# Patient Record
Sex: Female | Born: 1965 | Race: White | Hispanic: No | Marital: Married | Smoking: Never smoker
Health system: Southern US, Community
[De-identification: ages and names within clinical notes are randomized; demographics above are authoritative.]

## PROBLEM LIST (undated history)

## (undated) DIAGNOSIS — Z8739 Personal history of other diseases of the musculoskeletal system and connective tissue: Secondary | ICD-10-CM

## (undated) DIAGNOSIS — J45909 Unspecified asthma, uncomplicated: Secondary | ICD-10-CM

## (undated) DIAGNOSIS — E559 Vitamin D deficiency, unspecified: Secondary | ICD-10-CM

## (undated) DIAGNOSIS — R0602 Shortness of breath: Secondary | ICD-10-CM

## (undated) DIAGNOSIS — I10 Essential (primary) hypertension: Secondary | ICD-10-CM

## (undated) DIAGNOSIS — D649 Anemia, unspecified: Secondary | ICD-10-CM

## (undated) DIAGNOSIS — G4730 Sleep apnea, unspecified: Secondary | ICD-10-CM

## (undated) DIAGNOSIS — K219 Gastro-esophageal reflux disease without esophagitis: Secondary | ICD-10-CM

## (undated) DIAGNOSIS — K589 Irritable bowel syndrome without diarrhea: Secondary | ICD-10-CM

## (undated) DIAGNOSIS — M2550 Pain in unspecified joint: Secondary | ICD-10-CM

## (undated) DIAGNOSIS — E538 Deficiency of other specified B group vitamins: Secondary | ICD-10-CM

## (undated) DIAGNOSIS — K829 Disease of gallbladder, unspecified: Secondary | ICD-10-CM

## (undated) DIAGNOSIS — E669 Obesity, unspecified: Secondary | ICD-10-CM

## (undated) DIAGNOSIS — J349 Unspecified disorder of nose and nasal sinuses: Secondary | ICD-10-CM

## (undated) DIAGNOSIS — F419 Anxiety disorder, unspecified: Secondary | ICD-10-CM

## (undated) DIAGNOSIS — F32A Depression, unspecified: Secondary | ICD-10-CM

## (undated) DIAGNOSIS — M549 Dorsalgia, unspecified: Secondary | ICD-10-CM

## (undated) DIAGNOSIS — E7800 Pure hypercholesterolemia, unspecified: Secondary | ICD-10-CM

## (undated) DIAGNOSIS — M7989 Other specified soft tissue disorders: Secondary | ICD-10-CM

## (undated) HISTORY — PX: TONSILLECTOMY: SUR1361

## (undated) HISTORY — DX: Disease of gallbladder, unspecified: K82.9

## (undated) HISTORY — PX: CHOLECYSTECTOMY: SHX55

## (undated) HISTORY — DX: Irritable bowel syndrome, unspecified: K58.9

## (undated) HISTORY — DX: Gastro-esophageal reflux disease without esophagitis: K21.9

## (undated) HISTORY — DX: Obesity, unspecified: E66.9

## (undated) HISTORY — DX: Personal history of other diseases of the musculoskeletal system and connective tissue: Z87.39

## (undated) HISTORY — DX: Unspecified disorder of nose and nasal sinuses: J34.9

## (undated) HISTORY — DX: Essential (primary) hypertension: I10

## (undated) HISTORY — DX: Sleep apnea, unspecified: G47.30

## (undated) HISTORY — DX: Pain in unspecified joint: M25.50

## (undated) HISTORY — DX: Deficiency of other specified B group vitamins: E53.8

## (undated) HISTORY — DX: Unspecified asthma, uncomplicated: J45.909

## (undated) HISTORY — DX: Pure hypercholesterolemia, unspecified: E78.00

## (undated) HISTORY — DX: Anemia, unspecified: D64.9

## (undated) HISTORY — DX: Depression, unspecified: F32.A

## (undated) HISTORY — DX: Vitamin D deficiency, unspecified: E55.9

## (undated) HISTORY — DX: Dorsalgia, unspecified: M54.9

## (undated) HISTORY — DX: Shortness of breath: R06.02

## (undated) HISTORY — DX: Other specified soft tissue disorders: M79.89

## (undated) HISTORY — DX: Anxiety disorder, unspecified: F41.9

---

## 2005-02-04 ENCOUNTER — Other Ambulatory Visit: Payer: Self-pay

## 2005-02-04 ENCOUNTER — Emergency Department: Payer: Self-pay | Admitting: Emergency Medicine

## 2005-05-07 ENCOUNTER — Ambulatory Visit: Payer: Self-pay | Admitting: Family Medicine

## 2005-11-10 ENCOUNTER — Emergency Department: Payer: Self-pay | Admitting: Emergency Medicine

## 2006-07-29 HISTORY — PX: ABDOMINAL HYSTERECTOMY: SHX81

## 2007-04-08 ENCOUNTER — Ambulatory Visit: Payer: Self-pay | Admitting: Unknown Physician Specialty

## 2007-04-13 ENCOUNTER — Ambulatory Visit: Payer: Self-pay | Admitting: Unknown Physician Specialty

## 2007-10-30 ENCOUNTER — Ambulatory Visit: Payer: Self-pay | Admitting: Unknown Physician Specialty

## 2008-02-15 ENCOUNTER — Emergency Department: Payer: Self-pay | Admitting: Emergency Medicine

## 2008-11-17 ENCOUNTER — Emergency Department: Payer: Self-pay | Admitting: Emergency Medicine

## 2011-05-08 ENCOUNTER — Ambulatory Visit (INDEPENDENT_AMBULATORY_CARE_PROVIDER_SITE_OTHER): Payer: 59

## 2011-05-08 ENCOUNTER — Inpatient Hospital Stay (INDEPENDENT_AMBULATORY_CARE_PROVIDER_SITE_OTHER)
Admission: RE | Admit: 2011-05-08 | Discharge: 2011-05-08 | Disposition: A | Discharge status: Home / Self Care | Payer: 59 | Source: Ambulatory Visit | Attending: Family Medicine | Admitting: Family Medicine

## 2011-05-08 DIAGNOSIS — R0789 Other chest pain: Secondary | ICD-10-CM

## 2011-05-08 DIAGNOSIS — R319 Hematuria, unspecified: Secondary | ICD-10-CM

## 2011-05-08 LAB — POCT I-STAT, CHEM 8
BUN: 8 mg/dL (ref 6–23)
Calcium, Ion: 1.21 mmol/L (ref 1.12–1.32)
Hemoglobin: 16 g/dL — ABNORMAL HIGH (ref 12.0–15.0)
TCO2: 27 mmol/L (ref 0–100)

## 2011-05-08 LAB — POCT URINALYSIS DIP (DEVICE)
Ketones, ur: NEGATIVE mg/dL
Leukocytes, UA: NEGATIVE
Nitrite: NEGATIVE
Protein, ur: NEGATIVE mg/dL
Urobilinogen, UA: 0.2 mg/dL (ref 0.0–1.0)
pH: 5.5 (ref 5.0–8.0)

## 2011-05-24 ENCOUNTER — Ambulatory Visit (HOSPITAL_BASED_OUTPATIENT_CLINIC_OR_DEPARTMENT_OTHER)
Admission: RE | Admit: 2011-05-24 | Discharge: 2011-05-24 | Disposition: A | Discharge status: Home / Self Care | Payer: 59 | Source: Ambulatory Visit | Attending: Urology | Admitting: Urology

## 2011-05-24 DIAGNOSIS — N949 Unspecified condition associated with female genital organs and menstrual cycle: Secondary | ICD-10-CM | POA: Insufficient documentation

## 2011-05-24 LAB — POCT HEMOGLOBIN-HEMACUE: Hemoglobin: 14.2 g/dL (ref 12.0–15.0)

## 2011-05-29 NOTE — Op Note (Signed)
  Amanda Dixon, Amanda Dixon               ACCOUNT NO.:  [id number]  MEDICAL RECORD NO.:  [id number]  LOCATION:                               FACILITY:  [facility]  PHYSICIAN:  Martina Sinner, MD DATE OF BIRTH:  09/23/1965  DATE OF PROCEDURE:  05/24/2011 DATE OF DISCHARGE:                              OPERATIVE REPORT   PREOPERATIVE DIAGNOSIS:  Pelvic pain.  POSTOPERATIVE DIAGNOSIS:  Pelvic pain.  SURGERY:  Cystoscopy, bladder hydrodistention, bladder instillation therapy.  [name] has left lower quadrant pain.  There is a question of whether or not she had interstitial cystitis with adhesions and pain from her left ovary.  She has consented to the above procedure.  A 22-French scope was utilized.  Bladder mucosa and trigone were normal. There was no foreign body or carcinoma.  It was hydrodistended to 550 mL.  There is no __________ to the bladder.  I reinspected.  There are no findings of acute interstitial cystitis with no glomerulations.  As a sub-procedure, I instilled 15 mL of 0.5% Marcaine with 400 mg of Pyridium.  The patient was taken to recovery room.  In my opinion, she is not have interstitial cystitis.          ______________________________ Martina Sinner, MD     SAM/MEDQ  D:  05/24/2011  T:  05/24/2011  Job:  [id number]  Electronically Signed by Alfredo Martinez MD on 05/29/2011 05:20:18 PM

## 2011-10-02 ENCOUNTER — Encounter: Payer: Self-pay | Admitting: Obstetrics & Gynecology

## 2011-10-28 ENCOUNTER — Encounter: Payer: Self-pay | Admitting: Obstetrics & Gynecology

## 2013-01-26 ENCOUNTER — Ambulatory Visit: Payer: Self-pay | Admitting: Otolaryngology

## 2013-02-09 ENCOUNTER — Institutional Professional Consult (permissible substitution): Payer: 59 | Admitting: Pulmonary Disease

## 2013-02-19 ENCOUNTER — Encounter: Payer: Self-pay | Admitting: Pulmonary Disease

## 2013-02-22 ENCOUNTER — Ambulatory Visit (INDEPENDENT_AMBULATORY_CARE_PROVIDER_SITE_OTHER)
Admission: RE | Admit: 2013-02-22 | Discharge: 2013-02-22 | Disposition: A | Discharge status: Home / Self Care | Payer: 59 | Source: Ambulatory Visit | Attending: Pulmonary Disease | Admitting: Pulmonary Disease

## 2013-02-22 ENCOUNTER — Ambulatory Visit (INDEPENDENT_AMBULATORY_CARE_PROVIDER_SITE_OTHER): Payer: 59 | Admitting: Pulmonary Disease

## 2013-02-22 ENCOUNTER — Encounter: Payer: Self-pay | Admitting: Pulmonary Disease

## 2013-02-22 ENCOUNTER — Other Ambulatory Visit (INDEPENDENT_AMBULATORY_CARE_PROVIDER_SITE_OTHER): Payer: 59

## 2013-02-22 VITALS — BP 124/90 | HR 70 | Temp 98.4°F | Ht 65.5 in | Wt 213.6 lb

## 2013-02-22 DIAGNOSIS — R0602 Shortness of breath: Secondary | ICD-10-CM

## 2013-02-22 DIAGNOSIS — R053 Chronic cough: Secondary | ICD-10-CM

## 2013-02-22 DIAGNOSIS — R05 Cough: Secondary | ICD-10-CM | POA: Insufficient documentation

## 2013-02-22 HISTORY — DX: Chronic cough: R05.3

## 2013-02-22 LAB — CBC WITH DIFFERENTIAL/PLATELET
Basophils Absolute: 0.1 10*3/uL (ref 0.0–0.1)
HCT: 42 % (ref 36.0–46.0)
Lymphs Abs: 3.1 10*3/uL (ref 0.7–4.0)
Monocytes Absolute: 0.5 10*3/uL (ref 0.1–1.0)
Monocytes Relative: 5.1 % (ref 3.0–12.0)
Neutrophils Relative %: 59.7 % (ref 43.0–77.0)
Platelets: 322 10*3/uL (ref 150.0–400.0)
RDW: 14.1 % (ref 11.5–14.6)

## 2013-02-22 MED ORDER — TRIAMCINOLONE ACETONIDE(NASAL) 55 MCG/ACT NA INHA: 2.0000 | Freq: Every day | NASAL | Status: AC
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
## 2013-02-22 NOTE — Assessment & Plan Note (Addendum)
I agree with Dr. Andee Poles that the recent nasty episode of cough was related to bronchitis as it improved with antibiotics.    However, the chronic, lingering cough (which seems somewhat occassional and not constant and daily) is likely due to her post nasal drip and acid reflux.  I suspect she has some component of allergic rhinitis.  Plan: -advised saline rinses, OTC Nasacort, chlotrimeton -GERD lifestyle modification and PPI if no improvement

## 2013-02-22 NOTE — Patient Instructions (Addendum)
For the cough: Use saline sprays with distilled water at least twice per day using the instructions on the package. 1/2 hour after using the Saline spray rinse, use Nasacort two puffs in each nostril once per day. Use chlortrimeton and an over the counter decongestant (ask the pharmacist for a recommendation) as needed for the cough. If you are not better in 2 weeks, then start taking Prilosec 20mg  oral daily.  Follow the reflux lifestyle modification sheet we provided you with  We will call you with the results of the Chest X-ray, pulmonary function test, and the blood work.  We will see you back in one month

## 2013-03-30 ENCOUNTER — Ambulatory Visit (INDEPENDENT_AMBULATORY_CARE_PROVIDER_SITE_OTHER): Payer: 59 | Admitting: Pulmonary Disease

## 2013-03-30 ENCOUNTER — Encounter: Payer: Self-pay | Admitting: Pulmonary Disease

## 2013-03-30 VITALS — BP 126/74 | HR 87 | Temp 98.7°F | Ht 65.0 in | Wt 214.0 lb

## 2013-03-30 DIAGNOSIS — R05 Cough: Secondary | ICD-10-CM

## 2013-03-30 DIAGNOSIS — R0602 Shortness of breath: Secondary | ICD-10-CM

## 2013-03-30 DIAGNOSIS — R059 Cough, unspecified: Secondary | ICD-10-CM

## 2013-03-30 LAB — PULMONARY FUNCTION TEST

## 2013-03-30 NOTE — Assessment & Plan Note (Signed)
Resolved.  I think that this was related to post nasal drip and cyclical cough.    Plan: -nasacort daily -Given the change in her small airways on PFT's with albuterol, using an inhaled steroid during episodes of prolonged cough is reasonable -if cough symptoms become worse, consider methacholine challenge testing to rule out asthma

## 2013-03-30 NOTE — Progress Notes (Signed)
  Subjective:    Patient ID: Amanda Dixon, female    DOB: 1965-08-23, 47 y.o.   MRN: [id number]  Synopsis: Amanda Dixon saw the [facility] pulmonary clinic in the summer of 2014 for shortness of breath and bronchitis. She had significant allergic rhinitis. Pulmonary function testing and chest x-ray was completely normal. It was determined that the most likely etiology of her shortness of breath was obesity and deconditioning.  HPI  03/30/2013 ROV >> Amanda Dixon states that since the last visit she has not been working out because she's been too busy with work and family issues. Her cough has improved. However continues to make her have some shortness of breath. She did the steroid nasal spray at home for several weeks and this improved her postnasal drip and cough. However she stopped using it a couple weeks ago her cough is come back a little bit. She does have mucus production. She does have ongoing sinus congestion. Her shortness of breath is at baseline.  Past Medical History  Diagnosis Date  . Sinus trouble     Review of Systems  Constitutional: Negative for fever, chills and fatigue.  HENT: Positive for congestion and postnasal drip. Negative for nosebleeds and rhinorrhea.   Respiratory: Positive for shortness of breath. Negative for cough and wheezing.   Cardiovascular: Negative for chest pain, palpitations and leg swelling.       Objective:   Physical Exam  Filed Vitals:   03/30/13 1644  BP: 126/74  Pulse: 87  Temp: 98.7 F (37.1 C)  TempSrc: Oral  Height: 5\' 5"  (1.651 m)  Weight: 214 lb (97.07 kg)  SpO2: 98%    Gen: well appearing, no acute distress HEENT: NCAT, EOMi, OP clear PULM: CTA B CV: RRR, no mgr, no JVD AB: soft, nontender, no hsm Ext: warm, no edema, no clubbing, no cyanosis      Assessment & Plan:   Shortness of breath Based on her normal oximetry, CXR, and PFT's, I don't see clear evidence of lung disease.  The most likely explanation for her  dyspnea is obesity and deconditioning.   Her change in small airways disease was dramatic, but certainly not diagnostic of asthma.  Plan: -exercise regularly -if she gets a cold, use symptomatic therapy early -weight loss -f/u if no improvement with weight loss and exercise  Cough Resolved.  I think that this was related to post nasal drip and cyclical cough.    Plan: -nasacort daily -Given the change in her small airways on PFT's with albuterol, using an inhaled steroid during episodes of prolonged cough is reasonable -if cough symptoms become worse, consider methacholine challenge testing to rule out asthma   Updated Medication List Outpatient Encounter Prescriptions as of 03/30/2013  Medication Sig Dispense Refill  . albuterol (PROVENTIL HFA;VENTOLIN HFA) 108 (90 BASE) MCG/ACT inhaler Inhale 2 puffs into the lungs every 6 (six) hours as needed for wheezing.      . triamcinolone (NASACORT AQ) 55 MCG/ACT nasal inhaler Place 2 sprays into the nose daily.  1 Inhaler  0   No facility-administered encounter medications on file as of 03/30/2013.

## 2013-03-30 NOTE — Progress Notes (Signed)
PFT done today. 

## 2013-03-30 NOTE — Patient Instructions (Signed)
Take the Nasacort two puffs daily to each nostril Exercise regulary and try to lose weight  When you get a cold, take cough medicine and cold medicine as soon as possible to help control your symptoms  We will see you back if your symptoms get worse or don't improve

## 2013-03-30 NOTE — Assessment & Plan Note (Signed)
Based on her normal oximetry, CXR, and PFT's, I don't see clear evidence of lung disease.  The most likely explanation for her dyspnea is obesity and deconditioning.   Her change in small airways disease was dramatic, but certainly not diagnostic of asthma.  Plan: -exercise regularly -if she gets a cold, use symptomatic therapy early -weight loss -f/u if no improvement with weight loss and exercise

## 2013-06-16 ENCOUNTER — Encounter: Payer: Self-pay | Admitting: Pulmonary Disease

## 2014-07-05 ENCOUNTER — Ambulatory Visit (INDEPENDENT_AMBULATORY_CARE_PROVIDER_SITE_OTHER): Payer: 59 | Admitting: Psychology

## 2014-07-05 DIAGNOSIS — F4323 Adjustment disorder with mixed anxiety and depressed mood: Secondary | ICD-10-CM

## 2014-07-14 ENCOUNTER — Ambulatory Visit: Payer: 59 | Admitting: Psychology

## 2014-08-02 ENCOUNTER — Ambulatory Visit: Payer: 59 | Admitting: Psychology

## 2015-03-23 ENCOUNTER — Emergency Department
Admission: EM | Admit: 2015-03-23 | Discharge: 2015-03-23 | Disposition: A | Discharge status: Home / Self Care | Payer: Commercial Managed Care - HMO | Attending: Student | Admitting: Student

## 2015-03-23 ENCOUNTER — Emergency Department: Payer: Commercial Managed Care - HMO

## 2015-03-23 ENCOUNTER — Encounter: Payer: Self-pay | Admitting: *Deleted

## 2015-03-23 DIAGNOSIS — Z7951 Long term (current) use of inhaled steroids: Secondary | ICD-10-CM | POA: Insufficient documentation

## 2015-03-23 DIAGNOSIS — Y998 Other external cause status: Secondary | ICD-10-CM | POA: Insufficient documentation

## 2015-03-23 DIAGNOSIS — S80211A Abrasion, right knee, initial encounter: Secondary | ICD-10-CM

## 2015-03-23 DIAGNOSIS — Y9389 Activity, other specified: Secondary | ICD-10-CM | POA: Diagnosis not present

## 2015-03-23 DIAGNOSIS — S8001XA Contusion of right knee, initial encounter: Secondary | ICD-10-CM | POA: Diagnosis not present

## 2015-03-23 DIAGNOSIS — Y92009 Unspecified place in unspecified non-institutional (private) residence as the place of occurrence of the external cause: Secondary | ICD-10-CM | POA: Diagnosis not present

## 2015-03-23 DIAGNOSIS — W01198A Fall on same level from slipping, tripping and stumbling with subsequent striking against other object, initial encounter: Secondary | ICD-10-CM | POA: Insufficient documentation

## 2015-03-23 DIAGNOSIS — S8991XA Unspecified injury of right lower leg, initial encounter: Secondary | ICD-10-CM | POA: Diagnosis present

## 2015-03-23 DIAGNOSIS — W19XXXA Unspecified fall, initial encounter: Secondary | ICD-10-CM

## 2015-03-23 MED ORDER — CYCLOBENZAPRINE HCL 10 MG PO TABS
10.0000 mg | ORAL_TABLET | Freq: Once | ORAL | Status: AC
  Administered 2015-03-23: 10 mg via ORAL

## 2015-03-23 MED ORDER — HYDROCODONE-ACETAMINOPHEN 5-325 MG PO TABS
ORAL_TABLET | ORAL | Status: AC
  Administered 2015-03-23: 1 via ORAL
  Filled 2015-03-23: qty 1

## 2015-03-23 MED ORDER — HYDROCODONE-ACETAMINOPHEN 5-325 MG PO TABS
1.0000 | ORAL_TABLET | Freq: Once | ORAL | Status: AC
Start: 2015-03-23 — End: 2015-03-23
  Administered 2015-03-23: 1 via ORAL

## 2015-03-23 MED ORDER — CYCLOBENZAPRINE HCL 5 MG PO TABS: 5.0000 mg | ORAL_TABLET | Freq: Three times a day (TID) | ORAL | Status: DC | PRN

## 2015-03-23 MED ORDER — HYDROCODONE-ACETAMINOPHEN 5-325 MG PO TABS: 1.0000 | ORAL_TABLET | Freq: Three times a day (TID) | ORAL | Status: DC | PRN

## 2015-03-23 MED ORDER — CYCLOBENZAPRINE HCL 10 MG PO TABS
ORAL_TABLET | ORAL | Status: AC
  Filled 2015-03-23: qty 1

## 2015-03-23 NOTE — ED Notes (Signed)
Patient with no complaints at this time. Respirations even and unlabored. Skin warm/dry. Discharge instructions reviewed with patient at this time. Patient given opportunity to voice concerns/ask questions. Patient discharged at this time and left Emergency Department, via wheelchair.   

## 2015-03-23 NOTE — ED Provider Notes (Signed)
[facility] Emergency Department Provider Note ____________________________________________  Time seen: 2310  I have reviewed the triage vital signs and the nursing notes.  HISTORY  Chief Complaint  Knee Pain  HPI Amanda Dixon is a 49 y.o. female reports to the EDfor treatment of injury sustained after she fell at home today. She states she tripped over her dog and hit the floor falling on both knees. She states that the right knee is tender and swollen and painful to bear weight. She does note some superficial tenderness to the left knee. She denies any other injury at the time of her fall. She now is experiencing some mild muscle tightness in the right quad muscle and into the groin. She denies any bladder or bowel incontinence or leg weakness. She rates her pain at 8/10 in triage.  Past Medical History  Diagnosis Date  . Sinus trouble     Patient Active Problem List   Diagnosis Date Noted  . Shortness of breath 02/22/2013  . Cough 02/22/2013    Past Surgical History  Procedure Laterality Date  . Cholecystectomy  1996/97?  Marland Kitchen Abdominal hysterectomy  2008  . Tonsillectomy    . Cesarean section      Current Outpatient Rx  Name  Route  Sig  Dispense  Refill  . albuterol (PROVENTIL HFA;VENTOLIN HFA) 108 (90 BASE) MCG/ACT inhaler   Inhalation   Inhale 2 puffs into the lungs every 6 (six) hours as needed for wheezing.         . cyclobenzaprine (FLEXERIL) 5 MG tablet   Oral   Take 1 tablet (5 mg total) by mouth every 8 (eight) hours as needed for muscle spasms.   12 tablet   0   . HYDROcodone-acetaminophen (NORCO) 5-325 MG per tablet   Oral   Take 1 tablet by mouth every 8 (eight) hours as needed for moderate pain.   10 tablet   0   . triamcinolone (NASACORT AQ) 55 MCG/ACT nasal inhaler   Nasal   Place 2 sprays into the nose daily.   1 Inhaler   0    Allergies Review of patient's allergies indicates no known allergies.  Family  History  Problem Relation Age of Onset  . Heart disease Maternal Grandfather   . Heart disease Paternal Grandfather   . Heart disease Father     multpile bypass  . Emphysema Maternal Grandfather   . Emphysema Paternal Grandmother   . Allergies Sister   . Allergies Son   . Allergies Mother   . Asthma Maternal Grandfather   . Allergies Other     mothers side  . Asthma Other     father side   Social History Social History  Substance Use Topics  . Smoking status: Passive Smoke Exposure - Never Smoker  . Smokeless tobacco: None     Comment: NEVER SMOKER:  EXPOSED TO 2ND(HUSBAND) X SEVERAL YEARS.   Marland Kitchen Alcohol Use: No   Review of Systems  Constitutional: Negative for fever. Eyes: Negative for visual changes. ENT: Negative for sore throat. Cardiovascular: Negative for chest pain. Respiratory: Negative for shortness of breath. Gastrointestinal: Negative for abdominal pain, vomiting and diarrhea. Genitourinary: Negative for dysuria. Musculoskeletal: Negative for back pain. Right knee pain as above Skin: Negative for rash. Neurological: Negative for headaches, focal weakness or numbness. ____________________________________________  PHYSICAL EXAM:  VITAL SIGNS: ED Triage Vitals  Enc Vitals Group     BP 03/23/15 2238 95/72 mmHg     Pulse  Rate 03/23/15 2238 69     Resp 03/23/15 2238 18     Temp 03/23/15 2238 98.4 F (36.9 C)     Temp Source 03/23/15 2238 Oral     SpO2 03/23/15 2238 98 %     Weight 03/23/15 2238 210 lb (95.255 kg)     Height 03/23/15 2238  (1.651 m)     Head Cir --      Peak Flow --      Pain Score 03/23/15 2240 8     Pain Loc --      Pain Edu? --      Excl. in GC? --    Constitutional: Alert and oriented. Well appearing and in no distress. Eyes: Conjunctivae are normal. PERRL. Normal extraocular movements. ENT   Head: Normocephalic and atraumatic.   Nose: No congestion/rhinnorhea.   Mouth/Throat: Mucous membranes are moist.   Neck:  Supple. No thyromegaly. Hematological/Lymphatic/Immunilogical: No cervical lymphadenopathy. Cardiovascular: Normal rate, regular rhythm.  Respiratory: Normal respiratory effort. No wheezes/rales/rhonchi. Gastrointestinal: Soft and nontender. No distention. Musculoskeletal: Nontender with normal range of motion in all extremities. Right knee with superficial abrasion over the patella. Normal patella tracking without ballotment. No valgus or varus joint pain. Normal quad action. No calf or achilles tenderness. Normal ankle exam Neurologic:  Normal gait without ataxia. Normal speech and language. No gross focal neurologic deficits are appreciated. Skin:  Skin is warm, dry and intact. No rash noted. Psychiatric: Mood and affect are normal. Patient exhibits appropriate insight and judgment. ____________________________________________   RADIOLOGY Right Knee IMPRESSION: Negative radiographs of the right knee.  I, Taneshia Lorence, Charlesetta Ivory, personally viewed and evaluated these images (plain radiographs) as part of my medical decision making.  ____________________________________________  PROCEDURES  Ace bandage Norco 5-325 mg PO Flexeril 10 mg PO ____________________________________________  INITIAL IMPRESSION / ASSESSMENT AND PLAN / ED COURSE  Mechanical fall causing right knee contusion and abrasion. Treatment with Flexeril and Norco #10. Follow-up with [facility] as needed.  ____________________________________________  FINAL CLINICAL IMPRESSION(S) / ED DIAGNOSES  Final diagnoses:  Fall, initial encounter  Knee contusion, right, initial encounter  Abrasion of knee, right, initial encounter     Lissa Hoard, PA-C 03/23/15 2345  Gayla Doss, MD 03/24/15 [phone]

## 2015-03-23 NOTE — ED Notes (Signed)
Patient states she tripped over her dog and hit the floor with her knees. Patient states right knee is swollen, painful and unable to weight-bear. Patient states left knee is just painful from hitting the floor.

## 2015-03-23 NOTE — Discharge Instructions (Signed)
Contusion A contusion is a deep bruise. Contusions happen when an injury causes bleeding under the skin. Signs of bruising include pain, puffiness (swelling), and discolored skin. The contusion may turn blue, purple, or yellow. HOME CARE   Put ice on the injured area.  Put ice in a plastic bag.  Place a towel between your skin and the bag.  Leave the ice on for 15-20 minutes, 03-04 times a day.  Only take medicine as told by your doctor.  Rest the injured area.  If possible, raise (elevate) the injured area to lessen puffiness. GET HELP RIGHT AWAY IF:   You have more bruising or puffiness.  You have pain that is getting worse.  Your puffiness or pain is not helped by medicine. MAKE SURE YOU:   Understand these instructions.  Will watch your condition.  Will get help right away if you are not doing well or get worse. Document Released: 01/01/2008 Document Revised: 10/07/2011 Document Reviewed: 05/20/2011 [facility] Patient Information 2015 [place], [place]. This information is not intended to replace advice given to you by your health care provider. Make sure you discuss any questions you have with your health care provider.  Abrasions An abrasion is a cut or scrape of the skin. Abrasions do not go through all layers of the skin. HOME CARE  If a bandage (dressing) was put on your wound, change it as told by your doctor. If the bandage sticks, soak it off with warm.  Wash the area with water and soap 2 times a day. Rinse off the soap. Pat the area dry with a clean towel.  Put on medicated cream (ointment) as told by your doctor.  Change your bandage right away if it gets wet or dirty.  Only take medicine as told by your doctor.  See your doctor within 24-48 hours to get your wound checked.  Check your wound for redness, puffiness (swelling), or yellowish-white fluid (pus). GET HELP RIGHT AWAY IF:   You have more pain in the wound.  You have redness, swelling, or  tenderness around the wound.  You have pus coming from the wound.  You have a fever or lasting symptoms for more than 2-3 days.  You have a fever and your symptoms suddenly get worse.  You have a bad smell coming from the wound or bandage. MAKE SURE YOU:   Understand these instructions.  Will watch your condition.  Will get help right away if you are not doing well or get worse. Document Released: 01/01/2008 Document Revised: 04/08/2012 Document Reviewed: 06/18/2011 [facility] Patient Information 2015 [place], [place]. This information is not intended to replace advice given to you by your health care provider. Make sure you discuss any questions you have with your health care provider.  Wear the ace bandage as needed for support.  Apply ice to reduce pain and swelling.  Follow-up with [facility] as needed. Take the pain meds as needed.

## 2015-04-11 ENCOUNTER — Encounter (HOSPITAL_COMMUNITY): Payer: Self-pay | Admitting: Emergency Medicine

## 2015-04-11 ENCOUNTER — Emergency Department (HOSPITAL_COMMUNITY)
Admission: EM | Admit: 2015-04-11 | Discharge: 2015-04-11 | Disposition: A | Discharge status: Home / Self Care | Payer: Commercial Managed Care - HMO | Source: Home / Self Care | Attending: Emergency Medicine | Admitting: Emergency Medicine

## 2015-04-11 DIAGNOSIS — L03115 Cellulitis of right lower limb: Secondary | ICD-10-CM | POA: Diagnosis not present

## 2015-04-11 DIAGNOSIS — T63891A Toxic effect of contact with other venomous animals, accidental (unintentional), initial encounter: Secondary | ICD-10-CM | POA: Diagnosis not present

## 2015-04-11 DIAGNOSIS — T63481A Toxic effect of venom of other arthropod, accidental (unintentional), initial encounter: Secondary | ICD-10-CM

## 2015-04-11 MED ORDER — CEPHALEXIN 500 MG PO CAPS: 500.0000 mg | ORAL_CAPSULE | Freq: Four times a day (QID) | ORAL | Status: DC

## 2015-04-11 NOTE — ED Provider Notes (Signed)
CSN: [id number]     Arrival date & time 04/11/15  1534 History   First MD Initiated Contact with Patient 04/11/15 1602     Chief Complaint  Patient presents with  . Insect Bite   (Consider location/radiation/quality/duration/timing/severity/associated sxs/prior Treatment) HPI  She is a 49 year old woman here for evaluation of right calf pain. She reports being stunned by a wasp about 10 days ago on the right calf. She reports a history of local reaction to wasp bites. However, she states usually it gets better after a few days. This is been getting worse, particularly over the last 2 days. She states it is tight and warm. It also itches sometimes. She denies any fevers or chills. No nausea or vomiting.  Past Medical History  Diagnosis Date  . Sinus trouble    Past Surgical History  Procedure Laterality Date  . Cholecystectomy  1996/97?  Marland Kitchen Abdominal hysterectomy  2008  . Tonsillectomy    . Cesarean section     Family History  Problem Relation Age of Onset  . Heart disease Maternal Grandfather   . Heart disease Paternal Grandfather   . Heart disease Father     multpile bypass  . Emphysema Maternal Grandfather   . Emphysema Paternal Grandmother   . Allergies Sister   . Allergies Son   . Allergies Mother   . Asthma Maternal Grandfather   . Allergies Other     mothers side  . Asthma Other     father side   Social History  Substance Use Topics  . Smoking status: Passive Smoke Exposure - Never Smoker  . Smokeless tobacco: None     Comment: NEVER SMOKER:  EXPOSED TO 2ND(HUSBAND) X SEVERAL YEARS.   Marland Kitchen Alcohol Use: No   OB History    No data available     Review of Systems As in history of present illness Allergies  Review of patient's allergies indicates no known allergies.  Home Medications   Prior to Admission medications   Medication Sig Start Date End Date Taking? Authorizing Provider  PARoxetine (PAXIL) 10 MG tablet Take 10 mg by mouth daily.   Yes Historical  Provider, MD  albuterol (PROVENTIL HFA;VENTOLIN HFA) 108 (90 BASE) MCG/ACT inhaler Inhale 2 puffs into the lungs every 6 (six) hours as needed for wheezing.    Historical Provider, MD  cephALEXin (KEFLEX) 500 MG capsule Take 1 capsule (500 mg total) by mouth 4 (four) times daily. 04/11/15   Charm Rings, MD  cyclobenzaprine (FLEXERIL) 5 MG tablet Take 1 tablet (5 mg total) by mouth every 8 (eight) hours as needed for muscle spasms. 03/23/15   Jenise V Bacon Menshew, PA-C  HYDROcodone-acetaminophen (NORCO) 5-325 MG per tablet Take 1 tablet by mouth every 8 (eight) hours as needed for moderate pain. 03/23/15   Jenise V Bacon Menshew, PA-C  triamcinolone (NASACORT AQ) 55 MCG/ACT nasal inhaler Place 2 sprays into the nose daily. 02/22/13   Lupita Leash, MD   Meds Ordered and Administered this Visit  Medications - No data to display  BP 125/60 mmHg  Pulse 75  Temp(Src) 97.7 F (36.5 C) (Oral)  Resp 16  SpO2 100% No data found.   Physical Exam  Constitutional: She is oriented to person, place, and time. She appears well-developed and well-nourished. No distress.  Cardiovascular: Normal rate.   Pulmonary/Chest: Effort normal.  Musculoskeletal:  Right calf: There is a 2 mm blister at the site of the wasp bite. There is about 4 cm  of surrounding erythema. This area is tense and warm.  Neurological: She is alert and oriented to person, place, and time.    ED Course  Procedures (including critical care time)  Labs Review Labs Reviewed - No data to display  Imaging Review No results found.    MDM   1. Cellulitis of right lower extremity   2. Insect sting, accidental or unintentional, initial encounter    Likely secondarily infected insect bite. Treat with Keflex for one week. Return precautions reviewed.    Charm Rings, MD 04/11/15 1710

## 2015-04-11 NOTE — ED Notes (Signed)
Pt was stung by a wasp on her right calf about ten days ago.  The origin of the sting is red and hard and causes some pain when walking.  She also reports what she thinks was a mosquito bite last night on her left thigh and it is now red and swollen, and looks a lot like her calf does.  She denies any fever.

## 2015-04-11 NOTE — Discharge Instructions (Signed)
It looks like the wasp sting has gotten infected. Take Keflex 4 times a day for 7 days. You can apply ice to help with the swelling. Use over-the-counter hydrocortisone cream for any itching. You should see improvement in the next 2-3 days. Follow-up as needed.

## 2016-10-07 ENCOUNTER — Encounter: Payer: Self-pay | Admitting: Obstetrics and Gynecology

## 2016-10-07 ENCOUNTER — Ambulatory Visit (INDEPENDENT_AMBULATORY_CARE_PROVIDER_SITE_OTHER): Payer: 59 | Admitting: Obstetrics and Gynecology

## 2016-10-07 VITALS — BP 124/78 | HR 92 | Ht 65.0 in | Wt 231.0 lb

## 2016-10-07 DIAGNOSIS — F419 Anxiety disorder, unspecified: Secondary | ICD-10-CM | POA: Diagnosis not present

## 2016-10-07 DIAGNOSIS — E669 Obesity, unspecified: Secondary | ICD-10-CM | POA: Diagnosis not present

## 2016-10-07 DIAGNOSIS — Z01419 Encounter for gynecological examination (general) (routine) without abnormal findings: Secondary | ICD-10-CM | POA: Diagnosis not present

## 2016-10-07 DIAGNOSIS — J45990 Exercise induced bronchospasm: Secondary | ICD-10-CM

## 2016-10-07 DIAGNOSIS — J302 Other seasonal allergic rhinitis: Secondary | ICD-10-CM | POA: Diagnosis not present

## 2016-10-07 DIAGNOSIS — Z1231 Encounter for screening mammogram for malignant neoplasm of breast: Secondary | ICD-10-CM

## 2016-10-07 DIAGNOSIS — Z1239 Encounter for other screening for malignant neoplasm of breast: Secondary | ICD-10-CM

## 2016-10-07 DIAGNOSIS — J45909 Unspecified asthma, uncomplicated: Secondary | ICD-10-CM | POA: Insufficient documentation

## 2016-10-07 DIAGNOSIS — F411 Generalized anxiety disorder: Secondary | ICD-10-CM | POA: Insufficient documentation

## 2016-10-07 MED ORDER — PAROXETINE HCL 10 MG PO TABS
10.0000 mg | ORAL_TABLET | Freq: Every day | ORAL | 11 refills | Status: DC
Start: 2016-10-07 — End: 2017-11-10

## 2016-10-07 MED ORDER — ALBUTEROL SULFATE HFA 108 (90 BASE) MCG/ACT IN AERS: 2.0000 | INHALATION_SPRAY | Freq: Four times a day (QID) | RESPIRATORY_TRACT | 2 refills | Status: DC | PRN

## 2016-10-07 NOTE — Progress Notes (Signed)
HPI:      [name] is a [age] y.o. G1P1001 who LMP was No LMP recorded. Patient has had a hysterectomy., presents today for her annual examination.  Her menses are absent due to supracx hyst. She does not have intermenstrual bleeding.  She does have vasomotor sx. She takes paxil for anxiety which may be helping sx.  She is single partner, contraception - status post hysterectomy. She does not have vaginal dryness.  Last Pap: July 27, 2015  Results were: no abnormalities /neg HPV DNA.  Hx of STDs: none  Last mammogram: July 27, 2015  Results were: normal--routine follow-up in 12 months There is no FH of breast cancer. There is no FH of ovarian cancer. The patient does do self-breast exams.  Colonoscopy: colonoscopy 2 years ago without abnormalities.   Tobacco use: none Alcohol use: none Exercise: not active  She does get adequate calcium and Vitamin D in her diet.  She had neg screening labs 2017.   She has anxiety/mood changes which are controlled with paxil 10 mg. No side effects. She tried to wean off but had sx again.  She needs a RF on her inhalers for exercise induced asthma/allergies. She is finding a new PCP.  Patient Active Problem List   Diagnosis Date Noted  . Mild exercise-induced asthma 10/07/2016  . Seasonal allergies 10/07/2016  . Obesity (BMI 35.0-39.9 without comorbidity) 10/07/2016  . Anxiety 10/07/2016  . Shortness of breath 02/22/2013  . Cough 02/22/2013     Past Surgical History:  Procedure Laterality Date  . ABDOMINAL HYSTERECTOMY  2008   supracervical  . CESAREAN SECTION    . CHOLECYSTECTOMY  1996/97?  . TONSILLECTOMY      Family History  Problem Relation Age of Onset  . Heart disease Maternal Grandfather   . Emphysema Maternal Grandfather   . Asthma Maternal Grandfather   . Heart disease Paternal Grandfather   . Heart disease Father     multpile bypass  . Emphysema Paternal Grandmother   . Allergies Sister   .  Allergies Son   . Allergies Mother   . Allergies Other     mothers side  . Asthma Other     father side     ROS:  Review of Systems  Constitutional: Negative for fever, malaise/fatigue and weight loss.  HENT: Negative for congestion, ear pain and sinus pain.   Respiratory: Negative for cough, shortness of breath and wheezing.   Cardiovascular: Negative for chest pain, orthopnea and leg swelling.  Gastrointestinal: Negative for constipation, diarrhea, nausea and vomiting.  Genitourinary: Negative for dysuria, frequency, hematuria and urgency.       Breast ROS: negative   Musculoskeletal: Negative for back pain, joint pain and myalgias.  Skin: Negative for itching and rash.  Neurological: Negative for dizziness, tingling, focal weakness and headaches.  Endo/Heme/Allergies: Negative for environmental allergies. Does not bruise/bleed easily.  Psychiatric/Behavioral: Negative for depression and suicidal ideas. The patient is not nervous/anxious and does not have insomnia.     Objective: BP 124/78 (Patient Position: Sitting)   Pulse 92   Ht 5\' 5"  (1.651 m)   Wt 231 lb (104.8 kg)   BMI 38.44 kg/m    Physical Exam  Constitutional: She is oriented to person, place, and time. She appears well-developed and well-nourished.  Genitourinary: Vagina normal and uterus normal. No erythema or tenderness in the vagina. No vaginal discharge found. Right adnexum does not display mass and does not display tenderness. Left  adnexum does not display mass and does not display tenderness. Cervix does not exhibit motion tenderness or polyp. Uterus is not enlarged or tender.  Neck: Normal range of motion. No thyromegaly present.  Cardiovascular: Normal rate, regular rhythm and normal heart sounds.   No murmur heard. Pulmonary/Chest: Effort normal and breath sounds normal. Right breast exhibits no mass, no nipple discharge, no skin change and no tenderness. Left breast exhibits no mass, no nipple  discharge, no skin change and no tenderness.  Abdominal: Soft. There is no tenderness. There is no guarding.  Musculoskeletal: Normal range of motion.  Neurological: She is alert and oriented to person, place, and time. No cranial nerve deficit.  Psychiatric: She has a normal mood and affect. Her behavior is normal.  Vitals reviewed.    Assessment/Plan:  Encounter for annual routine gynecological examination  Mild exercise-induced asthma - Rx RF inhalers.  - Plan: albuterol (PROVENTIL HFA;VENTOLIN HFA) 108 (90 Base) MCG/ACT inhaler  Acute seasonal allergic rhinitis, unspecified trigger  Obesity (BMI 35.0-39.9 without comorbidity) - Pt has returned to bariatric clinic.  Anxiety - Rx RF paxil. F/u prn sx. - Plan: PARoxetine (PAXIL) 10 MG tablet  Screening for breast cancer - Plan: MM DIGITAL SCREENING BILATERAL  GYN counsel mammography screening, adequate intake of calcium and vitamin D     F/U  Return in about 1 year (around 10/07/2017).  Amanda Stodghill B. Shawnika Pepin, PA-C 10/07/2016 9:02 AM

## 2016-12-19 ENCOUNTER — Ambulatory Visit (INDEPENDENT_AMBULATORY_CARE_PROVIDER_SITE_OTHER): Payer: 59 | Admitting: Pulmonary Disease

## 2016-12-19 ENCOUNTER — Other Ambulatory Visit (INDEPENDENT_AMBULATORY_CARE_PROVIDER_SITE_OTHER): Payer: 59

## 2016-12-19 ENCOUNTER — Ambulatory Visit (INDEPENDENT_AMBULATORY_CARE_PROVIDER_SITE_OTHER)
Admission: RE | Admit: 2016-12-19 | Discharge: 2016-12-19 | Disposition: A | Discharge status: Home / Self Care | Payer: 59 | Source: Ambulatory Visit | Attending: Pulmonary Disease | Admitting: Pulmonary Disease

## 2016-12-19 ENCOUNTER — Encounter: Payer: Self-pay | Admitting: Pulmonary Disease

## 2016-12-19 VITALS — BP 118/72 | HR 75 | Ht 65.0 in | Wt 232.0 lb

## 2016-12-19 DIAGNOSIS — R0602 Shortness of breath: Secondary | ICD-10-CM

## 2016-12-19 DIAGNOSIS — R05 Cough: Secondary | ICD-10-CM | POA: Diagnosis not present

## 2016-12-19 DIAGNOSIS — G4710 Hypersomnia, unspecified: Secondary | ICD-10-CM | POA: Insufficient documentation

## 2016-12-19 DIAGNOSIS — R059 Cough, unspecified: Secondary | ICD-10-CM

## 2016-12-19 LAB — BASIC METABOLIC PANEL
BUN: 9 mg/dL (ref 6–23)
CHLORIDE: 102 meq/L (ref 96–112)
CO2: 31 meq/L (ref 19–32)
CREATININE: 0.61 mg/dL (ref 0.40–1.20)
Calcium: 9.1 mg/dL (ref 8.4–10.5)
GFR: 109.89 mL/min (ref >60.00)
Glucose, Bld: 102 mg/dL — ABNORMAL HIGH (ref 70–99)
POTASSIUM: 3.6 meq/L (ref 3.5–5.1)
Sodium: 138 mEq/L (ref 135–145)

## 2016-12-19 LAB — CBC WITH DIFFERENTIAL/PLATELET
BASOS ABS: 0.2 10*3/uL — AB (ref 0.0–0.1)
Basophils Relative: 1.4 % (ref 0.0–3.0)
EOS ABS: 0.2 10*3/uL (ref 0.0–0.7)
Eosinophils Relative: 1.7 % (ref 0.0–5.0)
HCT: 39.9 % (ref 36.0–46.0)
Hemoglobin: 13.4 g/dL (ref 12.0–15.0)
LYMPHS ABS: 4 10*3/uL (ref 0.7–4.0)
Lymphocytes Relative: 31.8 % (ref 12.0–46.0)
MCHC: 33.6 g/dL (ref 30.0–36.0)
MCV: 90.9 fl (ref 78.0–100.0)
MONOS PCT: 5.2 % (ref 3.0–12.0)
Monocytes Absolute: 0.7 10*3/uL (ref 0.1–1.0)
Neutro Abs: 7.6 10*3/uL (ref 1.4–7.7)
Neutrophils Relative %: 59.9 % (ref 43.0–77.0)
PLATELETS: 361 10*3/uL (ref 150.0–400.0)
RBC: 4.39 Mil/uL (ref 3.87–5.11)
RDW: 14.6 % (ref 11.5–15.5)
WBC: 12.7 10*3/uL — ABNORMAL HIGH (ref 4.0–10.5)

## 2016-12-19 MED ORDER — PREDNISONE 10 MG PO TABS: ORAL_TABLET | ORAL | 0 refills | Status: DC

## 2016-12-19 MED ORDER — FLUTICASONE FUROATE-VILANTEROL 200-25 MCG/INH IN AEPB: 1.0000 | INHALATION_SPRAY | Freq: Every day | RESPIRATORY_TRACT | 0 refills | Status: DC

## 2016-12-19 MED ORDER — MONTELUKAST SODIUM 10 MG PO TABS: 10.0000 mg | ORAL_TABLET | Freq: Every day | ORAL | 3 refills | Status: DC

## 2016-12-19 MED ORDER — FLUTICASONE PROPIONATE 50 MCG/ACT NA SUSP: 2.0000 | Freq: Every day | NASAL | 2 refills | Status: DC

## 2016-12-19 NOTE — Progress Notes (Signed)
Subjective:    Patient ID: Amanda Dixon, female    DOB: [birth year], 51 y.o.   MRN: [id number]  HPI   This is the case of Amanda Dixon, 51 y.o. Female, who made this referral for cough.  As you very well know, patient is a non smoker, not known to have copd or asthma.  She had bronchitis in 2014 and was seen by ENT in [facility] and [address] [facility]. She was seen by Dr. Kendrick Fries in 2014. Workup then was unremarkable. PFT and x-ray were normal.  She usually gets nasal congestion/sinusitis/cough December-January since 2014.  The last winter however, her symptoms persisted. It started off with her usual cough, nasal congestion, sinusitis. She got a round of antibiotics with prednisone. She improved some but her symptoms recurred and were actually worse. She got another round of prednisone and antibiotics. Since finishing the medicines, her symptoms have persisted, worsening especially the last month.  She has GERD. Not on meds. According to the husband, she also coughs at night.  She denies any sick contacts. She was at the beach this weekend and she was coughing as well. She coughs both inside her house, outside her house, as well as her workplace. They've been living in a house for more than 10 years now. It to clean house. She has 3 dogs which she has had for many many years. No molds. No carpets. With regards her office, is an older building but they recently remodeled it and allegedly be changed the AC/vent system.  She has hypersomnia, snoring,gasping, witnessed apneas, unrefreshed sleep. Husband is concerned about sleep apnea.     Review of Systems  Constitutional: Negative.  Negative for fever and unexpected weight change.  HENT: Positive for congestion, sneezing and sore throat. Negative for dental problem, ear pain, nosebleeds, postnasal drip, rhinorrhea, sinus pressure and trouble swallowing.   Eyes: Negative.  Negative for redness and itching.  Respiratory: Positive for  cough and shortness of breath. Negative for chest tightness and wheezing.   Cardiovascular: Negative.  Negative for palpitations and leg swelling.  Gastrointestinal: Negative.  Negative for nausea and vomiting.  Endocrine: Negative.   Genitourinary: Negative.  Negative for dysuria.  Musculoskeletal: Positive for joint swelling.  Skin: Negative.  Negative for rash.  Allergic/Immunologic: Negative.  Negative for environmental allergies, food allergies and immunocompromised state.  Neurological: Positive for headaches.  Hematological: Negative.  Does not bruise/bleed easily.  Psychiatric/Behavioral: Negative.  Negative for dysphoric mood. The patient is not nervous/anxious.    Past Medical History:  Diagnosis Date  . Sinus trouble     (-) CA, DVT   Family History  Problem Relation Age of Onset  . Heart disease Maternal Grandfather   . Emphysema Maternal Grandfather   . Asthma Maternal Grandfather   . Heart disease Paternal Grandfather   . Heart disease Father        multpile bypass  . Emphysema Paternal Grandmother   . Allergies Sister   . Allergies Son   . Allergies Mother   . Allergies Other        mothers side  . Asthma Other        father side     Past Surgical History:  Procedure Laterality Date  . ABDOMINAL HYSTERECTOMY  2008   supracervical  . CESAREAN SECTION    . CHOLECYSTECTOMY  1996/97?  . TONSILLECTOMY      Social History   Social History  . Marital status: Married    Spouse name:  N/A  . Number of children: N/A  . Years of education: N/A   Occupational History  . executive assistant    Social History Main Topics  . Smoking status: Never Smoker  . Smokeless tobacco: Never Used     Comment: NEVER SMOKER:  EXPOSED TO 2ND(HUSBAND) X SEVERAL YEARS.   Marland Kitchen Alcohol use No  . Drug use: No  . Sexual activity: Yes    Partners: Male    Birth control/ protection: Surgical   Other Topics Concern  . Not on file   Social History Narrative  . No narrative  on file   Married with a son and stepdaughter. 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 Works from 8 am-5 pm.   No Known Allergies   Outpatient Medications Prior to Visit  Medication Sig Dispense Refill  . albuterol (PROVENTIL HFA;VENTOLIN HFA) 108 (90 Base) MCG/ACT inhaler Inhale 2 puffs into the lungs every 6 (six) hours as needed for wheezing. 1 Inhaler 2  . PARoxetine (PAXIL) 10 MG tablet Take 1 tablet (10 mg total) by mouth Dixon. 30 tablet 11  . triamcinolone (NASACORT AQ) 55 MCG/ACT nasal inhaler Place 2 sprays into the nose Dixon. (Patient not taking: Reported on 12/19/2016) 1 Inhaler 0   No facility-administered medications prior to visit.    Meds ordered this encounter  Medications  . DISCONTD: albuterol (PROVENTIL HFA;VENTOLIN HFA) 108 (90 Base) MCG/ACT inhaler    Sig: Inhale into the lungs.  . predniSONE (DELTASONE) 10 MG tablet    Sig: Take 3 tabs Dixon    Dispense:  90 tablet    Refill:  0  . montelukast (SINGULAIR) 10 MG tablet    Sig: Take 1 tablet (10 mg total) by mouth at bedtime.    Dispense:  30 tablet    Refill:  3  . fluticasone (FLONASE) 50 MCG/ACT nasal spray    Sig: Place 2 sprays into both nostrils at bedtime.    Dispense:  16 g    Refill:  2  . fluticasone furoate-vilanterol (BREO ELLIPTA) 200-25 MCG/INH AEPB    Sig: Inhale 1 puff into the lungs Dixon.    Dispense:  14 each    Refill:  0    Order Specific Question:   Lot Number?    Answer:   [id number]    Order Specific Question:   Expiration Date?    Answer:   04/28/2018    Order Specific Question:   Quantity    Answer:   1         Objective:   Physical Exam  Vitals:  Vitals:   12/19/16 1602  BP: 118/72  Pulse: 75  SpO2: 97%  Weight: 232 lb (105.2 kg)  Height: 5\' 5"  (1.651 m)    Constitutional/General:  Pleasant, well-nourished, well-developed, not in any distress,  Comfortably seating.  Well kempt  Body mass index is 38.61 kg/m. Wt Readings from Last 3 Encounters:   12/19/16 232 lb (105.2 kg)  10/07/16 231 lb (104.8 kg)  03/23/15 210 lb (95.3 kg)    HEENT: Pupils equal and reactive to light and accommodation. Anicteric sclerae. Normal nasal mucosa.   No oral  lesions,  mouth clear,  oropharynx clear, no postnasal drip. (-) Oral thrush. No dental caries.  Airway - Mallampati class III- IV  Neck: No masses. Midline trachea. No JVD, (-) LAD. (-) bruits appreciated.  Respiratory/Chest: Grossly normal chest. (-) deformity. (-) Accessory muscle use.  Symmetric expansion. (-) Tenderness on palpation.  Resonant on percussion.  Diminished BS on both lower lung zones. Occasional wheezing (-)  crackles, rhonchi (-) egophony  Cardiovascular: Regular rate and  rhythm, heart sounds normal, no murmur or gallops, no peripheral edema  Gastrointestinal:  Normal bowel sounds. Soft, non-tender. No hepatosplenomegaly.  (-) masses.   Musculoskeletal:  Normal muscle tone. Normal gait.   Extremities: Grossly normal. (-) clubbing, cyanosis.  (-) edema  Skin: (-) rash,lesions seen.   Neurological/Psychiatric : alert, oriented to time, place, person. Normal mood and affect          Assessment & Plan:  Cough Patient is a nonsmoker, usually gets a bout of bronchitis during winter of each year. She was seen by pulmonary in 2014. Has not had severe recurrence of bronchitis until this year. She has been congested, coughing, short of breath, since December 2017. She got better some with antibiotics and prednisone but her symptoms are persistent, worse, especially with change of season and the pollen. She also has untreated reflux disease which is making her cough worse.  Her cough is multifactorial. Most likely related to initially as a post viral cough, worsened by hyperreactive airway disease, upper airway cough syndrome, reflux disease.  Plan :  We extensively discussed the diagnosis of cough and possible triggers for cough.  Cough is most likely  multifactorial. Likely related to the following: A. Upper Airway Cough Syndrome  Plan to start Flonase 2 squirts per nostril at HS  Plan to start Singulair  10 mg/tab, 1 tab at HS  Plan to start Zyrtec or Allegra  10 mg/tab, 1 tab in AM. Mentioned about trying Benadryl as an alternative which will be a good medicine for allergy but she may not tolerate it as it makes her really sleepy.  I recommended trying 12.5 mg dose.   B. GERD  Plan to start Zantac 150 mg BID  Advised on dietary changes.  Try to keep HOB 30 degrees elevated.  C. Asthma-type reaction, allergies  Plan to start Breo  Prednisone taper 30 mg/d x 5 days, then 20 mg/d x 5 days, then 10 mg/d x 5 days  Plan for CXR, CBC, IgE, RAST      Hypersomnia Patient with hypersomnia, snoring, witnessed apneas, gasping, choking, unrefreshed sleep.  Plan for a home sleep study once she is better off from the cough.  Husband is concerned about sleep apnea.    Thank you very much for letting me participate in this patient's care. Please do not hesitate to give me a call if you have any questions or concerns regarding the treatment plan.   Patient will follow up with us in 3-4 weeks.     Pollie MeyerJ. Angelo A. de Dios, MD 12/19/2016   4:51 PM Pulmonary and Critical Care Medicine [facility] Pager: [phone] Office: [phone], Fax: [phone]

## 2016-12-19 NOTE — Assessment & Plan Note (Signed)
Patient with hypersomnia, snoring, witnessed apneas, gasping, choking, unrefreshed sleep.  Plan for a home sleep study once she is better off from the cough.  Husband is concerned about sleep apnea.

## 2016-12-19 NOTE — Assessment & Plan Note (Signed)
Patient is a nonsmoker, usually gets a bout of bronchitis during winter of each year. She was seen by pulmonary in 2014. Has not had severe recurrence of bronchitis until this year. She has been congested, coughing, short of breath, since December 2017. She got better some with antibiotics and prednisone but her symptoms are persistent, worse, especially with change of season and the pollen. She also has untreated reflux disease which is making her cough worse.  Her cough is multifactorial. Most likely related to initially as a post viral cough, worsened by hyperreactive airway disease, upper airway cough syndrome, reflux disease.  Plan :  We extensively discussed the diagnosis of cough and possible triggers for cough.  Cough is most likely multifactorial. Likely related to the following: A. Upper Airway Cough Syndrome  Plan to start Flonase 2 squirts per nostril at HS  Plan to start Singulair  10 mg/tab, 1 tab at HS  Plan to start Zyrtec or Allegra  10 mg/tab, 1 tab in AM. Mentioned about trying Benadryl as an alternative which will be a good medicine for allergy but she may not tolerate it as it makes her really sleepy.  I recommended trying 12.5 mg dose.   B. GERD  Plan to start Zantac 150 mg BID  Advised on dietary changes.  Try to keep HOB 30 degrees elevated.  C. Asthma-type reaction, allergies  Plan to start Breo  Prednisone taper 30 mg/d x 5 days, then 20 mg/d x 5 days, then 10 mg/d x 5 days  Plan for CXR, CBC, IgE, RAST

## 2016-12-19 NOTE — Patient Instructions (Signed)
It was a pleasure taking care of you today!  Your cough is most likely multifactorial. Likely related to the following: A. Upper Airway Cough Syndrome  Start Flonase 2 squirts per nostril at HS  [facility]air 10 [facility]tab[facility]  Start Zyrtec or Claritin 10 mg/tab, 1 tab in AM B. GERD  Start Zantac 150 mg/tab 1 tab 2x/day  Advised on dietary changes. Avoid food that will make your reflux worse.   Try to keep head of bed 30 degrees elevated.  Wait 2 hours at least after your last meal before you lie down C. Asthma-type reaction, allergies  Start Breo, 200 mcg/puff, 1 puff daily  Cont with albuterol 2 puffs every 4 hrs as needed.  Prednisone 10 mg/tab >> as we discussed.   We will try this plan and see if your cough gets better. Please call the office if your cough worsens while on treatment.  We will get an x-ray and blood work today.  Return to clinic in 3-4 weeks with NP

## 2016-12-20 LAB — RESPIRATORY ALLERGY PROFILE REGION II ~~LOC~~
Allergen, C. Herbarum, M2: <0.1 kU/L
Allergen, Comm Silver Birch, t9: <0.1 kU/L
Allergen, Cottonwood, t14: <0.1 kU/L
Allergen, D pternoyssinus,d7: <0.1 kU/L
Allergen, Mouse Urine Protein, e78: <0.1 kU/L
Allergen, Mulberry, t76: <0.1 kU/L
Allergen, P. notatum, m1: <0.1 kU/L
Aspergillus fumigatus, m3: <0.1 kU/L
Bermuda Grass: <0.1 kU/L
Box Elder IgE: <0.1 kU/L
Cockroach: <0.1 kU/L
Common Ragweed: <0.1 kU/L
D. farinae: <0.1 kU/L
Dog Dander: <0.1 kU/L
Elm IgE: <0.1 kU/L
IGE (IMMUNOGLOBULIN E), SERUM: 143 kU/L — AB (ref <115)
Johnson Grass: <0.1 kU/L

## 2016-12-24 ENCOUNTER — Institutional Professional Consult (permissible substitution): Payer: Self-pay | Admitting: Pulmonary Disease

## 2017-01-09 ENCOUNTER — Telehealth: Payer: Self-pay | Admitting: Pulmonary Disease

## 2017-01-09 NOTE — Telephone Encounter (Addendum)
Pt was returning our call because I sent a letter trying to reach her about her lab and xray results. Spoke with pt and informed her of both results. She is aware to keep her ov next week to discuss further. Labs were printed and placed in the mail per AD. Address in system was verified by pt and information has been placed in outgoing mail. She had no further questions at this time. Nothing further is needed

## 2017-01-13 ENCOUNTER — Encounter: Payer: Self-pay | Admitting: Acute Care

## 2017-01-13 ENCOUNTER — Ambulatory Visit (INDEPENDENT_AMBULATORY_CARE_PROVIDER_SITE_OTHER): Payer: 59 | Admitting: Acute Care

## 2017-01-13 VITALS — BP 132/70 | HR 76 | Ht 65.0 in | Wt 220.8 lb

## 2017-01-13 DIAGNOSIS — R05 Cough: Secondary | ICD-10-CM | POA: Diagnosis not present

## 2017-01-13 DIAGNOSIS — R059 Cough, unspecified: Secondary | ICD-10-CM

## 2017-01-13 DIAGNOSIS — J302 Other seasonal allergic rhinitis: Secondary | ICD-10-CM | POA: Diagnosis not present

## 2017-01-13 MED ORDER — FLUTICASONE FUROATE-VILANTEROL 200-25 MCG/INH IN AEPB: 1.0000 | INHALATION_SPRAY | Freq: Every day | RESPIRATORY_TRACT | 0 refills | Status: DC

## 2017-01-13 NOTE — Addendum Note (Signed)
Addended by: Velvet BatheAULFIELD, Mercer Peifer L on: 01/13/2017 09:50 AM   Modules accepted: Orders

## 2017-01-13 NOTE — Patient Instructions (Signed)
It is good to meet you today. Continue using Flonase and Singulair as you have been doing Continue Benadryl at night as you have been doing, or try Zyrtec Continue Zantac 150 every night. Follow the GERD diet. We will give you a copy of the diet. Referral for allergy with allergy testing. Breo 1 puff once daily every day. Rinse mouth after use. Follow up in 3 months to ensure you are still doing well. Please contact office for sooner follow up if symptoms do not improve or worsen or seek emergency care

## 2017-01-13 NOTE — Progress Notes (Signed)
History of Present Illness Amanda Dixon is a 51 y.o. female referred  for cough. She was seen by Dr. Christene Slates.   01/13/2017 Follow Up Cough: Pt was seen for cough 12/19/2016 by Dr. Christene Slates. The plan at that time was as below.  Cough is most likely multifactorial. Likely related to the following: A. Upper Airway Cough Syndrome             Plan to start Flonase 2 squirts per nostril at HS             Plan to start Singulair  10 mg/tab, 1 tab at HS             Plan to start Zyrtec or Allegra  10 mg/tab, 1 tab in AM. Mentioned about trying Benadryl as an alternative which will be a good medicine for allergy but she may not tolerate it as it makes her really sleepy.  I recommended trying 12.5 mg dose.   B. GERD             Plan to start Zantac 150 mg BID             Advised on dietary changes.             Try to keep HOB 30 degrees elevated.  C. Asthma-type reaction, allergies             Plan to start Breo             Prednisone taper 30 mg/d x 5 days, then 20 mg/d x 5 days, then 10 mg/d x 5 days  Plan for CXR, CBC, IgE, RAST  She presents today for follow up. She states she is much  better. She feels the Virgel Bouquet has really helped.She completed her Prednisone 6/14.She is compliant with her Flonase , Singulair and Benadryl.She has lost 12 pounds in the last 2 weeks. She has cough that has almost completely resolved.She states she has no significant sputum production.She denies chest pain, fever, calf pain, orthopnea or hemoptysis.  Previous PFT's 2014 were normal, indicated obesity and deconditioning.  Test Results:  CBC Latest Ref Rng & Units 12/19/2016 02/22/2013 05/24/2011  WBC 4.0 - 10.5 K/uL 12.7(H) 9.4 -  Hemoglobin 12.0 - 15.0 g/dL 40.9 81.1 91.4  Hematocrit 36.0 - 46.0 % 39.9 42.0 -  Platelets 150.0 - 400.0 K/uL 361.0 322.0 -    BMP Latest Ref Rng & Units 12/19/2016 05/08/2011  Glucose 70 - 99 mg/dL 782(N) 93  BUN 6 - 23 mg/dL 9 8  Creatinine 5.62 - 1.20 mg/dL 1.30 8.65    Sodium 784 - 145 mEq/L 138 139  Potassium 3.5 - 5.1 mEq/L 3.6 4.5  Chloride 96 - 112 mEq/L 102 101  CO2 19 - 32 mEq/L 31 -  Calcium 8.4 - 10.5 mg/dL 9.1 -    Dg Chest 2 View  Result Date: 12/20/2016 CLINICAL DATA:  51 year old female with shortness of breath for 2 weeks. EXAM: CHEST  2 VIEW COMPARISON:  02/22/2013 and earlier. FINDINGS: Lung volumes are stable and within normal limits. Mediastinal contours remain within normal limits. Visualized tracheal air column is within normal limits. No pneumothorax, pulmonary edema, pleural effusion or confluent pulmonary opacity. Lung markings appear stable since 2014 and within normal limits. Negative visible bowel gas pattern. No acute osseous abnormality identified. IMPRESSION: No acute cardiopulmonary abnormality. Electronically Signed   By: Odessa Fleming M.D.   On: 12/20/2016 07:43     Past medical hx  Past Medical History:  Diagnosis Date  . Sinus trouble      Social History  Substance Use Topics  . Smoking status: Never Smoker  . Smokeless tobacco: Never Used     Comment: NEVER SMOKER:  EXPOSED TO 2ND(HUSBAND) X SEVERAL YEARS.   Marland Kitchen. Alcohol use No    Tobacco Cessation: Never smoker  Past surgical hx, Family hx, Social hx all reviewed.  Current Outpatient Prescriptions on File Prior to Visit  Medication Sig  . albuterol (PROVENTIL HFA;VENTOLIN HFA) 108 (90 Base) MCG/ACT inhaler Inhale 2 puffs into the lungs every 6 (six) hours as needed for wheezing.  . fluticasone (FLONASE) 50 MCG/ACT nasal spray Place 2 sprays into both nostrils at bedtime.  . montelukast (SINGULAIR) 10 MG tablet Take 1 tablet (10 mg total) by mouth at bedtime.  Marland Kitchen. PARoxetine (PAXIL) 10 MG tablet Take 1 tablet (10 mg total) by mouth daily.  Marland Kitchen. triamcinolone (NASACORT AQ) 55 MCG/ACT nasal inhaler Place 2 sprays into the nose daily.   No current facility-administered medications on file prior to visit.      No Known Allergies  Review Of Systems:  Constitutional:    No  weight loss, night sweats,  Fevers, chills, fatigue, or  lassitude.  HEENT:   No headaches,  Difficulty swallowing,  Tooth/dental problems, or  Sore throat,                No sneezing, itching, ear ache, nasal congestion, post nasal drip,   CV:  No chest pain,  Orthopnea, PND, swelling in lower extremities, anasarca, dizziness, palpitations, syncope.   GI  No heartburn, indigestion, abdominal pain, nausea, vomiting, diarrhea, change in bowel habits, loss of appetite, bloody stools.   Resp: No shortness of breath with exertion or at rest.  No excess mucus, no productive cough,  No non-productive cough,  No coughing up of blood.  No change in color of mucus.  No wheezing.  No chest wall deformity  Skin: no rash or lesions.  GU: no dysuria, change in color of urine, no urgency or frequency.  No flank pain, no hematuria   MS:  No joint pain or swelling.  No decreased range of motion.  No back pain.  Psych:  No change in mood or affect. No depression or anxiety.  No memory loss.   Vital Signs BP 132/70 (BP Location: Right Arm, Cuff Size: Large)   Pulse 76   Ht 5\' 5"  (1.651 m)   Wt 220 lb 12.8 oz (100.2 kg)   SpO2 96%   BMI 36.74 kg/m    Physical Exam:  General- No distress,  A&Ox3, pleasant, talkative ENT: No sinus tenderness, TM clear, pale nasal mucosa, no oral exudate,no post nasal drip, no LAN Cardiac: S1, S2, regular rate and rhythm, no murmur Chest: No wheeze/ rales/ dullness; no accessory muscle use, no nasal flaring, no sternal retractions Abd.: Soft Non-tender, non-distended, BS + Ext: No clubbing cyanosis, edema Neuro:  normal strength Skin: No rashes, warm and dry Psych: normal mood and behavior   Assessment/Plan  Cough Resolved with prednisone taper and Breo Plan Continue using Flonase and Singulair as you have been doing Continue Benadryl at night as you have been doing, or try Zyrtec Continue Zantac 150 every night. Follow the GERD diet. We will give  you a copy of the diet. Referral for allergy with allergy testing. Breo 1 puff once daily every day. Rinse mouth after use. Follow up in 3 months to ensure you are still  doing well. Please contact office for sooner follow up if symptoms do not improve or worsen or seek emergency care       Bevelyn Ngo, NP 01/13/2017  9:47 AM

## 2017-01-13 NOTE — Progress Notes (Signed)
Reviewed, agree 

## 2017-01-13 NOTE — Assessment & Plan Note (Signed)
Resolved with prednisone taper and Breo Plan Continue using Flonase and Singulair as you have been doing Continue Benadryl at night as you have been doing, or try Zyrtec Continue Zantac 150 every night. Follow the GERD diet. We will give you a copy of the diet. Referral for allergy with allergy testing. Breo 1 puff once daily every day. Rinse mouth after use. Follow up in 3 months to ensure you are still doing well. Please contact office for sooner follow up if symptoms do not improve or worsen or seek emergency care

## 2017-02-18 ENCOUNTER — Encounter: Payer: Self-pay | Admitting: Allergy and Immunology

## 2017-02-18 ENCOUNTER — Ambulatory Visit (INDEPENDENT_AMBULATORY_CARE_PROVIDER_SITE_OTHER): Payer: 59 | Admitting: Allergy and Immunology

## 2017-02-18 VITALS — BP 102/62 | HR 68 | Temp 98.5°F | Resp 16 | Ht 65.1 in | Wt 218.8 lb

## 2017-02-18 DIAGNOSIS — Z7722 Contact with and (suspected) exposure to environmental tobacco smoke (acute) (chronic): Secondary | ICD-10-CM | POA: Diagnosis not present

## 2017-02-18 DIAGNOSIS — J3089 Other allergic rhinitis: Secondary | ICD-10-CM

## 2017-02-18 DIAGNOSIS — J4540 Moderate persistent asthma, uncomplicated: Secondary | ICD-10-CM

## 2017-02-18 DIAGNOSIS — K219 Gastro-esophageal reflux disease without esophagitis: Secondary | ICD-10-CM

## 2017-02-18 NOTE — Patient Instructions (Addendum)
  1. Allergen avoidance measures  2. Eliminate all indoor and car tobacco smoke exposure  3. Consolidate caffeine slowly - aim for none  4. Continue therapy from [facility]  5. Consider a course of immunotherapy

## 2017-02-18 NOTE — Progress Notes (Signed)
NEW PATIENT NOTE  Referring Provider: No ref. provider found Primary Provider: Patient, No Pcp Per Date of office visit: 02/18/2017    Subjective:   Chief Complaint:  Amanda Dixon (DOB: 1966/04/27) is a 51 y.o. female who presents to the clinic on 02/18/2017 with a chief complaint of Asthma and Sinus Problem .  HPI: Amanda Dixon presents to this clinic in evaluation of her multiorgan atopic disease manifested as asthma and allergic rhinitis in conjunction with LPR. She apparently developed significant problems with her respiratory tract this spring manifested as coughing and shortness of breath along with congestion of her head and sneezing and itchy red watery eyes for which she was evaluated by [facility] and treated with a combination of therapy directed against respiratory tract inflammation and reflux. She is much better as a result of that therapy and she is here today to define her aeroallergens hypersensitivity profile, consider allergen avoidance measures directed against specific allergens, and possibly consider a course of immunotherapy.  Past Medical History:  Diagnosis Date  . Asthma   . Sinus trouble     Past Surgical History:  Procedure Laterality Date  . ABDOMINAL HYSTERECTOMY  2008   supracervical  . CESAREAN SECTION    . CHOLECYSTECTOMY  1996/97?  . TONSILLECTOMY      Allergies as of 02/18/2017   No Known Allergies     Medication List      albuterol 108 (90 Base) MCG/ACT inhaler Commonly known as:  PROVENTIL HFA;VENTOLIN HFA Inhale 2 puffs into the lungs every 6 (six) hours as needed for wheezing.   fluticasone 50 MCG/ACT nasal spray Commonly known as:  FLONASE Place 2 sprays into both nostrils at bedtime.   fluticasone furoate-vilanterol 200-25 MCG/INH Aepb Commonly known as:  BREO ELLIPTA Inhale 1 puff into the lungs daily.   montelukast 10 MG tablet Commonly known as:  SINGULAIR Take 1 tablet (10 mg total) by mouth at bedtime.     PARoxetine 10 MG tablet Commonly known as:  PAXIL Take 1 tablet (10 mg total) by mouth daily.   triamcinolone 55 MCG/ACT nasal inhaler Commonly known as:  NASACORT AQ Place 2 sprays into the nose daily.       Review of systems negative except as noted in HPI / PMHx or noted below:  Review of Systems  Constitutional: Negative.   HENT: Negative.   Eyes: Negative.   Respiratory: Negative.   Cardiovascular: Negative.   Gastrointestinal: Negative.   Genitourinary: Negative.   Musculoskeletal: Negative.   Skin: Negative.   Neurological: Negative.   Endo/Heme/Allergies: Negative.   Psychiatric/Behavioral: Negative.     Family History  Problem Relation Age of Onset  . Heart disease Maternal Grandfather   . Emphysema Maternal Grandfather   . Asthma Maternal Grandfather   . Heart disease Paternal Grandfather   . Heart disease Father        multpile bypass  . Emphysema Paternal Grandmother   . Diabetes Paternal Grandmother   . Allergies Sister   . Diabetes Sister   . Asthma Sister   . Eczema Sister   . Allergies Son   . Allergies Mother   . Allergies Other        mothers side  . Asthma Other        father side  . Asthma Maternal Aunt   . Diabetes Paternal Aunt   . Diabetes Paternal Uncle     Social History   Social History  . Marital status:  Married    Spouse name: N/A  . Number of children: N/A  . Years of education: N/A   Occupational History  . executive assistant    Social History Main Topics  . Smoking status: Never Smoker  . Smokeless tobacco: Never Used     Comment: NEVER SMOKER:  EXPOSED TO 2ND(HUSBAND) X SEVERAL YEARS.   Marland Kitchen Alcohol use No  . Drug use: No  . Sexual activity: Yes    Partners: Male    Birth control/ protection: Surgical   Other Topics Concern  . Not on file   Social History Narrative  . No narrative on file    Environmental and Social history  Lives in a house with a dry environment, 2 dogs located inside the household,  no carpeting in the bedroom, no plastic on the bed, no plastic on the pillow, husband who smokes tobacco products indoors and in the car, and employment in an office setting.  Objective:   Vitals:   02/18/17 1353  BP: 102/62  Pulse: 68  Resp: 16  Temp: 98.5 F (36.9 C)   Height: 5' 5.1" (165.4 cm) Weight: 218 lb 12.8 oz (99.2 kg)  Physical Exam  Constitutional: She is well-developed, well-nourished, and in no distress.  Constant throat clearing and raspy voice and choking maneuvers and cough  HENT:  Head: Normocephalic. Head is without right periorbital erythema and without left periorbital erythema.  Right Ear: Tympanic membrane, external ear and ear canal normal.  Left Ear: Tympanic membrane, external ear and ear canal normal.  Nose: Nose normal. No mucosal edema or rhinorrhea.  Mouth/Throat: Uvula is midline, oropharynx is clear and moist and mucous membranes are normal. No oropharyngeal exudate.  Eyes: Pupils are equal, round, and reactive to light. Conjunctivae and lids are normal.  Neck: Trachea normal. No tracheal tenderness present. No tracheal deviation present. No thyromegaly present.  Cardiovascular: Normal rate, regular rhythm, S1 normal, S2 normal and normal heart sounds.   No murmur heard. Pulmonary/Chest: Effort normal and breath sounds normal. No stridor. No tachypnea. No respiratory distress. She has no wheezes. She has no rales. She exhibits no tenderness.  Abdominal: Soft. She exhibits no distension and no mass. There is no hepatosplenomegaly. There is no tenderness. There is no rebound and no guarding.  Musculoskeletal: She exhibits no edema or tenderness.  Lymphadenopathy:       Head (right side): No tonsillar adenopathy present.       Head (left side): No tonsillar adenopathy present.    She has no cervical adenopathy.    She has no axillary adenopathy.  Neurological: She is alert. Gait normal.  Skin: No rash noted. She is not diaphoretic. No erythema. No  pallor. Nails show no clubbing.  Psychiatric: Mood and affect normal.    Diagnostics: Allergy skin tests were performed. She did not demonstrate any hypersensitivity against a screening panel of aeroallergens or foods.  Spirometry was performed and demonstrated an FEV1 of 2.04 @ 71 % of predicted.  Assessment and Plan:    1. Asthma, moderate persistent, well-controlled   2. Other allergic rhinitis   3. LPRD (laryngopharyngeal reflux disease)   4. Secondhand smoke exposure     1. Allergen avoidance measures?  2. Eliminate all indoor and car tobacco smoke exposure  3. Consolidate caffeine slowly - aim for none  4. Continue therapy from [facility]  There did not appear to be much evidence for atopic disease driving the inflammation that appears to be present in June his  airway. This suggest that she has other insults to her airway and given the fact that she has a fair amount of symptoms consistent with LPR I suspect that one of the major issues giving rise to her respiratory tract irritation and inflammation his reflux disease. As well, there is the insult of tobacco smoke exposure. I had a talk with her today about eliminating environmental tobacco smoke exposure and I have had a talk with her today about addressing her reflux a little more aggressively by tapering off all of her caffeine. She will follow-up with continued therapy administered by pulmonary and I will be happy to see her in his clinic should there be a significant problem in the face of that treatment.  Laurette Schimke, MD Allergy / Immunology [facility]

## 2017-03-24 ENCOUNTER — Encounter: Payer: Self-pay | Admitting: Primary Care

## 2017-03-24 ENCOUNTER — Encounter (INDEPENDENT_AMBULATORY_CARE_PROVIDER_SITE_OTHER): Payer: Self-pay

## 2017-03-24 ENCOUNTER — Ambulatory Visit (INDEPENDENT_AMBULATORY_CARE_PROVIDER_SITE_OTHER): Payer: 59 | Admitting: Primary Care

## 2017-03-24 VITALS — BP 116/70 | HR 74 | Temp 98.1°F | Ht 65.0 in | Wt 216.4 lb

## 2017-03-24 DIAGNOSIS — R059 Cough, unspecified: Secondary | ICD-10-CM

## 2017-03-24 DIAGNOSIS — R05 Cough: Secondary | ICD-10-CM | POA: Diagnosis not present

## 2017-03-24 DIAGNOSIS — F419 Anxiety disorder, unspecified: Secondary | ICD-10-CM | POA: Diagnosis not present

## 2017-03-24 DIAGNOSIS — R053 Chronic cough: Secondary | ICD-10-CM

## 2017-03-24 MED ORDER — GUAIFENESIN-CODEINE 100-10 MG/5ML PO SOLN: 5.0000 mL | Freq: Every evening | ORAL | 0 refills | Status: DC | PRN

## 2017-03-24 NOTE — Patient Instructions (Signed)
You may take the cough suppressant at bedtime as needed for cough and rest. Caution this medication contains codeine and will make you feel drowsy.  Continue Breo inhaler, Singulair, benadryl, and Flonase.  Follow up with pulmonology as scheduled.  It was a pleasure to meet you today! Please don't hesitate to call me with any questions. Welcome to [facility]!

## 2017-03-24 NOTE — Assessment & Plan Note (Signed)
Using paxil mostly for hot flashes, feels well controlled. Continue current regimen.

## 2017-03-24 NOTE — Assessment & Plan Note (Signed)
Intermittent since early 2018. Compliant to Breo, singulair, Zantac, benadryl daily. Using albuterol appropriately.  Continue current regimen. Suspect recent symptoms are viral, exam stable today. Will provide short term guaifenesin with codeine syrup.  She will follow up with pulmonology as scheduled.

## 2017-03-24 NOTE — Progress Notes (Signed)
Subjective:    Patient ID: Amanda Dixon, female    DOB: Aug 06, 1965, 51 y.o.   MRN: [id number]  HPI  [name] is a 51 year old female who presents today to establish care and discuss the problems mentioned below. Will obtain old records.  1) Generalized Anxiety Disorder/Hot Flashes: Diagnosed in 2015 after the passing of her father. Currently managed on Paxil 10 mg. Overall feeling improved with anxiety, mostly uses for hot flashes. She feels well managed on her current regimen. Denies SI/HI.   2) Mild-Intermittent Asthma: Currently managed on Breo Ellipta for which she uses every night. Currently using albuterol inhaler PRN. She is using Flonase and Singulair for seasonal allergies. She recently underwent allergy testing in late July 2018 and tested negative for allergens. She uses her albuterol inhaler very intermittently prior to exercise and during hot days.   3) Cough: History of recurrent URI's in 2018, treated with several rounds of prednisone and antibiotics. Evaluated by pulmonology in May and June 2018 and provided with treatment of prednisone and Breo with improvement in symptoms. She was encouraged to continue Zantac BID, Singulair, benadryl/Zyrtec, Breo, and Flonase.  Recent symptoms began one week ago which include chest congestion, persistent cough, post nasal drip. Overall she's feeling better. She's been using Mucinex DM. She would like some cough medication to help her sleep at night.  Review of Systems  Constitutional: Negative for fever.  HENT: Positive for postnasal drip. Negative for sore throat.   Respiratory: Positive for cough. Negative for wheezing.   Genitourinary:       Hot flashes well controlled  Psychiatric/Behavioral: The patient is not nervous/anxious.        See HPI       Past Medical History:  Diagnosis Date  . Asthma   . Sinus trouble      Social History   Social History  . Marital status: Married    Spouse name: N/A  . Number of  children: N/A  . Years of education: N/A   Occupational History  . executive assistant    Social History Main Topics  . Smoking status: Never Smoker  . Smokeless tobacco: Never Used     Comment: NEVER SMOKER:  EXPOSED TO 2ND(HUSBAND) X SEVERAL YEARS.   Marland Kitchen Alcohol use No  . Drug use: No  . Sexual activity: Yes    Partners: Male    Birth control/ protection: Surgical   Other Topics Concern  . Not on file   Social History Narrative  . No narrative on file    Past Surgical History:  Procedure Laterality Date  . ABDOMINAL HYSTERECTOMY  2008   supracervical  . CESAREAN SECTION    . CHOLECYSTECTOMY  1996/97?  . TONSILLECTOMY      Family History  Problem Relation Age of Onset  . Heart disease Maternal Grandfather   . Emphysema Maternal Grandfather   . Asthma Maternal Grandfather   . Heart disease Paternal Grandfather   . Heart disease Father        multpile bypass  . Emphysema Paternal Grandmother   . Diabetes Paternal Grandmother   . Allergies Sister   . Diabetes Sister   . Asthma Sister   . Eczema Sister   . Allergies Son   . Allergies Mother   . Allergies Other        mothers side  . Asthma Other        father side  . Asthma Maternal Aunt   . Diabetes  Paternal Aunt   . Diabetes Paternal Uncle     No Known Allergies  Current Outpatient Prescriptions on File Prior to Visit  Medication Sig Dispense Refill  . albuterol (PROVENTIL HFA;VENTOLIN HFA) 108 (90 Base) MCG/ACT inhaler Inhale 2 puffs into the lungs every 6 (six) hours as needed for wheezing. 1 Inhaler 2  . fluticasone (FLONASE) 50 MCG/ACT nasal spray Place 2 sprays into both nostrils at bedtime. 16 g 2  . fluticasone furoate-vilanterol (BREO ELLIPTA) 200-25 MCG/INH AEPB Inhale 1 puff into the lungs daily. 1 each 0  . montelukast (SINGULAIR) 10 MG tablet Take 1 tablet (10 mg total) by mouth at bedtime. 30 tablet 3  . PARoxetine (PAXIL) 10 MG tablet Take 1 tablet (10 mg total) by mouth daily. 30 tablet 11    . triamcinolone (NASACORT AQ) 55 MCG/ACT nasal inhaler Place 2 sprays into the nose daily. 1 Inhaler 0   No current facility-administered medications on file prior to visit.     BP 116/70   Pulse 74   Temp 98.1 F (36.7 C) (Oral)   Ht 5\' 5"  (1.651 m)   Wt 216 lb 6.4 oz (98.2 kg)   SpO2 96%   BMI 36.01 kg/m    Objective:   Physical Exam  Constitutional: She appears well-nourished. She does not appear ill.  HENT:  Right Ear: Tympanic membrane and ear canal normal.  Left Ear: Tympanic membrane and ear canal normal.  Nose: Right sinus exhibits no maxillary sinus tenderness and no frontal sinus tenderness. Left sinus exhibits no maxillary sinus tenderness and no frontal sinus tenderness.  Mouth/Throat: Oropharynx is clear and moist.  Eyes: Conjunctivae are normal.  Neck: Neck supple.  Cardiovascular: Normal rate and regular rhythm.   Pulmonary/Chest: Effort normal and breath sounds normal. She has no wheezes. She has no rales.  Dry cough during exam  Lymphadenopathy:    She has no cervical adenopathy.  Skin: Skin is warm and dry.          Assessment & Plan:

## 2017-04-14 ENCOUNTER — Encounter: Payer: Self-pay | Admitting: Acute Care

## 2017-04-14 ENCOUNTER — Ambulatory Visit (INDEPENDENT_AMBULATORY_CARE_PROVIDER_SITE_OTHER): Payer: 59 | Admitting: Acute Care

## 2017-04-14 DIAGNOSIS — R05 Cough: Secondary | ICD-10-CM

## 2017-04-14 DIAGNOSIS — R053 Chronic cough: Secondary | ICD-10-CM

## 2017-04-14 MED ORDER — FLUTICASONE PROPIONATE 50 MCG/ACT NA SUSP: 2.0000 | Freq: Every day | NASAL | 6 refills | Status: DC

## 2017-04-14 MED ORDER — FLUTICASONE FUROATE-VILANTEROL 200-25 MCG/INH IN AEPB: 1.0000 | INHALATION_SPRAY | Freq: Every day | RESPIRATORY_TRACT | 6 refills | Status: DC

## 2017-04-14 MED ORDER — MONTELUKAST SODIUM 10 MG PO TABS: 10.0000 mg | ORAL_TABLET | Freq: Every day | ORAL | 3 refills | Status: DC

## 2017-04-14 MED ORDER — PREDNISONE 10 MG PO TABS: ORAL_TABLET | ORAL | 0 refills | Status: DC

## 2017-04-14 NOTE — Assessment & Plan Note (Signed)
Flare Plan: Continue Flonase 2 squirts per nostril at HS Continue  Singulair  10 mg/tab, 1 tab at HS Zyrtec or Allegra  10 mg/tab, 1 tab in AM.  Continue  Zantac 150 mg BID Try to keep HOB 30 degrees elevated. Continue  Breo 1 puff daily Rinse mouth after use. Prednisone taper 30 mg/d x 5 days, then 20 mg/d x 5 days, then 10 mg/d x 5 days No throat clearing, sips of water instead. No peppermint, spearmint, chocolate,oil bases vitamins. Throat soothing with sugar free hard candies. Follow GERD Diet. Follow in 6 months with Amanda Dixon. Please contact office for sooner follow up if symptoms do not improve or worsen or seek emergency care

## 2017-04-14 NOTE — Patient Instructions (Addendum)
It is good to see you today. We will refill prescriptions. Continue Flonase 2 squirts per nostril at HS Continue  Singulair  10 mg/tab, 1 tab at HS Zyrtec or Allegra  10 mg/tab, 1 tab in AM.  Continue  Zantac 150 mg BID Try to keep HOB 30 degrees elevated. Continue  Breo 1 puff daily Prednisone taper 30 mg/d x 5 days, then 20 mg/d x 5 days, then 10 mg/d x 5 days No throat clearing, sips of water instead. No peppermint, spearmint, chocolate,oil bases vitamins. Throat soothing with sugar free hard candies. Follow GERD Diet. Follow in 6 months with Dr. Sherene Sires. Please contact office for sooner follow up if symptoms do not improve or worsen or seek emergency care

## 2017-04-14 NOTE — Progress Notes (Signed)
Chart and office note reviewed in detail  > agree with a/p as outlined    

## 2017-04-14 NOTE — Progress Notes (Signed)
History of Present Illness Amanda Dixon is a 51 y.o. female with asthma and allergic rhinitis. She was originally sen by Dr. Christene Dixon.   04/14/2017 3 month follow up: Pt. Presents for follow up. She has been managing her cough and asthma with a regimen of Breo inhaler, Singulair, benadryl, and Flonase.She states she had been doing better , but her cough has not gone away. She did see an allergist, but there were no allergies discovered. She states she continues to have a cough. She has nasal congestion and post nasal drip.She states this was made worse by a virus she had the end of August. She states she has been compliant with her medications. She refuses to use nasal irrigation or [employer]. She has been seen by ENT in the past, and had chosen not to have video assessment of her sinuses due to bone spurs. She is doing a lot of throat clearing in the office today.She denies any secretions, fever, chest pain, orthopnea or hemoptysis.  Test Results:  CBC Latest Ref Rng & Units 12/19/2016 02/22/2013 05/24/2011  WBC 4.0 - 10.5 K/uL 12.7(H) 9.4 -  Hemoglobin 12.0 - 15.0 g/dL 16.1 09.6 04.5  Hematocrit 36.0 - 46.0 % 39.9 42.0 -  Platelets 150.0 - 400.0 K/uL 361.0 322.0 -    BMP Latest Ref Rng & Units 12/19/2016 05/08/2011  Glucose 70 - 99 mg/dL 409(W) 93  BUN 6 - 23 mg/dL 9 8  Creatinine 1.19 - 1.20 mg/dL 1.47 8.29  Sodium 562 - 145 mEq/L 138 139  Potassium 3.5 - 5.1 mEq/L 3.6 4.5  Chloride 96 - 112 mEq/L 102 101  CO2 19 - 32 mEq/L 31 -  Calcium 8.4 - 10.5 mg/dL 9.1 -     Past medical hx Past Medical History:  Diagnosis Date  . Asthma   . Sinus trouble      Social History  Substance Use Topics  . Smoking status: Never Smoker  . Smokeless tobacco: Never Used     Comment: NEVER SMOKER:  EXPOSED TO 2ND(HUSBAND) X SEVERAL YEARS.   Marland Kitchen Alcohol use No    Amanda Dixon reports that she has never smoked. She has never used smokeless tobacco. She reports that she does not drink alcohol or  use drugs.  Tobacco Cessation: Never smoker, but husband is a smoker and smokes in the house.  Past surgical hx, Family hx, Social hx all reviewed.  Current Outpatient Prescriptions on File Prior to Visit  Medication Sig  . albuterol (PROVENTIL HFA;VENTOLIN HFA) 108 (90 Base) MCG/ACT inhaler Inhale 2 puffs into the lungs every 6 (six) hours as needed for wheezing.  Marland Kitchen guaiFENesin-codeine 100-10 MG/5ML syrup Take 5 mLs by mouth at bedtime as needed for cough.  Marland Kitchen PARoxetine (PAXIL) 10 MG tablet Take 1 tablet (10 mg total) by mouth daily.  Marland Kitchen triamcinolone (NASACORT AQ) 55 MCG/ACT nasal inhaler Place 2 sprays into the nose daily.   No current facility-administered medications on file prior to visit.      No Known Allergies  Review Of Systems:  Constitutional:   No  weight loss, night sweats,  Fevers, chills, fatigue, or  lassitude.  HEENT:   No headaches,  Difficulty swallowing,  Tooth/dental problems, or  Sore throat,                No sneezing, itching, ear ache, +nasal congestion, +post nasal drip,   CV:  No chest pain,  Orthopnea, PND, swelling in lower extremities, anasarca, dizziness, palpitations, syncope.  GI  No heartburn, indigestion, abdominal pain, nausea, vomiting, diarrhea, change in bowel habits, loss of appetite, bloody stools.   Resp: No shortness of breath with exertion or at rest.  + excess mucus, no productive cough,  + non-productive cough,  No coughing up of blood.  No change in color of mucus.  + wheezing.  No chest wall deformity  Skin: no rash or lesions.  GU: no dysuria, change in color of urine, no urgency or frequency.  No flank pain, no hematuria   MS:  No joint pain or swelling.  No decreased range of motion.  No back pain.  Psych:  No change in mood or affect. No depression or anxiety.  No memory loss.   Vital Signs BP 102/60 (BP Location: Left Arm, Cuff Size: Normal)   Pulse 82   Ht  (1.651 m)   Wt 220 lb 6.4 oz (100 kg)   SpO2 96%   BMI  36.68 kg/m    Physical Exam:  General- No distress,  A&Ox3, pleasant  ENT: No sinus tenderness, TM clear, pale nasal mucosa, no oral exudate,+ post nasal drip, no LAN Cardiac: S1, S2, regular rate and rhythm, no murmur Chest: No wheeze/ rales/ dullness; no accessory muscle use, no nasal flaring, no sternal retractions Abd.: Soft Non-tender,obese Ext: No clubbing cyanosis, edema Neuro:  normal strength Skin: No rashes, warm and dry Psych: normal mood and behavior   Assessment/Plan  Chronic cough Flare Plan: Continue Flonase 2 squirts per nostril at HS Continue  Singulair  10 mg/tab, 1 tab at HS Zyrtec or Allegra  10 mg/tab, 1 tab in AM.  Continue  Zantac 150 mg BID Try to keep HOB 30 degrees elevated. Continue  Breo 1 puff daily Rinse mouth after use. Prednisone taper 30 mg/d x 5 days, then 20 mg/d x 5 days, then 10 mg/d x 5 days No throat clearing, sips of water instead. No peppermint, spearmint, chocolate,oil bases vitamins. Throat soothing with sugar free hard candies. Follow GERD Diet. Follow in 6 months with Dr. Sherene Dixon. Please contact office for sooner follow up if symptoms do not improve or worsen or seek emergency care    If no better at follow up consider CT sinuses/ re-consult to ENT  Amanda Ngo, NP 04/14/2017  9:50 AM

## 2017-05-13 ENCOUNTER — Other Ambulatory Visit: Payer: Self-pay | Admitting: Acute Care

## 2017-10-13 ENCOUNTER — Ambulatory Visit: Payer: Self-pay | Admitting: Internal Medicine

## 2017-10-27 ENCOUNTER — Ambulatory Visit: Payer: Self-pay | Admitting: Internal Medicine

## 2017-11-04 ENCOUNTER — Ambulatory Visit: Payer: Self-pay | Admitting: Internal Medicine

## 2017-11-10 ENCOUNTER — Other Ambulatory Visit: Payer: Self-pay | Admitting: Obstetrics and Gynecology

## 2017-11-10 DIAGNOSIS — F419 Anxiety disorder, unspecified: Secondary | ICD-10-CM

## 2017-11-10 NOTE — Telephone Encounter (Signed)
Please advise for refill. She is past due for annual would you like me to give her enough to get to her annual? Thank you!

## 2017-11-11 NOTE — Telephone Encounter (Signed)
Pt scheduled for annual 11/21/17

## 2017-11-11 NOTE — Telephone Encounter (Signed)
Can RF once annual sched. Thx

## 2017-11-21 ENCOUNTER — Ambulatory Visit
Admission: RE | Admit: 2017-11-21 | Discharge: 2017-11-21 | Disposition: A | Discharge status: Home / Self Care | Payer: 59 | Source: Ambulatory Visit | Attending: Obstetrics and Gynecology | Admitting: Obstetrics and Gynecology

## 2017-11-21 ENCOUNTER — Ambulatory Visit (INDEPENDENT_AMBULATORY_CARE_PROVIDER_SITE_OTHER): Payer: 59 | Admitting: Obstetrics and Gynecology

## 2017-11-21 ENCOUNTER — Other Ambulatory Visit: Payer: Self-pay | Admitting: Obstetrics and Gynecology

## 2017-11-21 ENCOUNTER — Encounter: Payer: Self-pay | Admitting: Obstetrics and Gynecology

## 2017-11-21 VITALS — BP 122/84 | HR 85 | Ht 65.0 in | Wt 235.0 lb

## 2017-11-21 DIAGNOSIS — Z713 Dietary counseling and surveillance: Secondary | ICD-10-CM

## 2017-11-21 DIAGNOSIS — B356 Tinea cruris: Secondary | ICD-10-CM

## 2017-11-21 DIAGNOSIS — F419 Anxiety disorder, unspecified: Secondary | ICD-10-CM | POA: Diagnosis not present

## 2017-11-21 DIAGNOSIS — Z01419 Encounter for gynecological examination (general) (routine) without abnormal findings: Secondary | ICD-10-CM

## 2017-11-21 DIAGNOSIS — Z1239 Encounter for other screening for malignant neoplasm of breast: Secondary | ICD-10-CM

## 2017-11-21 DIAGNOSIS — Z1231 Encounter for screening mammogram for malignant neoplasm of breast: Secondary | ICD-10-CM

## 2017-11-21 HISTORY — DX: Tinea cruris: B35.6

## 2017-11-21 MED ORDER — PAROXETINE HCL 10 MG PO TABS: 10.0000 mg | ORAL_TABLET | Freq: Every day | ORAL | 3 refills | Status: DC

## 2017-11-21 NOTE — Patient Instructions (Addendum)
I value your feedback and entrusting us with your care. If you get a [facility] patient survey, I would appreciate you taking the time to let us know about your experience today. Thank you!  [facility]: [phone]  [facility]: [phone]  

## 2017-11-21 NOTE — Progress Notes (Signed)
Chief Complaint  Patient presents with  . Gynecologic Exam     HPI:      [name] is a 52 y.o. G1P1001 who LMP was No LMP recorded. Patient has had a hysterectomy., presents today for her annual examination.  Her menses are absent due to supracx hyst. She does not have intermenstrual bleeding.  She does have vasomotor sx. She takes paxil for anxiety which may be helping sx. Sx frequency isn't bad but intensity is significant. Has stomach cramping with hot flashes and has to have BM afterwards.   She is single partner, contraception - status post hysterectomy. She does not have vaginal dryness.  Last Pap: July 27, 2015  Results were: no abnormalities /neg HPV DNA.  Hx of STDs: none  Last mammogram: July 27, 2015  Results were: normal--routine follow-up in 12 months There is no FH of breast cancer. There is no FH of ovarian cancer. The patient does do self-breast exams.  Colonoscopy: colonoscopy 3 years ago without abnormalities.   Tobacco use: none Alcohol use: none Exercise: not active  She does get adequate calcium and Vitamin D in her diet.  She had neg screening labs 2017. Has PCP now.    She has anxiety/mood changes which are controlled with paxil 10 mg. No side effects. Wants to cont Rx. Has been under significant stress with a demanding boss at work, working late at night and overtime. Only getting about 4 hrs of sleep a night and unable to exercise due to time constraints. Has noted wt gain this yr, particularly in abd area. Up 4# since last yr but pt states her wt yo-yos. Feels exhausted physically.   She has also had itching in bilat ing areas recently. Sx really bad for a few days after exercise/sweating. Scratches at night with skin weeping. Sx decrease if no sweating. Has tried antifungal spray with burning and OTC hydrocortisone crm.    Past Medical History:  Diagnosis Date  . Asthma   . Sinus trouble     Past Surgical History:    Procedure Laterality Date  . ABDOMINAL HYSTERECTOMY  2008   supracervical  . CESAREAN SECTION    . CHOLECYSTECTOMY  1996/97?  . TONSILLECTOMY      Family History  Problem Relation Age of Onset  . Heart disease Maternal Grandfather   . Emphysema Maternal Grandfather   . Asthma Maternal Grandfather   . Heart disease Paternal Grandfather   . Heart disease Father        multpile bypass  . Emphysema Paternal Grandmother   . Diabetes Paternal Grandmother   . Allergies Sister   . Diabetes Sister   . Asthma Sister   . Eczema Sister   . Allergies Son   . Allergies Mother   . Allergies Other        mothers side  . Asthma Other        father side  . Asthma Maternal Aunt   . Diabetes Paternal Aunt   . Diabetes Paternal Uncle     Social History   Socioeconomic History  . Marital status: Married    Spouse name: Not on file  . Number of children: Not on file  . Years of education: Not on file  . Highest education level: Not on file  Occupational History  . Occupation: Research scientist (medical)  Social Needs  . Financial resource strain: Not on file  . Food insecurity:    Worry: Not on file  Inability: Not on file  . Transportation needs:    Medical: Not on file    Non-medical: Not on file  Tobacco Use  . Smoking status: Never Smoker  . Smokeless tobacco: Never Used  . Tobacco comment: NEVER SMOKER:  EXPOSED TO 2ND(HUSBAND) X SEVERAL YEARS.   Substance and Sexual Activity  . Alcohol use: No  . Drug use: No  . Sexual activity: Yes    Partners: Male    Birth control/protection: Surgical    Comment: Hysterectomy   Lifestyle  . Physical activity:    Days per week: Not on file    Minutes per session: Not on file  . Stress: Not on file  Relationships  . Social connections:    Talks on phone: Not on file    Gets together: Not on file    Attends religious service: Not on file    Active member of club or organization: Not on file    Attends meetings of clubs or  organizations: Not on file    Relationship status: Not on file  . Intimate partner violence:    Fear of current or ex partner: Not on file    Emotionally abused: Not on file    Physically abused: Not on file    Forced sexual activity: Not on file  Other Topics Concern  . Not on file  Social History Narrative  . Not on file    Current Outpatient Medications on File Prior to Visit  Medication Sig Dispense Refill  . albuterol (PROVENTIL HFA;VENTOLIN HFA) 108 (90 Base) MCG/ACT inhaler Inhale 2 puffs into the lungs every 6 (six) hours as needed for wheezing. 1 Inhaler 2  . fluticasone (FLONASE) 50 MCG/ACT nasal spray Place 2 sprays into both nostrils at bedtime. 16 g 6  . fluticasone furoate-vilanterol (BREO ELLIPTA) 200-25 MCG/INH AEPB Inhale 1 puff into the lungs daily. 1 each 6  . triamcinolone (NASACORT AQ) 55 MCG/ACT nasal inhaler Place 2 sprays into the nose daily. 1 Inhaler 0  . guaiFENesin-codeine 100-10 MG/5ML syrup Take 5 mLs by mouth at bedtime as needed for cough. (Patient not taking: Reported on 11/21/2017) 50 mL 0  . montelukast (SINGULAIR) 10 MG tablet Take 1 tablet (10 mg total) by mouth at bedtime. (Patient not taking: Reported on 11/21/2017) 30 tablet 3  . predniSONE (DELTASONE) 10 MG tablet Take 3 tabs for 5 days, then 2 tabs for 5 days, 1 tab for 5  days, then stop. (Patient not taking: Reported on 11/21/2017) 30 tablet 0   No current facility-administered medications on file prior to visit.    ROS:  Review of Systems  Constitutional: Positive for fatigue. Negative for fever and unexpected weight change.  HENT: Positive for congestion.   Respiratory: Positive for shortness of breath and wheezing. Negative for cough.   Cardiovascular: Negative for chest pain, palpitations and leg swelling.  Gastrointestinal: Negative for blood in stool, constipation, diarrhea, nausea and vomiting.  Endocrine: Negative for cold intolerance, heat intolerance and polyuria.  Genitourinary:  Negative for dyspareunia, dysuria, flank pain, frequency, genital sores, hematuria, menstrual problem, pelvic pain, urgency, vaginal bleeding, vaginal discharge and vaginal pain.  Musculoskeletal: Positive for arthralgias. Negative for back pain, joint swelling and myalgias.  Skin: Positive for rash.  Neurological: Negative for dizziness, syncope, light-headedness, numbness and headaches.  Hematological: Negative for adenopathy.  Psychiatric/Behavioral: Negative for agitation, confusion, sleep disturbance and suicidal ideas. The patient is not nervous/anxious.      Objective: BP 122/84   Pulse 85  Ht  (1.651 m)   Wt 235 lb (106.6 kg)   BMI 39.11 kg/m    Physical Exam  Constitutional: She is oriented to person, place, and time. She appears well-developed and well-nourished.  Genitourinary: Vagina normal. There is no rash or tenderness on the right labia. There is no rash or tenderness on the left labia. No erythema or tenderness in the vagina. No vaginal discharge found. Right adnexum does not display mass and does not display tenderness. Left adnexum does not display mass and does not display tenderness.  Genitourinary Comments: UTERUS SURG REM  Neck: Normal range of motion. No thyromegaly present.  Cardiovascular: Normal rate, regular rhythm and normal heart sounds.  No murmur heard. Pulmonary/Chest: Effort normal and breath sounds normal. Right breast exhibits no mass, no nipple discharge, no skin change and no tenderness. Left breast exhibits no mass, no nipple discharge, no skin change and no tenderness.  Abdominal: Soft. There is no tenderness. There is no guarding.  Musculoskeletal: Normal range of motion.  Neurological: She is alert and oriented to person, place, and time. No cranial nerve deficit.  Skin: Rash noted. Rash is macular.     BILAT ING AREAS WITH ERYTHEMA, SCALE, AND HYPERPIGMENTATION; LT >RT  Psychiatric: She has a normal mood and affect. Her behavior is  normal.  Vitals reviewed.   Assessment/Plan: Encounter for annual routine gynecological examination  Screening for breast cancer - Pt to sched mammo. - Plan: MM DIGITAL SCREENING BILATERAL  Tinea cruris - OTC Clotrimazole crm BID for 2-4 wks. Keep area dry. Cold compresses prn itch. F/u prn.  Anxiety - Rx RF paxil. F/u prn.  - Plan: PARoxetine (PAXIL) 10 MG tablet  Weight loss counseling, encounter for - Discussed exercise on wknds, diet changes though [employer], work-life boundaries. F/u prn.   Meds ordered this encounter  Medications  . PARoxetine (PAXIL) 10 MG tablet    Sig: Take 1 tablet (10 mg total) by mouth daily.    Dispense:  90 tablet    Refill:  3    Order Specific Question:   Supervising Provider    Answer:   Nadara Mustard [[id number]]             GYN counsel breast self exam, mammography screening, adequate intake of calcium and vitamin D, diet and exercise     F/U  Return in about 1 year (around 11/22/2018).  Alicia B. Copland, PA-C 11/21/2017 9:42 AM

## 2017-11-27 ENCOUNTER — Encounter: Payer: Self-pay | Admitting: Obstetrics and Gynecology

## 2017-11-27 ENCOUNTER — Other Ambulatory Visit: Payer: Self-pay | Admitting: *Deleted

## 2017-11-27 ENCOUNTER — Inpatient Hospital Stay
Admission: RE | Admit: 2017-11-27 | Discharge: 2017-11-27 | Disposition: A | Discharge status: Home / Self Care | Payer: Self-pay | Source: Ambulatory Visit | Attending: *Deleted | Admitting: *Deleted

## 2017-11-27 DIAGNOSIS — Z9289 Personal history of other medical treatment: Secondary | ICD-10-CM

## 2017-12-02 ENCOUNTER — Ambulatory Visit: Payer: Self-pay | Admitting: Internal Medicine

## 2017-12-30 ENCOUNTER — Ambulatory Visit: Payer: Self-pay | Admitting: Internal Medicine

## 2018-02-18 ENCOUNTER — Other Ambulatory Visit: Payer: Self-pay

## 2018-02-18 ENCOUNTER — Encounter (HOSPITAL_COMMUNITY): Payer: Self-pay | Admitting: Emergency Medicine

## 2018-02-18 ENCOUNTER — Ambulatory Visit (HOSPITAL_COMMUNITY)
Admission: EM | Admit: 2018-02-18 | Discharge: 2018-02-18 | Disposition: A | Discharge status: Home / Self Care | Payer: 59 | Attending: Family Medicine | Admitting: Family Medicine

## 2018-02-18 DIAGNOSIS — R05 Cough: Secondary | ICD-10-CM | POA: Diagnosis not present

## 2018-02-18 DIAGNOSIS — R059 Cough, unspecified: Secondary | ICD-10-CM

## 2018-02-18 MED ORDER — PREDNISONE 10 MG (21) PO TBPK
ORAL_TABLET | Freq: Every day | ORAL | 0 refills | Status: DC
Start: 2018-02-18 — End: 2018-03-04

## 2018-02-18 MED ORDER — HYDROCODONE-HOMATROPINE 5-1.5 MG/5ML PO SYRP: 5.0000 mL | ORAL_SOLUTION | Freq: Four times a day (QID) | ORAL | 0 refills | Status: DC | PRN

## 2018-02-18 NOTE — ED Triage Notes (Signed)
Cough for 2 weeks.  Patient reports having had other symptoms initially, but this cough wont let go.

## 2018-02-18 NOTE — Discharge Instructions (Signed)
Be aware, your cough medication may cause drowsiness. Please do not drive, operate heavy machinery or make important decisions while on this medication, it can cloud your judgement.  

## 2018-03-04 ENCOUNTER — Ambulatory Visit (INDEPENDENT_AMBULATORY_CARE_PROVIDER_SITE_OTHER): Payer: 59 | Admitting: Internal Medicine

## 2018-03-04 ENCOUNTER — Encounter: Payer: Self-pay | Admitting: Internal Medicine

## 2018-03-04 VITALS — BP 118/68 | HR 76 | Ht 65.0 in | Wt 236.4 lb

## 2018-03-04 DIAGNOSIS — R05 Cough: Secondary | ICD-10-CM

## 2018-03-04 DIAGNOSIS — R053 Chronic cough: Secondary | ICD-10-CM

## 2018-03-04 DIAGNOSIS — R059 Cough, unspecified: Secondary | ICD-10-CM

## 2018-03-04 MED ORDER — ACETAMINOPHEN-CODEINE #3 300-30 MG PO TABS: 1.0000 | ORAL_TABLET | ORAL | 0 refills | Status: AC | PRN

## 2018-03-04 MED ORDER — PANTOPRAZOLE SODIUM 40 MG PO TBEC: 40.0000 mg | DELAYED_RELEASE_TABLET | Freq: Every day | ORAL | 2 refills | Status: DC

## 2018-03-04 MED ORDER — FAMOTIDINE 20 MG PO TABS: ORAL_TABLET | ORAL | 11 refills | Status: AC

## 2018-03-04 NOTE — ED Provider Notes (Signed)
[facility]   [id number] 02/18/18 Arrival Time: 1639  ASSESSMENT & PLAN:  1. Cough     Meds ordered this encounter  Medications  . predniSONE (STERAPRED UNI-PAK 21 TAB) 10 MG (21) TBPK tablet    Sig: Take by mouth daily. Take as directed.    Dispense:  21 tablet    Refill:  0  . HYDROcodone-homatropine (HYCODAN) 5-1.5 MG/5ML syrup    Sig: Take 5 mLs by mouth every 6 (six) hours as needed for cough.    Dispense:  60 mL    Refill:  0   Cough medication sedation precautions. Discussed typical duration of symptoms. OTC symptom care as needed. Ensure adequate fluid intake and rest. May f/u with PCP or here as needed.  Reviewed expectations re: course of current medical issues. Questions answered. Outlined signs and symptoms indicating need for more acute intervention. Patient verbalized understanding. After Visit Summary given.   SUBJECTIVE: History from: patient.  Amanda Dixon is a 52 y.o. female who presents with complaint of nasal congestion, post-nasal drainage, and a persistent dry cough. Onset abrupt, approximately 2 weeks ago. Now feeling somewhat better but cough is persistent. SOB: none. Wheezing: none. Fever: no. Overall normal PO intake without n/v. Sick contacts: no. OTC treatment: cough med without relief.   Social History   Tobacco Use  Smoking Status Never Smoker  Smokeless Tobacco Never Used  Tobacco Comment   NEVER SMOKER:  EXPOSED TO 2ND(HUSBAND) X SEVERAL YEARS.     ROS: As per HPI.   OBJECTIVE:  Vitals:   02/18/18 1653  BP: 108/83  Pulse: 80  Resp: 20  Temp: 98.1 F (36.7 C)  TempSrc: Oral  SpO2: 100%     General appearance: alert; appears fatigued HEENT: nasal congestion; clear runny nose; throat irritation secondary to post-nasal drainage Neck: supple without LAD Lungs: unlabored respirations, symmetrical air entry; cough: moderate; no respiratory distress Skin: warm and dry Psychological: alert and cooperative;  normal mood and affect  No Known Allergies  Past Medical History:  Diagnosis Date  . Asthma   . Sinus trouble    Family History  Problem Relation Age of Onset  . Heart disease Maternal Grandfather   . Emphysema Maternal Grandfather   . Asthma Maternal Grandfather   . Heart disease Paternal Grandfather   . Heart disease Father        multpile bypass  . Emphysema Paternal Grandmother   . Diabetes Paternal Grandmother   . Allergies Sister   . Diabetes Sister   . Asthma Sister   . Eczema Sister   . Allergies Son   . Allergies Mother   . Allergies Other        mothers side  . Asthma Other        father side  . Asthma Maternal Aunt   . Diabetes Paternal Aunt   . Diabetes Paternal Uncle   . Breast cancer Neg Hx    Social History   Socioeconomic History  . Marital status: Married    Spouse name: Not on file  . Number of children: Not on file  . Years of education: Not on file  . Highest education level: Not on file  Occupational History  . Occupation: Research scientist (medical)  Social Needs  . Financial resource strain: Not on file  . Food insecurity:    Worry: Not on file    Inability: Not on file  . Transportation needs:    Medical: Not on file  Non-medical: Not on file  Tobacco Use  . Smoking status: Never Smoker  . Smokeless tobacco: Never Used  . Tobacco comment: NEVER SMOKER:  EXPOSED TO 2ND(HUSBAND) X SEVERAL YEARS.   Substance and Sexual Activity  . Alcohol use: No  . Drug use: No  . Sexual activity: Yes    Partners: Male    Birth control/protection: Surgical    Comment: Hysterectomy   Lifestyle  . Physical activity:    Days per week: Not on file    Minutes per session: Not on file  . Stress: Not on file  Relationships  . Social connections:    Talks on phone: Not on file    Gets together: Not on file    Attends religious service: Not on file    Active member of club or organization: Not on file    Attends meetings of clubs or organizations: Not  on file    Relationship status: Not on file  . Intimate partner violence:    Fear of current or ex partner: Not on file    Emotionally abused: Not on file    Physically abused: Not on file    Forced sexual activity: Not on file  Other Topics Concern  . Not on file  Social History Narrative  . Not on file           Mardella LaymanHagler, Xareni Kelch, MD 03/04/18 [phone]

## 2018-03-04 NOTE — Progress Notes (Signed)
Amanda Dixon, female    DOB: 1965-08-23       MRN: [id number]   52 yowf never smoker healthy child nl IUP age 52 with post baby weight around 150 but gradually gained wt peak around 244 with proportionately worse  doe around 2012  ? Maybe some better  p inhaler esp in heat but never any trouble in  winter and lost down to 187 and got so much better able to do 5 k s inhaler in all conditions last time did this was  end of 2016 and then started gaining wt and  Then developed a cough that proved difficult to control starting in dec 2016 - march 2017 recurrent q Dec with severe hacking/ honking cough / nasal congestion /ear ache resp to abx/ pred and in between spells still  notes urge to cough with exp heat / laughter /husband's smoker and strong scentes  and does fine in cool weather but finds when work in [employer] in summer months still lots of coughing.   No better on Breo or singulair or gerd rx but not clear she took any med long enough to be sure it was or was not effective    03/04/2018  1st pulmonary eval   Chief Complaint  Patient presents with  . Acute Visit    Increased cough for the past month.  She saw PCP a wk ago- given pred and cough has improved some. Her cough is non prod.  She is using her albuterol inhaler 2 x daily on average.   Dyspnea:  End of June 2019  nose was stopped up and assoc severe dry  coughing/ better with prednisone  Cough: harsh cough laughing / better sleeping esp p hycodan but freq wakes up coughing in am  Sleep: on side / 2 pillows  SABA use: today and usually every 3-4h seemed to help before ex but really not clear   No obvious day to day or daytime variability or assoc excess/ purulent sputum or mucus plugs or hemoptysis or cp or chest tightness, subjective wheeze or overt sinus or hb symptoms.    . Also denies any obvious fluctuation of symptoms with weather or environmental changes or other aggravating or alleviating factors except as outlined above    No unusual exposure hx or h/o childhood pna/ asthma or knowledge of premature birth.  Current Allergies, Complete Past Medical History, Past Surgical History, Family History, and Social History were reviewed in [employer] [facility] record.  ROS  The following are not active complaints unless bolded Hoarseness, sore throat, dysphagia, dental problems, itching, sneezing,  nasal congestion or discharge of excess mucus or purulent secretions, ear ache,   fever, chills, sweats, unintended wt loss or wt gain, classically pleuritic or exertional cp,  orthopnea pnd or arm/hand swelling  or leg swelling, presyncope, palpitations, abdominal pain, anorexia, nausea, vomiting, diarrhea  or change in bowel habits or change in bladder habits, change in stools or change in urine, dysuria, hematuria,  rash, arthralgias, visual complaints, headache, numbness, weakness or ataxia or problems with walking or coordination,  change in mood or  memory.            Past Medical History:  Diagnosis Date  . Asthma   . Sinus trouble     Outpatient Medications Prior to Visit  - not able to verify what she really takes   Medication Sig Dispense Refill  . albuterol (PROVENTIL HFA;VENTOLIN HFA) 108 (90 Base) MCG/ACT inhaler  Inhale 2 puffs into the lungs every 6 (six) hours as needed for wheezing. 1 Inhaler 2  .       .      .      .      .      .      .      .                Objective:     BP 118/68 (BP Location: Right Arm, Cuff Size: Large)   Pulse 76   Ht 5\' 5"  (1.651 m)   Wt 236 lb 6.4 oz (107.2 kg)   SpO2 97%   BMI 39.34 kg/m   SpO2: 97 % RA  Wt Readings from Last 3 Encounters:  03/04/18 236 lb 6.4 oz (107.2 kg)  11/21/17 235 lb (106.6 kg)  04/14/17 220 lb 6.4 oz (100 kg)      amb obese harsh honking upper airway pattern cough    HEENT: nl dentition, turbinates bilaterally, and oropharynx. Nl external ear canals without cough reflex   NECK :  without JVD/Nodes/TM/ nl  carotid upstrokes bilaterally   LUNGS: no acc muscle use,  Nl contour chest which is clear to A and P bilaterally without cough on insp or exp maneuvers   CV:  RRR  no s3 or murmur or increase in P2, and no edema   ABD:  soft and nontender with nl inspiratory excursion in the supine position. No bruits or organomegaly appreciated, bowel sounds nl  MS:  Nl gait/ ext warm without deformities, calf tenderness, cyanosis or clubbing No obvious joint restrictions   SKIN: warm and dry without lesions    NEURO:  alert, approp, nl sensorium with  no motor or cerebellar deficits apparent.             Assessment   Chronic cough Allergy profile 12/19/16  >  Eos 0.2 /  IgE  143 RAST neg  - allergy skin testing ([facility]) 02/18/2017  Neg  - cyclica cough rx 03/04/2018  - Sinus CT ordered  The most common causes of chronic cough in immunocompetent adults include the following: upper airway cough syndrome (UACS), previously referred to as postnasal drip syndrome (PNDS), which is caused by variety of rhinosinus conditions; (2) asthma; (3) GERD; (4) chronic bronchitis from cigarette smoking or other inhaled environmental irritants; (5) nonasthmatic eosinophilic bronchitis; and (6) bronchiectasis.   These conditions, singly or in combination, have accounted for up to 94% of the causes of chronic cough in prospective studies.   Other conditions have constituted no >6% of the causes in prospective studies These have included bronchogenic carcinoma, chronic interstitial pneumonia, sarcoidosis, left ventricular failure, ACEI-induced cough, and aspiration from a condition associated with pharyngeal dysfunction.    Chronic cough is often simultaneously caused by more than one condition. A single cause has been found from 38 to 82% of the time, multiple causes from 18 to 62%. Multiply caused cough has been the result of three diseases up to 42% of the time.       Strongly evidence of uacs = flares with  irritants or sinus "drainage" and neg resp to BREO.  Upper airway cough syndrome (previously labeled PNDS),  is so named because it's frequently impossible to sort out how much is  CR/sinusitis with freq throat clearing (which can be related to primary GERD)   vs  causing  secondary (" extra esophageal")  GERD from wide swings in gastric pressure  that occur with throat clearing, often  promoting self use of mint and menthol lozenges that reduce the lower esophageal sphincter tone and exacerbate the problem further in a cyclical fashion.   These are the same pts (now being labeled as having "irritable larynx syndrome" by some cough centers) who not infrequently have a history of having failed to tolerate ace inhibitors,  dry powder inhalers like BREO  or biphosphonates or report having atypical/extraesophageal reflux symptoms that don't respond to standard doses of PPI  and are easily confused as having aecopd or asthma flares by even experienced allergists/ pulmonologists (myself included).    Of the three most common causes of  Sub-acute / recurrent or chronic cough, only one (GERD)  can actually contribute to/ trigger  the other two (asthma and post nasal drip syndrome)  and perpetuate the cylce of cough.  While not intuitively obvious, many patients with chronic low grade reflux do not cough until there is a primary insult that disturbs the protective epithelial barrier and exposes sensitive nerve endings.   This is typically viral but can due to PNDS and  either may apply here.   The point is that once this occurs, it is difficult to eliminate the cycle  using anything but a maximally effective acid suppression regimen at least in the short run, accompanied by an appropriate diet to address non acid GERD and control / eliminate the cough itself for at least 3 days with Tyleno #3 and eliminate pnds with 1st gen H1 blockers per guidelines     Discussed: The standardized cough guidelines published in  Chest by [place] in 2006 are still the best available and consist of a multiple step process (up to 12!) , not a single office visit,  and are intended  to address this problem logically,  with an alogrithm dependent on response to empiric treatment at  each progressive step  to determine a specific diagnosis with  minimal addtional testing needed. Therefore if adherence is an issue or can't be accurately verified,  it's very unlikely the standard evaluation and treatment will be successful here.    Furthermore, response to therapy (other than acute cough suppression, which should only be used short term with avoidance of narcotic containing cough syrups if possible), can be a gradual process for which the patient is not likely to  perceive immediate benefit.  Unlike going to an eye doctor where the best perscription is almost always the first one and is immediately effective, this is almost never the case in the management of chronic cough syndromes. Therefore the patient needs to commit up front to consistently adhere to recommendations  for up to 6 weeks of therapy directed at the likely underlying problem(s) before the response can be reasonably evaluated.     Will regroup in 4 wks but must agree to return with all meds in hand using a trust but verify approach to confirm accurate Medication  Reconciliation The principal here is that until we are certain that the  patients are doing what we've asked, it makes no sense to ask them to do more.     I had an extended discussion with the patient reviewing all relevant studies completed to date and  lasting 25 minutes of a 40  minute acute office visit with pt new to me with severe non-specific but potentially very serious refractory respiratory symptoms of uncertain and potentially multiple  etiologies.   Each maintenance medication was reviewed in detail including most  importantly the difference between maintenance and prns and under what  circumstances the prns are to be triggered using an action plan format that is not reflected in the computer generated alphabetically organized AVS.    Please see AVS for specific instructions unique to this office visit that I personally wrote and verbalized to the the pt in detail and then reviewed with pt  by my nurse highlighting any changes in therapy/plan of care  recommended at today's visit.         Sandrea HughsMichael Romie Tay, MD 03/04/2018

## 2018-03-04 NOTE — Patient Instructions (Signed)
The key to effective treatment for your cough is eliminating the non-stop cycle of cough you're stuck in long enough to let your airway heal completely and then see if there is anything still making you cough once you stop the cough suppression, but this should take no more than 5 days to figure out  First take delsym two tsp every 12 hours and supplement if needed with tylenol #3  up to 1  every 4 hours to suppress the urge to cough at all or even clear your throat. Swallowing water or using ice chips/non mint and menthol containing candies (such as lifesavers or sugarless jolly ranchers) are also effective.  You should rest your voice and avoid activities that you know make you cough.  Once you have eliminated the cough for 3 straight days try reducing the tylenol #3 ,  then the delsym as tolerated.      Protonix (pantoprazole) Take 30-60 min before first meal of the day and Pepcid 20 mg one bedtime plus Chlorpheniramine 4 mg x 2 at bedtime (both available over the counter)  until cough is completely gone for at least a week without the need for cough suppression  GERD (REFLUX)  is an extremely common cause of respiratory symptoms, many times with no significant heartburn at all.    It can be treated with medication, but also with lifestyle changes including avoidance of late meals, excessive alcohol, smoking cessation, and avoid fatty foods, chocolate, peppermint, colas, red wine, and acidic juices such as orange juice.  NO MINT OR MENTHOL PRODUCTS SO NO COUGH DROPS   USE HARD CANDY INSTEAD (jolley ranchers or Stover's or Lifesavers (all available in sugarless versions) NO OIL BASED VITAMINS - use powdered substitutes.   Please see patient coordinator before you leave today  to schedule sinus CT    Please schedule a follow up office visit in 4 weeks, sooner if needed  with all medications /inhalers/ solutions in hand so we can verify exactly what you are taking. This includes all medications  from all doctors and over the counters

## 2018-03-04 NOTE — Assessment & Plan Note (Addendum)
Allergy profile 12/19/16  >  Eos 0.2 /  IgE  143 RAST neg  - allergy skin testing ([facility]) 02/18/2017  Neg  - cyclica cough rx 03/04/2018  - Sinus CT ordered  The most common causes of chronic cough in immunocompetent adults include the following: upper airway cough syndrome (UACS), previously referred to as postnasal drip syndrome (PNDS), which is caused by variety of rhinosinus conditions; (2) asthma; (3) GERD; (4) chronic bronchitis from cigarette smoking or other inhaled environmental irritants; (5) nonasthmatic eosinophilic bronchitis; and (6) bronchiectasis.   These conditions, singly or in combination, have accounted for up to 94% of the causes of chronic cough in prospective studies.   Other conditions have constituted no >6% of the causes in prospective studies These have included bronchogenic carcinoma, chronic interstitial pneumonia, sarcoidosis, left ventricular failure, ACEI-induced cough, and aspiration from a condition associated with pharyngeal dysfunction.    Chronic cough is often simultaneously caused by more than one condition. A single cause has been found from 38 to 82% of the time, multiple causes from 18 to 62%. Multiply caused cough has been the result of three diseases up to 42% of the time.       Strongly evidence of uacs = flares with irritants or sinus "drainage" and neg resp to BREO.  Upper airway cough syndrome (previously labeled PNDS),  is so named because it's frequently impossible to sort out how much is  CR/sinusitis with freq throat clearing (which can be related to primary GERD)   vs  causing  secondary (" extra esophageal")  GERD from wide swings in gastric pressure that occur with throat clearing, often  promoting self use of mint and menthol lozenges that reduce the lower esophageal sphincter tone and exacerbate the problem further in a cyclical fashion.   These are the same pts (now being labeled as having "irritable larynx syndrome" by some cough centers) who  not infrequently have a history of having failed to tolerate ace inhibitors,  dry powder inhalers like BREO  or biphosphonates or report having atypical/extraesophageal reflux symptoms that don't respond to standard doses of PPI  and are easily confused as having aecopd or asthma flares by even experienced allergists/ pulmonologists (myself included).    Of the three most common causes of  Sub-acute / recurrent or chronic cough, only one (GERD)  can actually contribute to/ trigger  the other two (asthma and post nasal drip syndrome)  and perpetuate the cylce of cough.  While not intuitively obvious, many patients with chronic low grade reflux do not cough until there is a primary insult that disturbs the protective epithelial barrier and exposes sensitive nerve endings.   This is typically viral but can due to PNDS and  either may apply here.   The point is that once this occurs, it is difficult to eliminate the cycle  using anything but a maximally effective acid suppression regimen at least in the short run, accompanied by an appropriate diet to address non acid GERD and control / eliminate the cough itself for at least 3 days with Tyleno #3 and eliminate pnds with 1st gen H1 blockers per guidelines     Discussed: The standardized cough guidelines published in Chest by [place] in 2006 are still the best available and consist of a multiple step process (up to 12!) , not a single office visit,  and are intended  to address this problem logically,  with an alogrithm dependent on response to empiric treatment at  each progressive step  to determine a specific diagnosis with  minimal addtional testing needed. Therefore if adherence is an issue or can't be accurately verified,  it's very unlikely the standard evaluation and treatment will be successful here.    Furthermore, response to therapy (other than acute cough suppression, which should only be used short term with avoidance of narcotic containing  cough syrups if possible), can be a gradual process for which the patient is not likely to  perceive immediate benefit.  Unlike going to an eye doctor where the best perscription is almost always the first one and is immediately effective, this is almost never the case in the management of chronic cough syndromes. Therefore the patient needs to commit up front to consistently adhere to recommendations  for up to 6 weeks of therapy directed at the likely underlying problem(s) before the response can be reasonably evaluated.     Will regroup in 4 wks but must agree to return with all meds in hand using a trust but verify approach to confirm accurate Medication  Reconciliation The principal here is that until we are certain that the  patients are doing what we've asked, it makes no sense to ask them to do more.     I had an extended discussion with the patient reviewing all relevant studies completed to date and  lasting 25 minutes of a 40  minute acute office visit with pt new to me with severe non-specific but potentially very serious refractory respiratory symptoms of uncertain and potentially multiple  etiologies.   Each maintenance medication was reviewed in detail including most importantly the difference between maintenance and prns and under what circumstances the prns are to be triggered using an action plan format that is not reflected in the computer generated alphabetically organized AVS.    Please see AVS for specific instructions unique to this office visit that I personally wrote and verbalized to the the pt in detail and then reviewed with pt  by my nurse highlighting any changes in therapy/plan of care  recommended at today's visit.

## 2018-03-12 ENCOUNTER — Ambulatory Visit (INDEPENDENT_AMBULATORY_CARE_PROVIDER_SITE_OTHER)
Admission: RE | Admit: 2018-03-12 | Discharge: 2018-03-12 | Disposition: A | Discharge status: Home / Self Care | Payer: 59 | Source: Ambulatory Visit | Attending: Internal Medicine | Admitting: Internal Medicine

## 2018-03-12 DIAGNOSIS — R053 Chronic cough: Secondary | ICD-10-CM

## 2018-03-12 DIAGNOSIS — R05 Cough: Secondary | ICD-10-CM

## 2018-03-12 DIAGNOSIS — R059 Cough, unspecified: Secondary | ICD-10-CM

## 2018-03-13 NOTE — Progress Notes (Signed)
Left detailed msg on machine ok per DPR

## 2018-04-01 ENCOUNTER — Encounter: Payer: Self-pay | Admitting: Internal Medicine

## 2018-04-01 ENCOUNTER — Ambulatory Visit (INDEPENDENT_AMBULATORY_CARE_PROVIDER_SITE_OTHER): Payer: 59 | Admitting: Internal Medicine

## 2018-04-01 VITALS — BP 124/78 | HR 78 | Ht 65.0 in | Wt 236.4 lb

## 2018-04-01 DIAGNOSIS — R0602 Shortness of breath: Secondary | ICD-10-CM

## 2018-04-01 DIAGNOSIS — R05 Cough: Secondary | ICD-10-CM | POA: Diagnosis not present

## 2018-04-01 DIAGNOSIS — R053 Chronic cough: Secondary | ICD-10-CM

## 2018-04-01 LAB — NITRIC OXIDE: Nitric Oxide: 14

## 2018-04-01 MED ORDER — GABAPENTIN 100 MG PO CAPS: 100.0000 mg | ORAL_CAPSULE | Freq: Three times a day (TID) | ORAL | 2 refills | Status: DC

## 2018-04-01 NOTE — Patient Instructions (Signed)
Gabapentin 100 mg build up to four times daily   Continue Pantoprazole (protonix) 40 mg   Take  30-60 min before first meal of the day and Pepcid (famotidine)  20 mg one @  bedtime until return to office - this is the best way to tell whether stomach acid is contributing to your problem.    For drainage / throat tickle try take CHLORPHENIRAMINE  4 mg - take one every 4 hours as needed - available over the counter- may cause drowsiness so start with just a bedtime dose or two and see how you tolerate it before trying in daytime     Only use your albuterol as a rescue medication to be used if you can't catch your breath by resting or doing a relaxed purse lip breathing pattern.  - The less you use it, the better it will work when you need it. - Ok to use up to 2 puffs  every 4 hours if you must but call for immediate appointment if use goes up over your usual need - Don't leave home without it !!  (think of it like the spare tire for your car)    I will be referring you to Dr Deland Pretty at [facility] / voice center

## 2018-04-01 NOTE — Progress Notes (Signed)
Amanda Dixon, female    DOB: February 20, 1966       MRN: [id number]    Brief patient profile:  52 yowf never smoker healthy child nl IUP age 52 with post baby weight around 150 but gradually gained wt peak around 244 with proportionately worse  doe around 2012  ? Maybe some better  p inhaler esp in heat but never any trouble in  winter and lost down to 187 and got so much better able to do 5 k s inhaler in all conditions last time did this was  end of 2016 and then started gaining wt and  Then developed a cough that proved difficult to control starting in dec 2016 - march 2017 recurrent q Dec with severe hacking/ honking cough / nasal congestion /ear ache resp to abx/ pred and in between spells still  notes urge to cough with exp heat / laughter /husband's smoke  and strong scents  and does fine in cool weather but finds when work in [employer] in summer months still lots of coughing.   No better on Breo or singulair or gerd rx but not clear she took any med long enough to be sure it was or was not effective    Brief patient profile:  .03/04/2018  1st pulmonary eval   Cough x 2016 with always have a throat tickle / has not  Chief Complaint  Patient presents with  . Acute Visit    Increased cough for the past month.  She saw PCP a wk ago- given pred and cough has improved some. Her cough is non prod.  She is using her albuterol inhaler 2 x daily on average.   Dyspnea:  End of June 2019  nose was stopped up and assoc severe dry  coughing/ better with prednisone  Cough: harsh cough laughing / better sleeping esp p hycodan but freq wakes up coughing in am  Sleep: on side / 2 pillows  SABA use: today and usually every 3-4h seemed to help before ex but really not clear rec The key to effective treatment   First take delsym two tsp every 12 hours and supplement if needed with tylenol #3  up to 1  every 4 hours to suppress the urge to cough at all or even clear your throat.  Once you have eliminated the  cough for 3 straight days try reducing the tylenol #3 ,  then the delsym as tolerated.    Protonix (pantoprazole) Take 30-60 min before first meal of the day and Pepcid 20 mg one bedtime plus Chlorpheniramine 4 mg x 2 at bedtime (both available over the counter)  until cough is completely gone for at least a week without the need for cough suppression GERD diet  schedule sinus CT > neg  Please schedule a follow up office visit in 4 weeks, sooner if needed  with all medications /inhalers/ solutions in hand so we can verify exactly what you are taking. This includes all medications from all doctors and over the counters     04/01/2018  f/u ov/Amanda Dixon re: throat tickle "always sucking lozenges" x 2016 / did not bring meds as req   Chief Complaint  Patient presents with  . Follow-up    Cough has improved some and is waking her up less. She is using her albuterol inhaler 3-4 x per wk on average.    Dyspnea:  Not able to work out due to choking sensations  Cough: more day than  noct no longer using hycodan and never took tyl #3 x at hs   Sleeping: r side / 2 pillows better p chlorpheniramine x 2 at hs  SABA use: cut down on alb use     No obvious day to day or daytime variability or assoc excess/ purulent sputum or mucus plugs or hemoptysis or cp or chest tightness, subjective wheeze or overt sinus or hb symptoms.   Sleeping as above without nocturnal  or early am exacerbation  of respiratory  c/o's or need for noct saba. Also denies any obvious fluctuation of symptoms with weather or environmental changes or other aggravating or alleviating factors except as outlined above   No unusual exposure hx or h/o childhood pna/ asthma or knowledge of premature birth.  Current Allergies, Complete Past Medical History, Past Surgical History, Family History, and Social History were reviewed in [employer] record.  ROS  The following are not active complaints unless bolded Hoarseness,  sore throat, dysphagia, dental problems, itching, sneezing,  nasal congestion or discharge of excess mucus or purulent secretions, ear ache,   fever, chills, sweats, unintended wt loss or wt gain, classically pleuritic or exertional cp,  orthopnea pnd or arm/hand swelling  or leg swelling, presyncope, palpitations, abdominal pain, anorexia, nausea, vomiting, diarrhea  or change in bowel habits or change in bladder habits, change in stools or change in urine, dysuria, hematuria,  rash, arthralgias, visual complaints, headache, numbness, weakness or ataxia or problems with walking or coordination,  change in mood or  memory.        Current Meds  Medication Sig  . albuterol (PROVENTIL HFA;VENTOLIN HFA) 108 (90 Base) MCG/ACT inhaler Inhale 2 puffs into the lungs every 6 (six) hours as needed for wheezing.  . chlorpheniramine (CHLOR-TRIMETON) 4 MG tablet Take 8 mg by mouth at bedtime.  . famotidine (PEPCID) 20 MG tablet One at bedtime  . pantoprazole (PROTONIX) 40 MG tablet Take 1 tablet (40 mg total) by mouth daily. Take 30-60 min before first meal of the day  . PARoxetine (PAXIL) 10 MG tablet Take 1 tablet (10 mg total) by mouth daily.  Marland Kitchen triamcinolone (NASACORT AQ) 55 MCG/ACT nasal inhaler Place 2 sprays into the nose daily.                Objective:     04/01/2018         236   03/04/18 236 lb 6.4 oz (107.2 kg)  11/21/17 235 lb (106.6 kg)  04/14/17 220 lb 6.4 oz (100 kg)    amb obese wf harsh shrill dry cough    Vital signs reviewed - Note on arrival 02 sats  100% on RA     HEENT: nl dentition, turbinates bilaterally, and oropharynx. Nl external ear canals without cough reflex   NECK :  without JVD/Nodes/TM/ nl carotid upstrokes bilaterally   LUNGS: no acc muscle use,  Nl contour chest which is clear to A and P bilaterally without cough on insp or exp maneuvers   CV:  RRR  no s3 or murmur or increase in P2, and no edema   ABD:  soft and nontender with nl inspiratory excursion  in the supine position. No bruits or organomegaly appreciated, bowel sounds nl  MS:  Nl gait/ ext warm without deformities, calf tenderness, cyanosis or clubbing No obvious joint restrictions   SKIN: warm and dry without lesions    NEURO:  alert, approp, nl sensorium with  no motor or cerebellar deficits apparent.  Assessment

## 2018-04-02 ENCOUNTER — Encounter: Payer: Self-pay | Admitting: Internal Medicine

## 2018-04-02 NOTE — Assessment & Plan Note (Signed)
Allergy profile 12/19/16  >  Eos 0.2 /  IgE  143 RAST neg  - allergy skin testing ([facility]) 02/18/2017  Neg  - cyclical cough rx 03/04/2018 : unable to do "tyl #3 knocked me out"  - Sinus CT 03/12/2018  No significant findings   - FENO 04/01/2018  =  14  - Spirometry 04/01/2018  FEV1 3.0 (105%)  Ratio 96 p nothing prior  - 04/01/2018  rec  Gabapentin 100 mg four times a day and refer to Dr Delford Field [facility]   She has had a continuous sense of a throat tickle and extreme sensitivity to non-specific irritants x 3 years and convinced something is stuck in her throat  s ent eval to date "they did that in the past " indicating to me her condition dates back more than her stated 3 y hx.  This is classic  Upper airway cough syndrome (previously labeled PNDS),  is so named because it's frequently impossible to sort out how much is  CR/sinusitis with freq throat clearing (which can be related to primary GERD)   vs  causing  secondary (" extra esophageal")  GERD from wide swings in gastric pressure that occur with throat clearing, often  promoting self use of mint and menthol lozenges that reduce the lower esophageal sphincter tone and exacerbate the problem further in a cyclical fashion.   These are the same pts (now being labeled as having "irritable larynx syndrome" by some cough centers) who not infrequently have a history of having failed to tolerate ace inhibitors,  dry powder inhalers or biphosphonates or report having atypical/extraesophageal reflux symptoms that don't respond to standard doses of PPI  and are easily confused as having aecopd or asthma flares by even experienced allergists/ pulmonologists (myself included).   rec trial of gabapentin building up to qid and 1st gen H1 blockers per guidelines  During the day if tolerated pending eval by Dr Delford Field [facility].   I had an extended discussion with the patient reviewing all relevant studies completed to date and  lasting 25 minutes of a 40  minute office visit  addressing in detail the ddx and management  Of her chronic severe non-specific but potentially very serious refractory respiratory symptoms of uncertain and potentially multiple  etiologies.   Each maintenance medication was reviewed in detail including most importantly the difference between maintenance and prns and under what circumstances the prns are to be triggered using an action plan format that is not reflected in the computer generated alphabetically organized AVS.    Please see AVS for specific instructions unique to this office visit that I personally wrote and verbalized to the the pt in detail and then reviewed with pt  by my nurse highlighting any changes in therapy/plan of care  recommended at today's visit.

## 2018-04-02 NOTE — Assessment & Plan Note (Signed)
03/2013 Full PFT > ratio normal, FEV1 2.39L (80% pred) no change with BD; (FEF 25-75 changed 26% with BD); TLC 3.8 L (73% pred), DLCO 27.72 (108% pred)  I reviewed Dr Corey Skains prev eval 03/30/13 and concur this problem started with wt gain as documented in hx and spent extra time explaining the difference between predictable doe related to obesity/ deconditioning and EIA asking her to monitor whether rechallenging with the same level of ex p use of saba results in marked improvement in ex tolerance doing the exact same intensity of ex 5 min p saba to which she responded"that's not the way I ever exercise"   Because she coughs so much during interview I doubt a cpst or MCT is practical in case and suggested again she try the saba as above to prove benefit or lack thereof.

## 2018-08-05 ENCOUNTER — Encounter: Payer: Self-pay | Admitting: Family Medicine

## 2018-08-05 ENCOUNTER — Ambulatory Visit (INDEPENDENT_AMBULATORY_CARE_PROVIDER_SITE_OTHER): Payer: 59 | Admitting: Family Medicine

## 2018-08-05 VITALS — BP 146/82 | HR 80 | Temp 98.7°F | Resp 12 | Ht 65.0 in | Wt 238.0 lb

## 2018-08-05 DIAGNOSIS — B9789 Other viral agents as the cause of diseases classified elsewhere: Secondary | ICD-10-CM

## 2018-08-05 DIAGNOSIS — J069 Acute upper respiratory infection, unspecified: Secondary | ICD-10-CM

## 2018-08-05 MED ORDER — PREDNISONE 10 MG PO TABS: 30.0000 mg | ORAL_TABLET | Freq: Every day | ORAL | 0 refills | Status: AC

## 2018-08-05 MED ORDER — BENZONATATE 100 MG PO CAPS: 100.0000 mg | ORAL_CAPSULE | Freq: Two times a day (BID) | ORAL | 0 refills | Status: DC | PRN

## 2018-08-05 NOTE — Progress Notes (Signed)
Subjective:     Amanda Dixon is a 53 y.o. female presenting for Cough (x 2 weeks. Fatigue, hoarse, cough is dry, nasal congestion, dyspnea, some wheezing. No fever. Has taking OTC Nyquil, allergy and cough formula.)     Cough  This is a new problem. The current episode started 1 to 4 weeks ago. The problem has been waxing and waning. The cough is non-productive. Associated symptoms include postnasal drip, a sore throat, shortness of breath and wheezing. Pertinent negatives include no ear congestion, ear pain, fever, headaches, heartburn or nasal congestion. She has tried OTC cough suppressant (antihistamine) for the symptoms. The treatment provided mild relief. chronic cough   Hx of chronic cough -- under control until this started  Review of Systems  Constitutional: Positive for fatigue. Negative for fever.  HENT: Positive for congestion, postnasal drip, sore throat and voice change (hoarse). Negative for ear pain.   Respiratory: Positive for cough, shortness of breath and wheezing.   Gastrointestinal: Negative for heartburn.  Neurological: Negative for headaches.    04/01/2018: Pulm - Chronic cough - gabapentin 100 mg 4 times daily, pantoprazole, pepcid, chlorpheniramine. Only use albuterol as rescue 04/2018: ENT - avoid gabapentin, continue PPI, neurogenic cough  Social History   Tobacco Use  Smoking Status Never Smoker  Smokeless Tobacco Never Used  Tobacco Comment   NEVER SMOKER:  EXPOSED TO 2ND(HUSBAND) X SEVERAL YEARS.         Objective:    BP Readings from Last 3 Encounters:  08/05/18 (!) 146/82  04/01/18 124/78  03/04/18 118/68   Wt Readings from Last 3 Encounters:  08/05/18 238 lb (108 kg)  04/01/18 236 lb 6.4 oz (107.2 kg)  03/04/18 236 lb 6.4 oz (107.2 kg)    BP (!) 146/82   Pulse 80   Temp 98.7 F (37.1 C)   Resp 12   Ht 5\' 5"  (1.651 m)   Wt 238 lb (108 kg)   SpO2 95%   BMI 39.61 kg/m    Physical Exam Constitutional:      General: She is  not in acute distress.    Appearance: She is well-developed. She is not diaphoretic.  HENT:     Head: Normocephalic and atraumatic.     Right Ear: Tympanic membrane and ear canal normal.     Left Ear: Tympanic membrane and ear canal normal.     Nose: Mucosal edema and rhinorrhea present.     Right Sinus: No maxillary sinus tenderness or frontal sinus tenderness.     Left Sinus: No maxillary sinus tenderness or frontal sinus tenderness.     Mouth/Throat:     Pharynx: Uvula midline. Posterior oropharyngeal erythema present. No oropharyngeal exudate.     Tonsils: Swelling: 0 on the right. 0 on the left.  Eyes:     General: No scleral icterus.    Conjunctiva/sclera: Conjunctivae normal.  Neck:     Musculoskeletal: Neck supple.  Cardiovascular:     Rate and Rhythm: Normal rate and regular rhythm.     Heart sounds: Normal heart sounds. No murmur.  Pulmonary:     Effort: Pulmonary effort is normal. No respiratory distress.     Breath sounds: Normal breath sounds.  Lymphadenopathy:     Cervical: No cervical adenopathy.  Skin:    General: Skin is warm and dry.     Capillary Refill: Capillary refill takes less than 2 seconds.  Neurological:     Mental Status: She is alert.  Psychiatric:  Mood and Affect: Mood normal.           Assessment & Plan:   Problem List Items Addressed This Visit    None    Visit Diagnoses    Viral URI with cough    -  Primary   Relevant Medications   benzonatate (TESSALON) 100 MG capsule   predniSONE (DELTASONE) 10 MG tablet     Given hx of chronic cough discussed prednisone which has helped in the past. Trial of Tessalon pearls for daytime coughing.   If symptoms return immediately after 5 day prednisone burst can do taper of 20 mg x 5 days 10 mg x 5 days    Return if symptoms worsen or fail to improve.  Lynnda Child, MD

## 2018-08-05 NOTE — Patient Instructions (Addendum)
Based on your symptoms, it looks like you have a virus.   Antibiotics are not need for a viral infection but the following will help:   1. Drink plenty of fluids 2. Get lots of rest  Sinus Congestion 1) Saline Spray-- 2 times day -- if tolerated 2) Flonase (Store Brand ok) - once daily 3) Over the counter congestion medications  Cough 1) Cough drops can be helpful 2) Nyquil (or nighttime cough medication) 3) Honey is proven to be one of the best cough medications  4) Try tessalon medications 5) Prednisone x 5 days -- if symptoms return we can do a longer taper  Sore Throat 1) Honey as above, cough drops 2) Ibuprofen or Aleve can be helpful 3) Salt water Gargles  If you develop fevers (Temperature >100.4), chills, worsening symptoms or symptoms lasting longer than 10 days return to clinic.

## 2018-12-22 ENCOUNTER — Telehealth: Payer: Self-pay | Admitting: Obstetrics and Gynecology

## 2018-12-22 ENCOUNTER — Other Ambulatory Visit: Payer: Self-pay | Admitting: Obstetrics and Gynecology

## 2018-12-22 DIAGNOSIS — F419 Anxiety disorder, unspecified: Secondary | ICD-10-CM

## 2018-12-22 NOTE — Telephone Encounter (Signed)
Patient needs refill on paroxetine, annual scheduled 7/1 with [facility].  [facility]

## 2018-12-22 NOTE — Telephone Encounter (Signed)
Please advise 

## 2018-12-22 NOTE — Telephone Encounter (Signed)
Rx RF eRxd.  

## 2019-01-25 ENCOUNTER — Other Ambulatory Visit: Payer: Self-pay

## 2019-01-25 ENCOUNTER — Ambulatory Visit (INDEPENDENT_AMBULATORY_CARE_PROVIDER_SITE_OTHER): Payer: 59 | Admitting: Primary Care

## 2019-01-25 ENCOUNTER — Encounter: Payer: Self-pay | Admitting: Primary Care

## 2019-01-25 VITALS — BP 140/82 | HR 75 | Temp 98.0°F | Ht 65.0 in | Wt 234.5 lb

## 2019-01-25 DIAGNOSIS — Z91030 Bee allergy status: Secondary | ICD-10-CM

## 2019-01-25 DIAGNOSIS — L03113 Cellulitis of right upper limb: Secondary | ICD-10-CM | POA: Diagnosis not present

## 2019-01-25 DIAGNOSIS — Z23 Encounter for immunization: Secondary | ICD-10-CM | POA: Diagnosis not present

## 2019-01-25 MED ORDER — EPINEPHRINE 0.3 MG/0.3ML IJ SOAJ: 0.3000 mg | INTRAMUSCULAR | 0 refills | Status: DC | PRN

## 2019-01-25 MED ORDER — METHYLPREDNISOLONE ACETATE 80 MG/ML IJ SUSP
80.0000 mg | Freq: Once | INTRAMUSCULAR | Status: AC
  Administered 2019-01-25: 80 mg via INTRAMUSCULAR

## 2019-01-25 MED ORDER — CEPHALEXIN 500 MG PO CAPS: 500.0000 mg | ORAL_CAPSULE | Freq: Three times a day (TID) | ORAL | 0 refills | Status: AC

## 2019-01-25 NOTE — Assessment & Plan Note (Signed)
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 Instructions provided for use.

## 2019-01-25 NOTE — Addendum Note (Signed)
Addended by: Jacqualin Combes on: 01/25/2019 03:23 PM   Modules accepted: Orders

## 2019-01-25 NOTE — Assessment & Plan Note (Signed)
Evident to right dorsal hand secondary to wasp sting. Rx for cephalexin course provided. IM Depo Medrol 80 mg provided. Tetanus updated.   She will update in 3-4 days if no improvement.

## 2019-01-25 NOTE — Patient Instructions (Signed)
Start Cephalexin antibiotics for the infection. Take 1 capsule by mouth three times daily for 7 days.  You were provided with a tetanus vaccination and steroid injection today.   Use the EpiPen if you develop shortness of breath, wheezing, throat tightness with a future bee sting.  Please update me in 3-4 days if no improvement.  It was a pleasure to see you today!

## 2019-01-25 NOTE — Progress Notes (Signed)
Subjective:    Patient ID: Amanda Dixon, female    DOB: 02-Nov-1965, 53 y.o.   MRN: [id number]  HPI  Ms. Bruneau is a 53 year old female with a history of seasonal allergies, chronic cough, asthma who presents today with a chief complaint of bee sting.  She was stung two days ago while walking outdoors on the sidewalk. She was stung by a wasp or yellow jacket. She's since experienced swelling and erythema with progressive swelling and skin is now "hot" to the touch and painful.    She denies fevers, shortness of breath wheezing, throat tightness. She once carried an EpiPen as a child due to bee sting allergy, has since "overgrown" allergy to stings. She's been taking Benadryl with little improvement in symptoms.  Review of Systems  Constitutional: Negative for fever.  Respiratory: Negative for shortness of breath and wheezing.   Skin: Positive for color change.       Erythema, swelling, pain to right dorsal hand       Past Medical History:  Diagnosis Date  . Asthma   . Sinus trouble      Social History   Socioeconomic History  . Marital status: Married    Spouse name: Not on file  . Number of children: Not on file  . Years of education: Not on file  . Highest education level: Not on file  Occupational History  . Occupation: Scientist, water quality  Social Needs  . Financial resource strain: Not on file  . Food insecurity    Worry: Not on file    Inability: Not on file  . Transportation needs    Medical: Not on file    Non-medical: Not on file  Tobacco Use  . Smoking status: Never Smoker  . Smokeless tobacco: Never Used  . Tobacco comment: NEVER SMOKER:  EXPOSED TO 2ND(HUSBAND) X SEVERAL YEARS.   Substance and Sexual Activity  . Alcohol use: No  . Drug use: No  . Sexual activity: Yes    Partners: Male    Birth control/protection: Surgical    Comment: Hysterectomy   Lifestyle  . Physical activity    Days per week: Not on file    Minutes per session: Not on  file  . Stress: Not on file  Relationships  . Social Herbalist on phone: Not on file    Gets together: Not on file    Attends religious service: Not on file    Active member of club or organization: Not on file    Attends meetings of clubs or organizations: Not on file    Relationship status: Not on file  . Intimate partner violence    Fear of current or ex partner: Not on file    Emotionally abused: Not on file    Physically abused: Not on file    Forced sexual activity: Not on file  Other Topics Concern  . Not on file  Social History Narrative  . Not on file    Past Surgical History:  Procedure Laterality Date  . ABDOMINAL HYSTERECTOMY  2008   supracervical  . CESAREAN SECTION    . CHOLECYSTECTOMY  1996/97?  . TONSILLECTOMY      Family History  Problem Relation Age of Onset  . Heart disease Maternal Grandfather   . Emphysema Maternal Grandfather   . Asthma Maternal Grandfather   . Heart disease Paternal Grandfather   . Heart disease Father        multpile  bypass  . Emphysema Paternal Grandmother   . Diabetes Paternal Grandmother   . Allergies Sister   . Diabetes Sister   . Asthma Sister   . Eczema Sister   . Allergies Son   . Allergies Mother   . Allergies Other        mothers side  . Asthma Other        father side  . Asthma Maternal Aunt   . Diabetes Paternal Aunt   . Diabetes Paternal Uncle   . Breast cancer Neg Hx     No Known Allergies  Current Outpatient Medications on File Prior to Visit  Medication Sig Dispense Refill  . albuterol (PROVENTIL HFA;VENTOLIN HFA) 108 (90 Base) MCG/ACT inhaler Inhale 2 puffs into the lungs every 6 (six) hours as needed for wheezing. 1 Inhaler 2  . benzonatate (TESSALON) 100 MG capsule Take 1 capsule (100 mg total) by mouth 2 (two) times daily as needed for cough. 20 capsule 0  . chlorpheniramine (CHLOR-TRIMETON) 4 MG tablet Take 8 mg by mouth at bedtime.    . famotidine (PEPCID) 20 MG tablet One at  bedtime 30 tablet 11  . pantoprazole (PROTONIX) 40 MG tablet Take 1 tablet (40 mg total) by mouth daily. Take 30-60 min before first meal of the day 30 tablet 2  . PARoxetine (PAXIL) 10 MG tablet TAKE 1 TABLET BY MOUTH EVERY DAY 30 tablet 1  . triamcinolone (NASACORT AQ) 55 MCG/ACT nasal inhaler Place 2 sprays into the nose daily. 1 Inhaler 0   No current facility-administered medications on file prior to visit.     BP 140/82   Pulse 75   Temp 98 F (36.7 C) (Tympanic)   Ht 5\' 5"  (1.651 m)   Wt 234 lb 8 oz (106.4 kg)   SpO2 98%   BMI 39.02 kg/m    Objective:   Physical Exam  Constitutional: She appears well-nourished.  Neck: Neck supple.  Cardiovascular: Normal rate and regular rhythm.  Respiratory: Effort normal and breath sounds normal. No respiratory distress. She has no wheezes.  Skin: Skin is warm and dry. There is erythema.  Moderate erythema and edema to right dorsal hand. Tender and warm upon palpation.           Assessment & Plan:

## 2019-01-26 NOTE — Progress Notes (Signed)
Chief Complaint  Patient presents with  . Gynecologic Exam     HPI:      [name] is a 53 y.o. G1P1001 who LMP was No LMP recorded. Patient has had a hysterectomy., presents today for her annual examination.  Her menses are absent due to supracx hyst. She does not have intermenstrual bleeding. She does have vasomotor sx improved with paxil. Decreased frequency this yr.   She is single partner, contraception - status post hysterectomy. She does not have vaginal dryness.  Last Pap: July 27, 2015  Results were: no abnormalities /neg HPV DNA.  Hx of STDs: none  Last mammogram: 11/21/17  Results were: normal--routine follow-up in 12 months There is no FH of breast cancer. There is no FH of ovarian cancer. The patient does do self-breast exams.  Colonoscopy: colonoscopy 4 years ago without abnormalities. Repeat due after ? 10 yrs--pt unsure. States it was normal without any polyps. [facility] but no notes in Epic.   Tobacco use: none Alcohol use: none Exercise: not active  She does get adequate calcium and Vitamin D in her diet.  She had neg screening labs 2017. Due for fasting labs.    She has anxiety/mood changes which are controlled with paxil 10 mg. Has stressful job. No side effects. Wants to cont Rx.   Past Medical History:  Diagnosis Date  . Asthma   . Sinus trouble     Past Surgical History:  Procedure Laterality Date  . ABDOMINAL HYSTERECTOMY  2008   supracervical  . CESAREAN SECTION    . CHOLECYSTECTOMY  1996/97?  . TONSILLECTOMY      Family History  Problem Relation Age of Onset  . Heart disease Maternal Grandfather   . Emphysema Maternal Grandfather   . Asthma Maternal Grandfather   . Heart disease Paternal Grandfather   . Heart disease Father        multpile bypass  . Emphysema Paternal Grandmother   . Diabetes Paternal Grandmother   . Allergies Sister   . Diabetes Sister   . Asthma Sister   . Eczema Sister   .  Allergies Son   . Allergies Mother   . Allergies Other        mothers side  . Asthma Other        father side  . Asthma Maternal Aunt   . Diabetes Paternal Aunt   . Diabetes Paternal Uncle   . Breast cancer Neg Hx     Social History   Socioeconomic History  . Marital status: Married    Spouse name: Not on file  . Number of children: Not on file  . Years of education: Not on file  . Highest education level: Not on file  Occupational History  . Occupation: Research scientist (medical)executive assistant  Social Needs  . Financial resource strain: Not on file  . Food insecurity    Worry: Not on file    Inability: Not on file  . Transportation needs    Medical: Not on file    Non-medical: Not on file  Tobacco Use  . Smoking status: Never Smoker  . Smokeless tobacco: Never Used  . Tobacco comment: NEVER SMOKER:  EXPOSED TO 2ND(HUSBAND) X SEVERAL YEARS.   Substance and Sexual Activity  . Alcohol use: No  . Drug use: No  . Sexual activity: Yes    Partners: Male    Birth control/protection: Surgical    Comment: Hysterectomy   Lifestyle  .  Physical activity    Days per week: Not on file    Minutes per session: Not on file  . Stress: Not on file  Relationships  . Social Musicianconnections    Talks on phone: Not on file    Gets together: Not on file    Attends religious service: Not on file    Active member of club or organization: Not on file    Attends meetings of clubs or organizations: Not on file    Relationship status: Not on file  . Intimate partner violence    Fear of current or ex partner: Not on file    Emotionally abused: Not on file    Physically abused: Not on file    Forced sexual activity: Not on file  Other Topics Concern  . Not on file  Social History Narrative  . Not on file    Current Outpatient Medications on File Prior to Visit  Medication Sig Dispense Refill  . albuterol (PROVENTIL HFA;VENTOLIN HFA) 108 (90 Base) MCG/ACT inhaler Inhale 2 puffs into the lungs every 6 (six)  hours as needed for wheezing. 1 Inhaler 2  . cephALEXin (KEFLEX) 500 MG capsule Take 1 capsule (500 mg total) by mouth 3 (three) times daily for 7 days. 21 capsule 0  . chlorpheniramine (CHLOR-TRIMETON) 4 MG tablet Take 8 mg by mouth at bedtime.    Marland Kitchen. EPINEPHrine 0.3 mg/0.3 mL IJ SOAJ injection Inject 0.3 mLs (0.3 mg total) into the muscle as needed for anaphylaxis. 1 each 0  . famotidine (PEPCID) 20 MG tablet One at bedtime 30 tablet 11  . pantoprazole (PROTONIX) 40 MG tablet Take 1 tablet (40 mg total) by mouth daily. Take 30-60 min before first meal of the day 30 tablet 2  . triamcinolone (NASACORT AQ) 55 MCG/ACT nasal inhaler Place 2 sprays into the nose daily. 1 Inhaler 0   No current facility-administered medications on file prior to visit.    ROS:  Review of Systems  Constitutional: Negative for fatigue, fever and unexpected weight change.  HENT: Negative for congestion.   Respiratory: Negative for cough, shortness of breath and wheezing.   Cardiovascular: Negative for chest pain, palpitations and leg swelling.  Gastrointestinal: Negative for blood in stool, constipation, diarrhea, nausea and vomiting.  Endocrine: Negative for cold intolerance, heat intolerance and polyuria.  Genitourinary: Negative for dyspareunia, dysuria, flank pain, frequency, genital sores, hematuria, menstrual problem, pelvic pain, urgency, vaginal bleeding, vaginal discharge and vaginal pain.  Musculoskeletal: Negative for arthralgias, back pain, joint swelling and myalgias.  Skin: Positive for rash.  Neurological: Negative for dizziness, syncope, light-headedness, numbness and headaches.  Hematological: Negative for adenopathy.  Psychiatric/Behavioral: Negative for agitation, confusion, sleep disturbance and suicidal ideas. The patient is not nervous/anxious.      Objective: BP 110/80   Ht 5\' 5"  (1.651 m)   Wt 234 lb (106.1 kg)   BMI 38.94 kg/m    Physical Exam Constitutional:      Appearance: She  is well-developed.  Genitourinary:     Vulva, vagina, cervix, right adnexa and left adnexa normal.     No vaginal discharge, erythema or tenderness.     Uterus is absent.     No right or left adnexal mass present.     Right adnexa not tender.     Left adnexa not tender.     Genitourinary Comments: UTERUS SURG REM  Neck:     Musculoskeletal: Normal range of motion.     Thyroid: No thyromegaly.  Cardiovascular:     Rate and Rhythm: Normal rate and regular rhythm.     Heart sounds: Normal heart sounds. No murmur.  Pulmonary:     Effort: Pulmonary effort is normal.     Breath sounds: Normal breath sounds.  Chest:     Breasts:        Right: No mass, nipple discharge, skin change or tenderness.        Left: No mass, nipple discharge, skin change or tenderness.  Abdominal:     Palpations: Abdomen is soft.     Tenderness: There is no abdominal tenderness. There is no guarding.  Musculoskeletal: Normal range of motion.  Neurological:     General: No focal deficit present.     Mental Status: She is alert and oriented to person, place, and time.     Cranial Nerves: No cranial nerve deficit.  Skin:    General: Skin is warm and dry.  Psychiatric:        Mood and Affect: Mood normal.        Behavior: Behavior normal.        Thought Content: Thought content normal.        Judgment: Judgment normal.  Vitals signs reviewed.     Assessment/Plan: Encounter for annual routine gynecological examination -   Cervical cancer screening - Plan: Cytology - PAP,  Screening for HPV (human papillomavirus) - Plan: Cytology - PAP,   Screening for breast cancer - Plan: MM 3D SCREEN BREAST BILATERAL, pt to sched mammo  Anxiety - Plan: PARoxetine (PAXIL) 10 MG tablet, Rx RF eRxd. F/u prn.  Blood tests for routine general physical examination - Plan: Comprehensive metabolic panel, Lipid panel, Hemoglobin A1c, Comprehensive metabolic panel, Lipid panel, Hemoglobin A1c,   Screening cholesterol level  - Plan: Lipid panel, Lipid panel,   Screening for diabetes mellitus - Plan: Hemoglobin A1c, Hemoglobin A1c,   BMI 38.0-38.9,adult - Plan: Hemoglobin A1c, Hemoglobin A1c,   Meds ordered this encounter  Medications  . PARoxetine (PAXIL) 10 MG tablet    Sig: Take 1 tablet (10 mg total) by mouth daily.    Dispense:  90 tablet    Refill:  3    Order Specific Question:   Supervising Provider    Answer:   Gae Dry [[id number]]             GYN counsel breast self exam, mammography screening, adequate intake of calcium and vitamin D, diet and exercise     F/U  Return in about 1 year (around 01/27/2020).  Alicia B. Copland, PA-C 01/27/2019 12:17 PM

## 2019-01-27 ENCOUNTER — Encounter: Payer: Self-pay | Admitting: Obstetrics and Gynecology

## 2019-01-27 ENCOUNTER — Other Ambulatory Visit: Payer: Self-pay

## 2019-01-27 ENCOUNTER — Ambulatory Visit (INDEPENDENT_AMBULATORY_CARE_PROVIDER_SITE_OTHER): Payer: 59 | Admitting: Obstetrics and Gynecology

## 2019-01-27 ENCOUNTER — Other Ambulatory Visit (HOSPITAL_COMMUNITY)
Admission: RE | Admit: 2019-01-27 | Discharge: 2019-01-27 | Disposition: A | Discharge status: Home / Self Care | Payer: 59 | Source: Ambulatory Visit | Attending: Obstetrics and Gynecology | Admitting: Obstetrics and Gynecology

## 2019-01-27 VITALS — BP 110/80 | Ht 65.0 in | Wt 234.0 lb

## 2019-01-27 DIAGNOSIS — Z13220 Encounter for screening for lipoid disorders: Secondary | ICD-10-CM

## 2019-01-27 DIAGNOSIS — Z131 Encounter for screening for diabetes mellitus: Secondary | ICD-10-CM

## 2019-01-27 DIAGNOSIS — Z124 Encounter for screening for malignant neoplasm of cervix: Secondary | ICD-10-CM

## 2019-01-27 DIAGNOSIS — Z0000 Encounter for general adult medical examination without abnormal findings: Secondary | ICD-10-CM

## 2019-01-27 DIAGNOSIS — Z01419 Encounter for gynecological examination (general) (routine) without abnormal findings: Secondary | ICD-10-CM | POA: Diagnosis not present

## 2019-01-27 DIAGNOSIS — Z1151 Encounter for screening for human papillomavirus (HPV): Secondary | ICD-10-CM

## 2019-01-27 DIAGNOSIS — F419 Anxiety disorder, unspecified: Secondary | ICD-10-CM

## 2019-01-27 DIAGNOSIS — Z1239 Encounter for other screening for malignant neoplasm of breast: Secondary | ICD-10-CM

## 2019-01-27 DIAGNOSIS — Z6838 Body mass index (BMI) 38.0-38.9, adult: Secondary | ICD-10-CM

## 2019-01-27 MED ORDER — PAROXETINE HCL 10 MG PO TABS: 10.0000 mg | ORAL_TABLET | Freq: Every day | ORAL | 3 refills | Status: DC

## 2019-01-27 NOTE — Patient Instructions (Signed)
I value your feedback and entrusting us with your care. If you get a [facility] patient survey, I would appreciate you taking the time to let us know about your experience today. Thank you!  [facility]: [phone]    

## 2019-01-28 LAB — COMPREHENSIVE METABOLIC PANEL
ALT: 14 IU/L (ref 0–32)
AST: 18 IU/L (ref 0–40)
Albumin/Globulin Ratio: 1.7 (ref 1.2–2.2)
Albumin: 4 g/dL (ref 3.8–4.9)
Alkaline Phosphatase: 68 IU/L (ref 39–117)
BUN/Creatinine Ratio: 12 (ref 9–23)
BUN: 9 mg/dL (ref 6–24)
Bilirubin Total: 0.4 mg/dL (ref 0.0–1.2)
CO2: 24 mmol/L (ref 20–29)
Calcium: 9.3 mg/dL (ref 8.7–10.2)
Chloride: 100 mmol/L (ref 96–106)
Creatinine, Ser: 0.78 mg/dL (ref 0.57–1.00)
GFR calc Af Amer: 100 mL/min/{1.73_m2} (ref >59)
GFR calc non Af Amer: 87 mL/min/{1.73_m2} (ref >59)
Globulin, Total: 2.4 g/dL (ref 1.5–4.5)
Glucose: 92 mg/dL (ref 65–99)
Potassium: 4.1 mmol/L (ref 3.5–5.2)
Sodium: 138 mmol/L (ref 134–144)
Total Protein: 6.4 g/dL (ref 6.0–8.5)

## 2019-01-28 LAB — LIPID PANEL
Chol/HDL Ratio: 4.7 ratio — ABNORMAL HIGH (ref 0.0–4.4)
Cholesterol, Total: 211 mg/dL — ABNORMAL HIGH (ref 100–199)
HDL: 45 mg/dL (ref >39)
LDL Calculated: 145 mg/dL — ABNORMAL HIGH (ref 0–99)
Triglycerides: 107 mg/dL (ref 0–149)
VLDL Cholesterol Cal: 21 mg/dL (ref 5–40)

## 2019-01-28 LAB — CYTOLOGY - PAP
Diagnosis: NEGATIVE
HPV: NOT DETECTED

## 2019-01-28 LAB — HEMOGLOBIN A1C
Est. average glucose Bld gHb Est-mCnc: 111 mg/dL
Hgb A1c MFr Bld: 5.5 % (ref 4.8–5.6)

## 2019-03-31 ENCOUNTER — Ambulatory Visit (INDEPENDENT_AMBULATORY_CARE_PROVIDER_SITE_OTHER): Payer: 59 | Admitting: Family Medicine

## 2019-03-31 ENCOUNTER — Encounter: Payer: Self-pay | Admitting: Family Medicine

## 2019-03-31 VITALS — Ht 65.0 in | Wt 235.0 lb

## 2019-03-31 DIAGNOSIS — R197 Diarrhea, unspecified: Secondary | ICD-10-CM | POA: Diagnosis not present

## 2019-03-31 DIAGNOSIS — R232 Flushing: Secondary | ICD-10-CM | POA: Diagnosis not present

## 2019-03-31 DIAGNOSIS — F430 Acute stress reaction: Secondary | ICD-10-CM

## 2019-03-31 DIAGNOSIS — R42 Dizziness and giddiness: Secondary | ICD-10-CM

## 2019-03-31 NOTE — Progress Notes (Signed)
Virtual Visit via Video Note  I connected with Amanda Dixon on 03/31/19 at  2:00 PM EDT by a video enabled telemedicine application and verified that I am speaking with the correct person using two identifiers.  Location: Patient: In her home Provider: [place]   I discussed the limitations of evaluation and management by telemedicine and the availability of in person appointments. The patient expressed understanding and agreed to proceed.  History of Present Illness: Chief Complaint  Patient presents with  . Dizziness    Pt c/o BS dropping causing some nausea, clammy/sweating with episodes, some dizziness and diarrhea. Pt also concerned of possible menopausal symptoms - hotflashes, sweats, clammy skin.  All symptoms worse with stressful situations. Pt states that she will "pass out" from being so exhausted after these episodes.    This is a 53 yo female, patient of Allie Bossier, NP, who requests virtual visit for the above complaints.  Hypoglycemia- she reports having this for many years.  She will have episodes of feeling sweaty, clammy and dizzy.  She has had 2 episodes in the past week.  In the past she has checked her blood sugar with her sister's glucometer and occasionally will run 50-60 when she is having the symptoms.  These episodes resolved with eating.  She reports 2 episodes this week where she was exhausted and feeling sick and went to the bathroom and then woke up not remembering what had happened.  She reports that these occurred following incredibly high stress days where she was exhausted and had not eaten very much.  These episodes always occurred at night.  She reports that she knows what to do to maintain her blood sugar but has not been very consistent about eating regularly and making good food choices.  She is under a tremendous amount of stress caring for her elderly parents, her grandmother and homeschooling her 68-year-old granddaughter while working and going to  school herself.  Hot flashes- she had hysterectomy in 2008.  She was intermittently on an estrogen patch.  She is also been on peroxide teen since 2015 which did help her hot flashes initially.  She reports daily episodes of vasomotor symptoms and upset stomach.  She had multiple episodes of diarrhea over the last couple of days.  She reports it is not uncommon for her to have stomach symptoms with increased stress.  She is currently taking Pepcid once a day.  She denies chest pain, palpitations, shortness of breath, fevers.  She had labs done in the beginning of September which included a normal hemoglobin A1c and CMP. Past Medical History:  Diagnosis Date  . Asthma   . Sinus trouble    Past Surgical History:  Procedure Laterality Date  . ABDOMINAL HYSTERECTOMY  2008   supracervical  . CESAREAN SECTION    . CHOLECYSTECTOMY  1996/97?  . TONSILLECTOMY     Family History  Problem Relation Age of Onset  . Heart disease Maternal Grandfather   . Emphysema Maternal Grandfather   . Asthma Maternal Grandfather   . Heart disease Paternal Grandfather   . Heart disease Father        multpile bypass  . Emphysema Paternal Grandmother   . Diabetes Paternal Grandmother   . Allergies Sister   . Diabetes Sister   . Asthma Sister   . Eczema Sister   . Allergies Son   . Allergies Mother   . Allergies Other        mothers side  .  Asthma Other        father side  . Asthma Maternal Aunt   . Diabetes Paternal Aunt   . Diabetes Paternal Uncle   . Breast cancer Neg Hx    Social History   Tobacco Use  . Smoking status: Never Smoker  . Smokeless tobacco: Never Used  . Tobacco comment: NEVER SMOKER:  EXPOSED TO 2ND(HUSBAND) X SEVERAL YEARS.   Substance Use Topics  . Alcohol use: No  . Drug use: No      Observations/Objective: Patient is alert and answers questions appropriately. visible skin is unremarkable.  Conjunctiva are clear.  She is normally conversive without shortness of breath,  audible wheeze or witnessed cough.  She appears mildly anxious. Ht 5\' 5"  (1.651 m)   Wt 235 lb (106.6 kg)   BMI 39.11 kg/m  Wt Readings from Last 3 Encounters:  03/31/19 235 lb (106.6 kg)  01/27/19 234 lb (106.1 kg)  01/25/19 234 lb 8 oz (106.4 kg)    Assessment and Plan: 1. Vasomotor flushing -We will check her FSH to see if she is postmenopausal and determine further treatment, may need to go back on estrogen - Follicle stimulating hormone; Future  2. Diarrhea, unspecified type -Symptoms consistent with her previous irritable bowel syndrome -Encouraged continued adequate fluid intake, notify office if no improvement over the next 24 to 48 hours or any difficulty keeping down liquids - TSH; Future  3. Dizziness -Is hard to know if this is true hypoglycemia as she has not recently checked her blood sugars during an episode. -Discussed importance of eating regular balanced meals  4. Stress reaction -She is under tremendous amount of stress and we discussed importance of self-care measures including eating regularly, getting adequate rest and having some time to herself for relaxation and rejuvenation  -Follow-up to be determined based on labs Olean Reeeborah , FNP-BC  [facility]  04/01/2019 8:15 AM   Follow Up Instructions: I have encouraged her to sign up for her MyChart for further communication   I discussed the assessment and treatment plan with the patient. The patient was provided an opportunity to ask questions and all were answered. The patient agreed with the plan and demonstrated an understanding of the instructions.   The patient was advised to call back or seek an in-person evaluation if the symptoms worsen or if the condition fails to improve as anticipated.   Emi Belfasteborah B , FNP

## 2019-04-01 ENCOUNTER — Encounter: Payer: Self-pay | Admitting: Family Medicine

## 2019-07-09 ENCOUNTER — Other Ambulatory Visit: Payer: Self-pay

## 2019-07-09 ENCOUNTER — Ambulatory Visit (INDEPENDENT_AMBULATORY_CARE_PROVIDER_SITE_OTHER): Payer: 59 | Admitting: Primary Care

## 2019-07-09 ENCOUNTER — Encounter: Payer: Self-pay | Admitting: Primary Care

## 2019-07-09 VITALS — Ht 65.0 in | Wt 230.0 lb

## 2019-07-09 DIAGNOSIS — F419 Anxiety disorder, unspecified: Secondary | ICD-10-CM | POA: Diagnosis not present

## 2019-07-09 DIAGNOSIS — J45990 Exercise induced bronchospasm: Secondary | ICD-10-CM

## 2019-07-09 DIAGNOSIS — J302 Other seasonal allergic rhinitis: Secondary | ICD-10-CM | POA: Diagnosis not present

## 2019-07-09 DIAGNOSIS — R053 Chronic cough: Secondary | ICD-10-CM

## 2019-07-09 DIAGNOSIS — R05 Cough: Secondary | ICD-10-CM

## 2019-07-09 MED ORDER — GUAIFENESIN-CODEINE 100-10 MG/5ML PO SYRP: 5.0000 mL | ORAL_SOLUTION | Freq: Three times a day (TID) | ORAL | 0 refills | Status: DC | PRN

## 2019-07-09 NOTE — Assessment & Plan Note (Signed)
Stopped gabapentin as she didn't feel that it worked.  Suspect allergies are contributing to her chronic cough, may also be aggravating GERD causing reflux.   Continue daily antihistamine. Discussed potentially needing to resume PPI. Continue famotidine for now.  Rx for cheratussin provided, discussed this is not a routine medication, she will update.

## 2019-07-09 NOTE — Assessment & Plan Note (Signed)
Chronic, continued.  Do suspect this is uncontrolled but she declines further treatment.

## 2019-07-09 NOTE — Progress Notes (Signed)
Subjective:    Patient ID: Amanda Dixon, female    DOB: 1966-06-10, 53 y.o.   MRN: [id number]  HPI  Virtual Visit via Video Note  I connected with Amanda Dixon on 07/09/19 at 10:20 AM EST by a video enabled telemedicine application and verified that I am speaking with the correct person using two identifiers.  Location: Patient: Home Provider: Office   I discussed the limitations of evaluation and management by telemedicine and the availability of in person appointments. The patient expressed understanding and agreed to proceed.  History of Present Illness:  Amanda Dixon is a 53 year old female who presents today for follow up.  1) GERD/Chronic Cough: History of chronic cough. Previously prescribed pantoprazole 40 mg daily and famotidine. Evaluated by pulmonology in September 2019 and ENT in October 2019. Managed on benzonatate and gabapentin at times, doesn't think that gabapentin was helpful.  Cough has been intermittent and chronic for years, typically during seasonal changes in the Fall. Recently her cough began in October 2020, overall okay during the day, more bothersome at night. She's taking OTC cough suppressants with some help but without resolve. She was provided with guaifenesin with codeine before which "knocks out" the cough. She would like to try this. She has not taken her pantoprazole in months, does take famotidine.   2) Anxiety: Currently managed on paroxetine 10 mg. Over the last one year she's had intermittent increases in anxiety. She's tried an increase in this dose which caused her "not to feel anything". She believes her anxiety is situational due to school and work. She feels that she manages well on the 10 mg dose.   3) Asthma: Currently managed on Breo and albuterol inhaler for which she uses PRN with exercise and seasonal changes. Also on daily antihistamine. She was told that her exercise induced asthma was secondary to her obesity. Following with Dr.  Melvyn Dixon in September 2019, has not been back for follow up.   Observations/Objective:  Alert and oriented. Appears well, not sickly. No distress. Speaking in complete sentences.   Assessment and Plan:  See problem based charting.  Follow Up Instructions:  You may take the cough suppressant every 8 hours as needed for cough and rest. Caution this medication contains codeine which may cause drowsiness.   Please update me as discussed.  It was a pleasure to see you today! Amanda Bossier, NP-C    I discussed the assessment and treatment plan with the patient. The patient was provided an opportunity to ask questions and all were answered. The patient agreed with the plan and demonstrated an understanding of the instructions.   The patient was advised to call back or seek an in-person evaluation if the symptoms worsen or if the condition fails to improve as anticipated.    Amanda Koch, NP    Review of Systems  Constitutional: Negative for fever.  Respiratory: Positive for cough. Negative for shortness of breath and wheezing.   Allergic/Immunologic: Positive for environmental allergies.  Psychiatric/Behavioral: The patient is nervous/anxious.        See HPI       Past Medical History:  Diagnosis Date  . Asthma   . Sinus trouble      Social History   Socioeconomic History  . Marital status: Married    Spouse name: Not on file  . Number of children: Not on file  . Years of education: Not on file  . Highest education level: Not on file  Occupational History  . Occupation: Research scientist (medical)executive assistant  Tobacco Use  . Smoking status: Never Smoker  . Smokeless tobacco: Never Used  . Tobacco comment: NEVER SMOKER:  EXPOSED TO 2ND(HUSBAND) X SEVERAL YEARS.   Substance and Sexual Activity  . Alcohol use: No  . Drug use: No  . Sexual activity: Yes    Partners: Male    Birth control/protection: Surgical    Comment: Hysterectomy   Other Topics Concern  . Not on file  Social  History Narrative  . Not on file   Social Determinants of Health   Financial Resource Strain:   . Difficulty of Paying Living Expenses: Not on file  Food Insecurity:   . Worried About Programme researcher, broadcasting/film/videounning Out of Food in the Last Year: Not on file  . Ran Out of Food in the Last Year: Not on file  Transportation Needs:   . Lack of Transportation (Medical): Not on file  . Lack of Transportation (Non-Medical): Not on file  Physical Activity:   . Days of Exercise per Week: Not on file  . Minutes of Exercise per Session: Not on file  Stress:   . Feeling of Stress : Not on file  Social Connections:   . Frequency of Communication with Friends and Family: Not on file  . Frequency of Social Gatherings with Friends and Family: Not on file  . Attends Religious Services: Not on file  . Active Member of Clubs or Organizations: Not on file  . Attends BankerClub or Organization Meetings: Not on file  . Marital Status: Not on file  Intimate Partner Violence:   . Fear of Current or Ex-Partner: Not on file  . Emotionally Abused: Not on file  . Physically Abused: Not on file  . Sexually Abused: Not on file    Past Surgical History:  Procedure Laterality Date  . ABDOMINAL HYSTERECTOMY  2008   supracervical  . CESAREAN SECTION    . CHOLECYSTECTOMY  1996/97?  . TONSILLECTOMY      Family History  Problem Relation Age of Onset  . Heart disease Maternal Grandfather   . Emphysema Maternal Grandfather   . Asthma Maternal Grandfather   . Heart disease Paternal Grandfather   . Heart disease Father        multpile bypass  . Emphysema Paternal Grandmother   . Diabetes Paternal Grandmother   . Allergies Sister   . Diabetes Sister   . Asthma Sister   . Eczema Sister   . Allergies Son   . Allergies Mother   . Allergies Other        mothers side  . Asthma Other        father side  . Asthma Maternal Aunt   . Diabetes Paternal Aunt   . Diabetes Paternal Uncle   . Breast cancer Neg Hx     Allergies    Allergen Reactions  . Other Swelling    Bees/Wasps- redness    Current Outpatient Medications on File Prior to Visit  Medication Sig Dispense Refill  . albuterol (PROVENTIL HFA;VENTOLIN HFA) 108 (90 Base) MCG/ACT inhaler Inhale 2 puffs into the lungs every 6 (six) hours as needed for wheezing. 1 Inhaler 2  . chlorpheniramine (CHLOR-TRIMETON) 4 MG tablet Take 8 mg by mouth at bedtime.    Marland Kitchen. EPINEPHrine 0.3 mg/0.3 mL IJ SOAJ injection Inject 0.3 mLs (0.3 mg total) into the muscle as needed for anaphylaxis. 1 each 0  . famotidine (PEPCID) 20 MG tablet One at bedtime 30 tablet 11  .  PARoxetine (PAXIL) 10 MG tablet Take 1 tablet (10 mg total) by mouth daily. 90 tablet 3  . triamcinolone (NASACORT AQ) 55 MCG/ACT nasal inhaler Place 2 sprays into the nose daily. 1 Inhaler 0   No current facility-administered medications on file prior to visit.    Ht 5\' 5"  (1.651 m)   Wt 230 lb (104.3 kg)   BMI 38.27 kg/m    Objective:   Physical Exam  Constitutional: She is oriented to person, place, and time. She appears well-nourished.  Respiratory: Effort normal.  No cough during exam  Neurological: She is alert and oriented to person, place, and time.  Psychiatric: She has a normal mood and affect.           Assessment & Plan:

## 2019-07-09 NOTE — Assessment & Plan Note (Signed)
Suspect this is contributing to her chronic cough, may also be aggravating GERD causing reflux.   Continue daily antihistamine. Discussed potentially needing to resume PPI. Continue famotidine for now.  Rx for cheratussin provided, discussed this is not a routine medication, she will update.

## 2019-07-09 NOTE — Patient Instructions (Signed)
You may take the cough suppressant every 8 hours as needed for cough and rest. Caution this medication contains codeine which may cause drowsiness.   Please update me as discussed.  It was a pleasure to see you today! Allie Bossier, NP-C

## 2019-07-09 NOTE — Assessment & Plan Note (Signed)
Using albuterol inhaler and Breo PRN. Continue same.

## 2019-07-27 ENCOUNTER — Telehealth: Payer: Self-pay | Admitting: Primary Care

## 2019-07-27 NOTE — Telephone Encounter (Signed)
Patient called today  Scheduled appointment for tomorrow with Dr Diona Browner @ 9:20 Patient stated that where she hit her arm there is some tingling. Spoke with Mearl Latin we scheduled the patient and advised her that if the tingling gets worse or something changes over night to go to her nearest ED or UC

## 2019-07-28 ENCOUNTER — Encounter: Payer: Self-pay | Admitting: Family Medicine

## 2019-07-28 ENCOUNTER — Ambulatory Visit: Payer: 59

## 2019-07-28 ENCOUNTER — Ambulatory Visit (INDEPENDENT_AMBULATORY_CARE_PROVIDER_SITE_OTHER): Payer: Managed Care, Other (non HMO) | Admitting: Family Medicine

## 2019-07-28 ENCOUNTER — Other Ambulatory Visit: Payer: Self-pay

## 2019-07-28 VITALS — BP 120/90 | HR 85 | Temp 98.1°F | Ht 65.0 in | Wt 234.8 lb

## 2019-07-28 DIAGNOSIS — G5621 Lesion of ulnar nerve, right upper limb: Secondary | ICD-10-CM

## 2019-07-28 DIAGNOSIS — M7711 Lateral epicondylitis, right elbow: Secondary | ICD-10-CM | POA: Diagnosis not present

## 2019-07-28 NOTE — Progress Notes (Signed)
Chief Complaint  Patient presents with  . Elbow Pain    Right  . Hand Pain    Right    History of Present Illness: HPI   53 year old pt of Amanda Dixon female presents for new onset pain in right elbow and hand. Pain starts laterally in elbow.. radiates from elbow to hand.   Started  After being hit in lateral elbow with door closing 05/17/2019.  Briusing and knot formed.  Pain in pinky side of hand. Numbness in  Pinky side of hand.  Triggered with twist.  Pain with brushing hair, decreased grip strength at times.  triggered pain with leaning on elbow.   She has iced the area. occ NSAIDs her and there.  Hx of : similar mild self limited issues last year.    This visit occurred during the SARS-CoV-2 public health emergency.  Safety protocols were in place, including screening questions prior to the visit, additional usage of staff PPE, and extensive cleaning of exam room while observing appropriate contact time as indicated for disinfecting solutions.   COVID 19 screen:  No recent travel or known exposure to COVID19 The patient denies respiratory symptoms of COVID 19 at this time. The importance of social distancing was discussed today.     Review of Systems  Constitutional: Negative for chills and fever.  HENT: Negative for congestion and ear pain.   Eyes: Negative for pain and redness.  Respiratory: Negative for cough and shortness of breath.   Cardiovascular: Negative for chest pain, palpitations and leg swelling.  Gastrointestinal: Negative for abdominal pain, blood in stool, constipation, diarrhea, nausea and vomiting.  Genitourinary: Negative for dysuria.  Musculoskeletal: Negative for falls and myalgias.  Skin: Negative for rash.  Neurological: Negative for dizziness.  Psychiatric/Behavioral: Negative for depression. The patient is not nervous/anxious.       Past Medical History:  Diagnosis Date  . Asthma   . Sinus trouble     reports that she has never  smoked. She has never used smokeless tobacco. She reports that she does not drink alcohol or use drugs.   Current Outpatient Medications:  .  albuterol (PROAIR HFA) 108 (90 Base) MCG/ACT inhaler, Inhale into the lungs every 6 (six) hours as needed for wheezing or shortness of breath., Disp: , Rfl:  .  chlorpheniramine (CHLOR-TRIMETON) 4 MG tablet, Take 8 mg by mouth at bedtime., Disp: , Rfl:  .  EPINEPHrine 0.3 mg/0.3 mL IJ SOAJ injection, Inject 0.3 mLs (0.3 mg total) into the muscle as needed for anaphylaxis., Disp: 1 each, Rfl: 0 .  famotidine (PEPCID) 20 MG tablet, One at bedtime, Disp: 30 tablet, Rfl: 11 .  fluticasone furoate-vilanterol (BREO ELLIPTA) 200-25 MCG/INH AEPB, Inhale 1 puff into the lungs daily., Disp: , Rfl:  .  PARoxetine (PAXIL) 10 MG tablet, Take 1 tablet (10 mg total) by mouth daily., Disp: 90 tablet, Rfl: 3 .  triamcinolone (NASACORT AQ) 55 MCG/ACT nasal inhaler, Place 2 sprays into the nose daily., Disp: 1 Inhaler, Rfl: 0   Observations/Objective: Blood pressure 120/90, pulse 85, temperature 98.1 F (36.7 C), temperature source Temporal, height 5\' 5"  (1.651 m), weight 234 lb 12 oz (106.5 kg), SpO2 97 %.  Physical Exam Constitutional:      General: She is not in acute distress.    Appearance: Normal appearance. She is well-developed. She is not ill-appearing or toxic-appearing.  HENT:     Head: Normocephalic.     Right Ear: Hearing, tympanic membrane, ear canal and  external ear normal. Tympanic membrane is not erythematous, retracted or bulging.     Left Ear: Hearing, tympanic membrane, ear canal and external ear normal. Tympanic membrane is not erythematous, retracted or bulging.     Nose: No mucosal edema or rhinorrhea.     Right Sinus: No maxillary sinus tenderness or frontal sinus tenderness.     Left Sinus: No maxillary sinus tenderness or frontal sinus tenderness.     Mouth/Throat:     Pharynx: Uvula midline.  Eyes:     General: Lids are normal. Lids are  everted, no foreign bodies appreciated.     Conjunctiva/sclera: Conjunctivae normal.     Pupils: Pupils are equal, round, and reactive to light.  Neck:     Thyroid: No thyroid mass or thyromegaly.     Vascular: No carotid bruit.     Trachea: Trachea normal.  Cardiovascular:     Rate and Rhythm: Normal rate and regular rhythm.     Pulses: Normal pulses.     Heart sounds: Normal heart sounds, S1 normal and S2 normal. No murmur. No friction rub. No gallop.   Pulmonary:     Effort: Pulmonary effort is normal. No tachypnea or respiratory distress.     Breath sounds: Normal breath sounds. No decreased breath sounds, wheezing, rhonchi or rales.  Abdominal:     General: Bowel sounds are normal.     Palpations: Abdomen is soft.     Tenderness: There is no abdominal tenderness.  Musculoskeletal:     Right shoulder: Normal.     Left shoulder: Normal.     Right upper arm: Normal.     Left upper arm: Normal.     Right elbow: No swelling, deformity or effusion. Normal range of motion. Tenderness present in lateral epicondyle. No radial head, medial epicondyle or olecranon process tenderness.     Left elbow: No swelling, deformity or effusion. Normal range of motion. No tenderness. No radial head, medial epicondyle or olecranon process tenderness.     Right wrist: No tenderness. Normal range of motion.     Left wrist: No tenderness. Normal range of motion.     Cervical back: Normal range of motion and neck supple.     Comments: Numbness in ulnar distribution on right hand  Skin:    General: Skin is warm and dry.     Findings: No rash.  Neurological:     Mental Status: She is alert.  Psychiatric:        Mood and Affect: Mood is not anxious or depressed.        Speech: Speech normal.        Behavior: Behavior normal. Behavior is cooperative.        Thought Content: Thought content normal.        Judgment: Judgment normal.      Assessment and Plan  Ulnar neuropathy and lateral  epicondylitis:  Treat with home PT, ice, pred taper  Followed by ibuprofen.  Given info on modification of activity, bending.  Follow up in 4 weeks if not improving.   Kerby Nora, MD

## 2019-07-28 NOTE — Patient Instructions (Addendum)
Complete prednisone taper x 6 days then followed by ibuprpfen 800 mg three times daily with meals.  Start home PT.     Modify activity:  Avoid leaning on the elbows when seated or driving and should avoid prolonged elbow flexion . Practical suggestions include using the other hand or a headset when on the telephone and avoiding sitting with the arms crossed. .The use of a soft foam elbow pad may reduce inadvertent compression of the ulnar nerve at the elbow. To prevent excessive or prolonged elbow flexion, splints that limit flexion to 45 to 90 degrees can be used at night, although patient compliance is often suboptimal. You may wrap the affected elbow with a towel at night to limit flexion, since a towel wrap is usually better tolerated than more rigid braces.    Follow up in 2 weeks if not improving.

## 2019-08-04 ENCOUNTER — Telehealth: Payer: Self-pay | Admitting: Primary Care

## 2019-08-04 NOTE — Telephone Encounter (Signed)
Patietn was seen by you on 12/30 for injured elbow/hand The patient stated that the prednisone prescription was not received by her pharmacy and would like to know if this could be sent or resent

## 2019-08-05 MED ORDER — PREDNISONE 20 MG PO TABS: ORAL_TABLET | ORAL | 0 refills | Status: DC

## 2019-08-05 NOTE — Telephone Encounter (Signed)
Amanda Dixon notified by telephone that prednisone prescription has been sent to [facility] on [address].

## 2019-08-05 NOTE — Telephone Encounter (Signed)
Let pt know rx sent

## 2019-08-25 ENCOUNTER — Ambulatory Visit
Admission: RE | Admit: 2019-08-25 | Discharge: 2019-08-25 | Disposition: A | Discharge status: Home / Self Care | Payer: Managed Care, Other (non HMO) | Source: Ambulatory Visit | Attending: Obstetrics and Gynecology | Admitting: Obstetrics and Gynecology

## 2019-08-25 DIAGNOSIS — Z1231 Encounter for screening mammogram for malignant neoplasm of breast: Secondary | ICD-10-CM | POA: Insufficient documentation

## 2019-08-25 DIAGNOSIS — Z1239 Encounter for other screening for malignant neoplasm of breast: Secondary | ICD-10-CM

## 2019-08-26 ENCOUNTER — Encounter: Payer: Self-pay | Admitting: Obstetrics and Gynecology

## 2019-09-20 ENCOUNTER — Encounter: Payer: Self-pay | Admitting: Primary Care

## 2019-09-20 ENCOUNTER — Ambulatory Visit (INDEPENDENT_AMBULATORY_CARE_PROVIDER_SITE_OTHER): Payer: Managed Care, Other (non HMO) | Admitting: Primary Care

## 2019-09-20 ENCOUNTER — Other Ambulatory Visit: Payer: Self-pay

## 2019-09-20 VITALS — BP 122/86 | HR 71 | Temp 97.4°F | Ht 65.0 in | Wt 234.2 lb

## 2019-09-20 DIAGNOSIS — F419 Anxiety disorder, unspecified: Secondary | ICD-10-CM

## 2019-09-20 DIAGNOSIS — G43709 Chronic migraine without aura, not intractable, without status migrainosus: Secondary | ICD-10-CM | POA: Diagnosis not present

## 2019-09-20 DIAGNOSIS — R609 Edema, unspecified: Secondary | ICD-10-CM

## 2019-09-20 DIAGNOSIS — R600 Localized edema: Secondary | ICD-10-CM | POA: Insufficient documentation

## 2019-09-20 LAB — CBC
HCT: 39.9 % (ref 36.0–46.0)
Hemoglobin: 13.4 g/dL (ref 12.0–15.0)
MCHC: 33.5 g/dL (ref 30.0–36.0)
MCV: 91.3 fl (ref 78.0–100.0)
Platelets: 356 10*3/uL (ref 150.0–400.0)
RBC: 4.37 Mil/uL (ref 3.87–5.11)
RDW: 14.1 % (ref 11.5–15.5)
WBC: 9 10*3/uL (ref 4.0–10.5)

## 2019-09-20 LAB — TSH: TSH: 3.79 u[IU]/mL (ref 0.35–4.50)

## 2019-09-20 MED ORDER — SUMATRIPTAN SUCCINATE 50 MG PO TABS: ORAL_TABLET | ORAL | 0 refills | Status: DC

## 2019-09-20 NOTE — Progress Notes (Signed)
Subjective:    Patient ID: Amanda Dixon, female    DOB: 11-04-1965, 54 y.o.   MRN: [id number]  HPI  This visit occurred during the SARS-CoV-2 public health emergency.  Safety protocols were in place, including screening questions prior to the visit, additional usage of staff PPE, and extensive cleaning of exam room while observing appropriate contact time as indicated for disinfecting solutions.   Amanda Dixon is a 53 year old female with a history of anxiety, hypersomnia, seasonal allergies, hysterectomy, mild exercise induced asthma who presents today with multiple symptoms.  Over the last month she's not felt well. Symptoms include swelling to her fingers and feet; migraine headaches located to bilateral temporal region and left occipital region with photophobia, nausea, phonophobia. She has a prior history of migraines and was treated with sumatriptan and anti-nausea medication, hasn't been affected by migraines in six years. This last week she had a migraine for 3-4 days consistently, had to lay in a dark environment as Excedrin Migraine, Tylenol wasn't helping. Also with abdominal bloating, feels like abdomen has been distended. Also with little motivation to do anything, hot flashes, night sweats. Hysterectomy in 2008.    She's working from home which has been challenging as she's a social person. The weather has been so dark and gloomy so she hasn't been able to get outside. Overall she feels as though her paroxetine is effective, doesn't wish to change the dose.   She believes her symptoms are a combination of hormonal and migraine cause. Today she is feeling better.   Wt Readings from Last 3 Encounters:  09/20/19 234 lb 4 oz (106.3 kg)  07/28/19 234 lb 12 oz (106.5 kg)  07/09/19 230 lb (104.3 kg)     Review of Systems  Eyes: Positive for photophobia.  Respiratory: Negative for shortness of breath.   Cardiovascular: Positive for leg swelling. Negative for chest pain.    Gastrointestinal: Negative for abdominal pain.  Neurological: Positive for headaches.       Past Medical History:  Diagnosis Date  . Asthma   . Sinus trouble      Social History   Socioeconomic History  . Marital status: Married    Spouse name: Not on file  . Number of children: Not on file  . Years of education: Not on file  . Highest education level: Not on file  Occupational History  . Occupation: Research scientist (medical)  Tobacco Use  . Smoking status: Never Smoker  . Smokeless tobacco: Never Used  . Tobacco comment: NEVER SMOKER:  EXPOSED TO 2ND(HUSBAND) X SEVERAL YEARS.   Substance and Sexual Activity  . Alcohol use: No  . Drug use: No  . Sexual activity: Yes    Partners: Male    Birth control/protection: Surgical    Comment: Hysterectomy   Other Topics Concern  . Not on file  Social History Narrative  . Not on file   Social Determinants of Health   Financial Resource Strain:   . Difficulty of Paying Living Expenses: Not on file  Food Insecurity:   . Worried About Programme researcher, broadcasting/film/video in the Last Year: Not on file  . Ran Out of Food in the Last Year: Not on file  Transportation Needs:   . Lack of Transportation (Medical): Not on file  . Lack of Transportation (Non-Medical): Not on file  Physical Activity:   . Days of Exercise per Week: Not on file  . Minutes of Exercise per Session: Not on file  Stress:   . Feeling of Stress : Not on file  Social Connections:   . Frequency of Communication with Friends and Family: Not on file  . Frequency of Social Gatherings with Friends and Family: Not on file  . Attends Religious Services: Not on file  . Active Member of Clubs or Organizations: Not on file  . Attends Archivist Meetings: Not on file  . Marital Status: Not on file  Intimate Partner Violence:   . Fear of Current or Ex-Partner: Not on file  . Emotionally Abused: Not on file  . Physically Abused: Not on file  . Sexually Abused: Not on file     Past Surgical History:  Procedure Laterality Date  . ABDOMINAL HYSTERECTOMY  2008   supracervical  . CESAREAN SECTION    . CHOLECYSTECTOMY  1996/97?  . TONSILLECTOMY      Family History  Problem Relation Age of Onset  . Heart disease Maternal Grandfather   . Emphysema Maternal Grandfather   . Asthma Maternal Grandfather   . Heart disease Paternal Grandfather   . Heart disease Father        multpile bypass  . Emphysema Paternal Grandmother   . Diabetes Paternal Grandmother   . Allergies Sister   . Diabetes Sister   . Asthma Sister   . Eczema Sister   . Allergies Son   . Allergies Mother   . Allergies Other        mothers side  . Asthma Other        father side  . Asthma Maternal Aunt   . Diabetes Paternal Aunt   . Diabetes Paternal Uncle   . Breast cancer Neg Hx     Allergies  Allergen Reactions  . Other Swelling    Bees/Wasps- redness    Current Outpatient Medications on File Prior to Visit  Medication Sig Dispense Refill  . albuterol (PROAIR HFA) 108 (90 Base) MCG/ACT inhaler Inhale into the lungs every 6 (six) hours as needed for wheezing or shortness of breath.    . chlorpheniramine (CHLOR-TRIMETON) 4 MG tablet Take 8 mg by mouth at bedtime.    Marland Kitchen EPINEPHrine 0.3 mg/0.3 mL IJ SOAJ injection Inject 0.3 mLs (0.3 mg total) into the muscle as needed for anaphylaxis. 1 each 0  . famotidine (PEPCID) 20 MG tablet One at bedtime 30 tablet 11  . fluorometholone (FML) 0.1 % ophthalmic suspension SMARTSIG:2 Drop(s) In Eye(s) 4 Times Daily PRN    . fluticasone furoate-vilanterol (BREO ELLIPTA) 200-25 MCG/INH AEPB Inhale 1 puff into the lungs daily.    Marland Kitchen PARoxetine (PAXIL) 10 MG tablet Take 1 tablet (10 mg total) by mouth daily. 90 tablet 3  . triamcinolone (NASACORT AQ) 55 MCG/ACT nasal inhaler Place 2 sprays into the nose daily. (Patient not taking: Reported on 09/20/2019) 1 Inhaler 0   No current facility-administered medications on file prior to visit.    BP  122/86   Pulse 71   Temp (!) 97.4 F (36.3 C) (Temporal)   Ht 5\' 5"  (1.651 m)   Wt 234 lb 4 oz (106.3 kg)   SpO2 98%   BMI 38.98 kg/m    Objective:   Physical Exam  Constitutional: She appears well-nourished.  Cardiovascular: Normal rate and regular rhythm.  Respiratory: Effort normal and breath sounds normal.  Musculoskeletal:     Cervical back: Neck supple.  Skin: Skin is warm and dry.  Psychiatric: She has a normal mood and affect.  Assessment & Plan:

## 2019-09-20 NOTE — Assessment & Plan Note (Signed)
Suspect symptoms today to also be coming from anxiety, she does feel well managed on paroxetine and doesn't wish to alter her dose. Continue to monitor.  Work on exercise and healthy diet.

## 2019-09-20 NOTE — Assessment & Plan Note (Signed)
No obvious edema on exam, pedal pulses 2+. Encouraged exercise, elevation of extremities, getting up every hour when working from home.  Check TSH, CBC, BMP today.

## 2019-09-20 NOTE — Patient Instructions (Addendum)
Stop by the lab prior to leaving today. I will notify you of your results once received.   You may take sumatriptan 50 mg at migraine onset. May repeat in 2 hours if migraine persists. Do not take more than two tablets in 24 hours.  Elevate your legs when resting.   Start exercising. You should be getting 150 minutes of moderate intensity exercise weekly.  Limit salty foods that will cause swelling.   It was a pleasure to see you today!

## 2019-09-20 NOTE — Assessment & Plan Note (Signed)
Prior history of, no problems for years. Suspect either weather or hormonal cause for recent return.  Rx for sumatriptan sent to pharmacy, she will update if headaches persist and become frequent. Consider daily preventative medication if this is the case.

## 2019-09-21 LAB — BASIC METABOLIC PANEL
BUN: 10 mg/dL (ref 6–23)
CO2: 29 mEq/L (ref 19–32)
Calcium: 9.2 mg/dL (ref 8.4–10.5)
Chloride: 101 mEq/L (ref 96–112)
Creatinine, Ser: 0.69 mg/dL (ref 0.40–1.20)
GFR: 88.73 mL/min (ref >60.00)
Glucose, Bld: 110 mg/dL — ABNORMAL HIGH (ref 70–99)
Potassium: 3.9 mEq/L (ref 3.5–5.1)
Sodium: 139 mEq/L (ref 135–145)

## 2019-10-17 ENCOUNTER — Other Ambulatory Visit: Payer: Self-pay | Admitting: Primary Care

## 2019-10-17 DIAGNOSIS — G43709 Chronic migraine without aura, not intractable, without status migrainosus: Secondary | ICD-10-CM

## 2020-01-25 ENCOUNTER — Encounter: Payer: Self-pay | Admitting: Primary Care

## 2020-01-25 ENCOUNTER — Ambulatory Visit (INDEPENDENT_AMBULATORY_CARE_PROVIDER_SITE_OTHER): Payer: 59 | Admitting: Primary Care

## 2020-01-25 ENCOUNTER — Other Ambulatory Visit: Payer: Self-pay

## 2020-01-25 VITALS — BP 126/82 | HR 86 | Temp 97.6°F | Ht 65.0 in | Wt 229.5 lb

## 2020-01-25 DIAGNOSIS — R609 Edema, unspecified: Secondary | ICD-10-CM

## 2020-01-25 DIAGNOSIS — E669 Obesity, unspecified: Secondary | ICD-10-CM

## 2020-01-25 NOTE — Patient Instructions (Signed)
Stop by the lab prior to leaving today. I will notify you of your results once received.   Start exercising. You should be getting 150 minutes of moderate intensity exercise weekly.  Start working on a healthy diet. Ensure you are consuming 64 ounces of water daily.  It was a pleasure to see you today!

## 2020-01-25 NOTE — Assessment & Plan Note (Signed)
Discussed the importance of a healthy diet and regular exercise in order for weight loss, and to reduce the risk of any potential medical problems.  She will be starting the [employer] diet soon. Encouraged exercise.

## 2020-01-25 NOTE — Assessment & Plan Note (Addendum)
Acute for the last 4-6 weeks, also with exertional shortness of breath and is not using her Breo. Also seems to be going through menopause.  Strongly advised she work on weight loss through diet and exercise. She is motivated to lose weight. Strongly advised she increase water intake, limit soda.   Checking labs today.  No alarm signs on exam.

## 2020-01-25 NOTE — Progress Notes (Signed)
Subjective:    Patient ID: Amanda Dixon, female    DOB: [birth year], 54 y.o.   MRN: [id number]  HPI  This visit occurred during the SARS-CoV-2 public health emergency.  Safety protocols were in place, including screening questions prior to the visit, additional usage of staff PPE, and extensive cleaning of exam room while observing appropriate contact time as indicated for disinfecting solutions.   Amanda Dixon is a 54 year old female with a history of migraines, asthma, anxiety, peripheral edema who presents today with a chief complaint of lower extremity edema.   She will notice "dents" into the skin of her bilateral lower extremities when crossing her legs or when her legs are resting against an object. She first notice about one month ago. Also with hot flashes, weight fluctuations, was told recently that she was "too young" to be going through menopause.   She denies a salty diet, chest pain. She doesn't drink much water, mostly soda. She will be starting the [employer] diet soon. She does notice exertional shortness of breath with mild and moderate exertion.   Wt Readings from Last 3 Encounters:  01/25/20 229 lb 8 oz (104.1 kg)  09/20/19 234 lb 4 oz (106.3 kg)  07/28/19 234 lb 12 oz (106.5 kg)   BP Readings from Last 3 Encounters:  01/25/20 126/82  09/20/19 122/86  07/28/19 120/90     Review of Systems  Eyes: Negative for visual disturbance.  Respiratory: Positive for shortness of breath.   Cardiovascular: Positive for leg swelling. Negative for chest pain.  Neurological: Negative for dizziness and headaches.       Past Medical History:  Diagnosis Date  . Asthma   . Sinus trouble      Social History   Socioeconomic History  . Marital status: Married    Spouse name: Not on file  . Number of children: Not on file  . Years of education: Not on file  . Highest education level: Not on file  Occupational History  . Occupation: Research scientist (medical)  Tobacco Use  .  Smoking status: Never Smoker  . Smokeless tobacco: Never Used  . Tobacco comment: NEVER SMOKER:  EXPOSED TO 2ND(HUSBAND) X SEVERAL YEARS.   Vaping Use  . Vaping Use: Never used  Substance and Sexual Activity  . Alcohol use: No  . Drug use: No  . Sexual activity: Yes    Partners: Male    Birth control/protection: Surgical    Comment: Hysterectomy   Other Topics Concern  . Not on file  Social History Narrative  . Not on file   Social Determinants of Health   Financial Resource Strain:   . Difficulty of Paying Living Expenses:   Food Insecurity:   . Worried About Programme researcher, broadcasting/film/video in the Last Year:   . Barista in the Last Year:   Transportation Needs:   . Freight forwarder (Medical):   Marland Kitchen Lack of Transportation (Non-Medical):   Physical Activity:   . Days of Exercise per Week:   . Minutes of Exercise per Session:   Stress:   . Feeling of Stress :   Social Connections:   . Frequency of Communication with Friends and Family:   . Frequency of Social Gatherings with Friends and Family:   . Attends Religious Services:   . Active Member of Clubs or Organizations:   . Attends Banker Meetings:   Marland Kitchen Marital Status:   Intimate Partner Violence:   .  Fear of Current or Ex-Partner:   . Emotionally Abused:   Marland Kitchen Physically Abused:   . Sexually Abused:     Past Surgical History:  Procedure Laterality Date  . ABDOMINAL HYSTERECTOMY  2008   supracervical  . CESAREAN SECTION    . CHOLECYSTECTOMY  1996/97?  . TONSILLECTOMY      Family History  Problem Relation Age of Onset  . Heart disease Maternal Grandfather   . Emphysema Maternal Grandfather   . Asthma Maternal Grandfather   . Heart disease Paternal Grandfather   . Heart disease Father        multpile bypass  . Emphysema Paternal Grandmother   . Diabetes Paternal Grandmother   . Allergies Sister   . Diabetes Sister   . Asthma Sister   . Eczema Sister   . Allergies Son   . Allergies Mother     . Allergies Other        mothers side  . Asthma Other        father side  . Asthma Maternal Aunt   . Diabetes Paternal Aunt   . Diabetes Paternal Uncle   . Breast cancer Neg Hx     Allergies  Allergen Reactions  . Other Swelling    Bees/Wasps- redness    Current Outpatient Medications on File Prior to Visit  Medication Sig Dispense Refill  . albuterol (PROAIR HFA) 108 (90 Base) MCG/ACT inhaler Inhale into the lungs every 6 (six) hours as needed for wheezing or shortness of breath.    . chlorpheniramine (CHLOR-TRIMETON) 4 MG tablet Take 8 mg by mouth at bedtime.    Marland Kitchen EPINEPHrine 0.3 mg/0.3 mL IJ SOAJ injection Inject 0.3 mLs (0.3 mg total) into the muscle as needed for anaphylaxis. 1 each 0  . famotidine (PEPCID) 20 MG tablet One at bedtime 30 tablet 11  . fluorometholone (FML) 0.1 % ophthalmic suspension SMARTSIG:2 Drop(s) In Eye(s) 4 Times Daily PRN    . fluticasone furoate-vilanterol (BREO ELLIPTA) 200-25 MCG/INH AEPB Inhale 1 puff into the lungs daily.    Marland Kitchen PARoxetine (PAXIL) 10 MG tablet Take 1 tablet (10 mg total) by mouth daily. 90 tablet 3  . SUMAtriptan (IMITREX) 50 MG tablet TAKE 1 TABLET MY MOUTH AT MIGRAINE ONSET. MAY REPEAT IN 2 HOURS IF HEADACHE PERSISTS OR RECURS. 10 tablet 5  . triamcinolone (NASACORT AQ) 55 MCG/ACT nasal inhaler Place 2 sprays into the nose daily. 1 Inhaler 0   No current facility-administered medications on file prior to visit.    BP 126/82   Pulse 86   Temp 97.6 F (36.4 C) (Temporal)   Ht 5\' 5"  (1.651 m)   Wt 229 lb 8 oz (104.1 kg)   SpO2 98%   BMI 38.19 kg/m    Objective:   Physical Exam Cardiovascular:     Rate and Rhythm: Normal rate and regular rhythm.     Comments: No lower extremity edema noted on exam today. Pulmonary:     Effort: Pulmonary effort is normal.     Breath sounds: Normal breath sounds.  Musculoskeletal:     Cervical back: Neck supple.  Skin:    General: Skin is warm and dry.            Assessment &  Plan:

## 2020-01-26 LAB — LIPID PANEL
Cholesterol: 213 mg/dL — ABNORMAL HIGH (ref 0–200)
HDL: 36.8 mg/dL — ABNORMAL LOW (ref >39.00)
LDL Cholesterol: 137 mg/dL — ABNORMAL HIGH (ref 0–99)
NonHDL: 176.22
Total CHOL/HDL Ratio: 6
Triglycerides: 194 mg/dL — ABNORMAL HIGH (ref 0.0–149.0)
VLDL: 38.8 mg/dL (ref 0.0–40.0)

## 2020-01-26 LAB — HEMOGLOBIN A1C: Hgb A1c MFr Bld: 5.7 % (ref 4.6–6.5)

## 2020-01-26 LAB — COMPREHENSIVE METABOLIC PANEL
ALT: 14 U/L (ref 0–35)
AST: 13 U/L (ref 0–37)
Albumin: 4 g/dL (ref 3.5–5.2)
Alkaline Phosphatase: 65 U/L (ref 39–117)
BUN: 9 mg/dL (ref 6–23)
CO2: 32 mEq/L (ref 19–32)
Calcium: 9.4 mg/dL (ref 8.4–10.5)
Chloride: 100 mEq/L (ref 96–112)
Creatinine, Ser: 0.78 mg/dL (ref 0.40–1.20)
GFR: 76.93 mL/min (ref >60.00)
Glucose, Bld: 93 mg/dL (ref 70–99)
Potassium: 4.5 mEq/L (ref 3.5–5.1)
Sodium: 139 mEq/L (ref 135–145)
Total Bilirubin: 0.4 mg/dL (ref 0.2–1.2)
Total Protein: 6.6 g/dL (ref 6.0–8.3)

## 2020-01-26 LAB — CBC
HCT: 40.7 % (ref 36.0–46.0)
Hemoglobin: 13.6 g/dL (ref 12.0–15.0)
MCHC: 33.4 g/dL (ref 30.0–36.0)
MCV: 91.7 fl (ref 78.0–100.0)
Platelets: 376 10*3/uL (ref 150.0–400.0)
RBC: 4.43 Mil/uL (ref 3.87–5.11)
RDW: 14.7 % (ref 11.5–15.5)
WBC: 12.7 10*3/uL — ABNORMAL HIGH (ref 4.0–10.5)

## 2020-01-26 LAB — FOLLICLE STIMULATING HORMONE: FSH: 14.2 m[IU]/mL

## 2020-01-26 LAB — LUTEINIZING HORMONE: LH: 6.1 m[IU]/mL

## 2020-01-26 LAB — BRAIN NATRIURETIC PEPTIDE: Pro B Natriuretic peptide (BNP): 33 pg/mL (ref 0.0–100.0)

## 2020-02-15 ENCOUNTER — Other Ambulatory Visit: Payer: Self-pay | Admitting: Obstetrics and Gynecology

## 2020-02-15 DIAGNOSIS — F419 Anxiety disorder, unspecified: Secondary | ICD-10-CM

## 2020-02-17 MED ORDER — PAROXETINE HCL 10 MG PO TABS: 10.0000 mg | ORAL_TABLET | Freq: Every day | ORAL | 1 refills | Status: DC

## 2020-02-17 NOTE — Telephone Encounter (Signed)
Have not prescribed Last OV (acute) with Mayra Reel on 01/25/2020 No future OV scheduled

## 2020-09-08 ENCOUNTER — Other Ambulatory Visit: Payer: Self-pay | Admitting: Primary Care

## 2020-09-08 DIAGNOSIS — F419 Anxiety disorder, unspecified: Secondary | ICD-10-CM

## 2020-12-12 ENCOUNTER — Other Ambulatory Visit: Payer: Self-pay | Admitting: Primary Care

## 2020-12-12 DIAGNOSIS — F419 Anxiety disorder, unspecified: Secondary | ICD-10-CM

## 2021-05-31 ENCOUNTER — Telehealth: Payer: Self-pay | Admitting: Primary Care

## 2021-05-31 NOTE — Telephone Encounter (Signed)
Dustin CMA teams me back that pt has appt with Allayne Gitelman NP on 06/01/21 at 8:40.

## 2021-05-31 NOTE — Telephone Encounter (Signed)
[facility] Day - Client TELEPHONE ADVICE RECORD AccessNurse Patient Name: Amanda Dixon Gender: Female DOB: 12/23/1965 Age: 55 Y 5 M 22 D Return Phone Number: [phone] (Primary) Address: City/ State/ Zip: [place] [address] Client [facility] Day - Client Client Site [facility] - Day Physician Vernona Rieger - NP Contact Type Call Who Is Calling Patient / Member / Family / Caregiver Call Type Triage / Clinical Relationship To Patient Self Return Phone Number [phone] (Primary) Chief Complaint Headache Reason for Call Symptomatic / Request for Health Information Initial Comment Caller states she is having headaches and her blood pressure is 145/99 Translation No Nurse Assessment Nurse: Risa Grill, RN, Lambert Mody Date/Time (Eastern Time): 05/31/2021 1:46:34 PM Confirm and document reason for call. If symptomatic, describe symptoms. ---Caller states severe headache and her blood pressure is 145/99. no bp meds. took excedrin Does the patient have any new or worsening symptoms? ---Yes Will a triage be completed? ---Yes Related visit to physician within the last 2 weeks? ---No Does the PT have any chronic conditions? (i.e. diabetes, asthma, this includes High risk factors for pregnancy, etc.) ---No Is this a behavioral health or substance abuse call? ---No Guidelines Guideline Title Affirmed Question Affirmed Notes Nurse Date/Time (Eastern Time) Headache [1] SEVERE headache (e.g., excruciating) AND [2] not improved after 2 hours of pain medicine Risa Grill, RN, Tracie 05/31/2021 1:48:09 PM Disp. Time Lamount Cohen Time) Disposition Final User 05/31/2021 1:51:03 PM See HCP within 4 Hours (or PCP triage) Yes Risa Grill, RN, Lambert Mody PLEASE NOTE: All timestamps contained within this report are represented as [place] Standard Time. CONFIDENTIALTY NOTICE: This fax transmission is intended only for the  addressee. It contains information that is legally privileged, confidential or otherwise protected from use or disclosure. If you are not the intended recipient, you are strictly prohibited from reviewing, disclosing, copying using or disseminating any of this information or taking any action in reliance on or regarding this information. If you have received this fax in error, please notify us immediately by telephone so that we can arrange for its return to [place]. Phone: [phone], Toll-Free: [phone], Fax: [phone] Page: 2 of 2 Call Id: [id number] Caller Disagree/Comply Comply Caller Understands Yes PreDisposition Call Doctor Care Advice Given Per Guideline SEE HCP (OR PCP TRIAGE) WITHIN 4 HOURS: * IF OFFICE WILL BE OPEN: You need to be seen within the next 3 or 4 hours. Call your doctor (or NP/PA) now or as soon as the office opens. PAIN MEDICINES: * For pain relief, you can take either acetaminophen, ibuprofen, or naproxen. ANOTHER ADULT SHOULD DRIVE: * It is better and safer if another adult drives instead of you. CALL BACK IF: * You become worse CARE ADVICE given per Headache (Adult) guideline. Comments User: Theresa Mulligan, RN Date/Time Lamount Cohen Time): 05/31/2021 1:56:03 PM no appt avail today per backline Referrals GO TO FACILITY UNDECIDE

## 2021-05-31 NOTE — Telephone Encounter (Signed)
Patient started the headache Tuesday morning and its getting worse its not going away. This morning she started checking her BP this morning. Patient informed that she should take an I home Covid Test so that she can eliminate the possibility of it being Covid Positive. Will Call patient back in 15 minutes for results. Called the patient and it was negative. With the hypertension the patient would like to be seen. Patient is scheduled to see Mayra Reel.

## 2021-05-31 NOTE — Telephone Encounter (Signed)
Noted, will evaluate. 

## 2021-05-31 NOTE — Telephone Encounter (Signed)
Sending note to Allayne Gitelman NP and [facility] CMA. See note below the access nurse note; there is notation pt has appt with Allayne Gitelman NP on 06/06/21 at 7:20. Was this first available appt that fitted pts schedule; access nurse disposition was to be seen in 4 hrs. Please advise.(Available appts with Margreta Journey NP on 06/01/21;06/05/21.)

## 2021-05-31 NOTE — Telephone Encounter (Signed)
Pt called in complaining about headaches for a few day. Pt stated that she thought it was a sinus infection. Pt headache was worst yesterday then today. Pt stated that she took her BP and BP was 146/86. Pt stated that is not normal for her BP to be slightly high. Pt stated that she checked her BP 20 to 30 mins ago. Pt would like to be prescribe with something.

## 2021-06-01 ENCOUNTER — Encounter: Payer: Self-pay | Admitting: Primary Care

## 2021-06-01 ENCOUNTER — Ambulatory Visit (INDEPENDENT_AMBULATORY_CARE_PROVIDER_SITE_OTHER): Payer: 59 | Admitting: Primary Care

## 2021-06-01 ENCOUNTER — Other Ambulatory Visit: Payer: Self-pay

## 2021-06-01 DIAGNOSIS — R7303 Prediabetes: Secondary | ICD-10-CM

## 2021-06-01 DIAGNOSIS — E785 Hyperlipidemia, unspecified: Secondary | ICD-10-CM

## 2021-06-01 DIAGNOSIS — R030 Elevated blood-pressure reading, without diagnosis of hypertension: Secondary | ICD-10-CM | POA: Diagnosis not present

## 2021-06-01 DIAGNOSIS — F411 Generalized anxiety disorder: Secondary | ICD-10-CM

## 2021-06-01 LAB — CBC
HCT: 41.2 % (ref 36.0–46.0)
Hemoglobin: 13.8 g/dL (ref 12.0–15.0)
MCHC: 33.5 g/dL (ref 30.0–36.0)
MCV: 90.9 fl (ref 78.0–100.0)
Platelets: 324 10*3/uL (ref 150.0–400.0)
RBC: 4.53 Mil/uL (ref 3.87–5.11)
RDW: 14.2 % (ref 11.5–15.5)
WBC: 8.4 10*3/uL (ref 4.0–10.5)

## 2021-06-01 LAB — LIPID PANEL
Cholesterol: 262 mg/dL — ABNORMAL HIGH (ref 0–200)
HDL: 47.8 mg/dL (ref >39.00)
LDL Cholesterol: 184 mg/dL — ABNORMAL HIGH (ref 0–99)
NonHDL: 214.24
Total CHOL/HDL Ratio: 5
Triglycerides: 151 mg/dL — ABNORMAL HIGH (ref 0.0–149.0)
VLDL: 30.2 mg/dL (ref 0.0–40.0)

## 2021-06-01 LAB — COMPREHENSIVE METABOLIC PANEL
ALT: 15 U/L (ref 0–35)
AST: 17 U/L (ref 0–37)
Albumin: 4.1 g/dL (ref 3.5–5.2)
Alkaline Phosphatase: 65 U/L (ref 39–117)
BUN: 13 mg/dL (ref 6–23)
CO2: 33 mEq/L — ABNORMAL HIGH (ref 19–32)
Calcium: 9.3 mg/dL (ref 8.4–10.5)
Chloride: 102 mEq/L (ref 96–112)
Creatinine, Ser: 0.71 mg/dL (ref 0.40–1.20)
GFR: 95.68 mL/min (ref >60.00)
Glucose, Bld: 93 mg/dL (ref 70–99)
Potassium: 4.6 mEq/L (ref 3.5–5.1)
Sodium: 141 mEq/L (ref 135–145)
Total Bilirubin: 0.5 mg/dL (ref 0.2–1.2)
Total Protein: 7 g/dL (ref 6.0–8.3)

## 2021-06-01 LAB — HEMOGLOBIN A1C: Hgb A1c MFr Bld: 5.7 % (ref 4.6–6.5)

## 2021-06-01 LAB — TSH: TSH: 2.95 u[IU]/mL (ref 0.35–5.50)

## 2021-06-01 MED ORDER — PAROXETINE HCL 20 MG PO TABS: 20.0000 mg | ORAL_TABLET | Freq: Every day | ORAL | 0 refills | Status: DC

## 2021-06-01 NOTE — Assessment & Plan Note (Signed)
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 Normal BP reading today in the office, also very consistent with prior office visit readings.  Suspect stress/anxiety to be contributing to symptoms. Will check labs today to rule out other causes.   Will treat anxiety.  We will see her back in 6 weeks for follow up.

## 2021-06-01 NOTE — Patient Instructions (Signed)
We increased your dose of paroxetine to 20 mg for anxiety.  Stop by the lab prior to leaving today. I will notify you of your results once received.   Continue walking. Work on taking time out for yourself.  See you in a few weeks!

## 2021-06-01 NOTE — Assessment & Plan Note (Signed)
Uncontrolled, likely contributing to headaches, fatigue, elevated BP readings.   Increase dose of paroxetine to 20 mg.  She kindly declines therapy.  We will plan to see her back in 6 weeks for follow up.

## 2021-06-01 NOTE — Progress Notes (Signed)
Subjective:    Patient ID: Amanda Dixon, female    DOB: Sep 14, 1965, 55 y.o.   MRN: [id number]  HPI  Amanda Dixon is a very pleasant 55 y.o. female with a history of anxiety, asthma, chronic migraines, obesity, seasonal allergies who presents today to discuss blood pressure readings. She has not been seen since June 2021.  No prior diagnosis of hypertension and is not on treatment. Over the last year she's felt poorly with symptoms of headaches,   She's under a lot of personal stress, constantly worried, is constantly tense, not sleeping well, feels fatigued. She is managed on paroxetine 10 mg for chronic anxiety which has helped overall, she still believes she receives some benefit. Her family has made mention of her anxiety. She's tried therapy several times, didn't feel as though it was effective.    PHQ9 SCORE ONLY 06/01/2021  PHQ-9 Total Score 13   GAD 7 : Generalized Anxiety Score 06/01/2021  Nervous, Anxious, on Edge 3  Control/stop worrying 3  Worry too much - different things 3  Trouble relaxing 3  Restless 3  Easily annoyed or irritable 1  Afraid - awful might happen 0  Total GAD 7 Score 16  Anxiety Difficulty Not difficult at all      She's been tracking her BP at home and is getting readings of 140's-150's/90's for which she's noticed intermittently for the last 2-3 months. 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   She's had a left temporal headache over the last week, little improvement with OTC treatment. Improved today. She's not been able to take sumatriptan at work as it causes drowsiness.   She's been walking for the last few months in an effort to work on weight loss.   BP Readings from Last 3 Encounters:  06/01/21 124/72  01/25/20 126/82  09/20/19 122/86    Wt Readings from Last 3 Encounters:  06/01/21 225 lb (102.1 kg)  01/25/20 229 lb 8 oz (104.1 kg)  09/20/19 234 lb 4 oz (106.3 kg)      Review of  Systems  Eyes:  Negative for visual disturbance.  Respiratory:  Negative for shortness of breath.   Cardiovascular:  Negative for chest pain.  Neurological:  Positive for headaches.  Psychiatric/Behavioral:  The patient is nervous/anxious.         Past Medical History:  Diagnosis Date   Asthma    Sinus trouble     Social History   Socioeconomic History   Marital status: Married    Spouse name: Not on file   Number of children: Not on file   Years of education: Not on file   Highest education level: Not on file  Occupational History   Occupation: Research scientist (medical)  Tobacco Use   Smoking status: Never   Smokeless tobacco: Never   Tobacco comments:    NEVER SMOKER:  EXPOSED TO 2ND(HUSBAND) X SEVERAL YEARS.   Vaping Use   Vaping Use: Never used  Substance and Sexual Activity   Alcohol use: No   Drug use: No   Sexual activity: Yes    Partners: Male    Birth control/protection: Surgical    Comment: Hysterectomy   Other Topics Concern   Not on file  Social History Narrative   Not on file   Social Determinants of Health   Financial Resource Strain: Not on file  Food Insecurity: Not on file  Transportation Needs: Not on file  Physical Activity: Not on file  Stress: Not on file  Social Connections: Not on file  Intimate Partner Violence: Not on file    Past Surgical History:  Procedure Laterality Date   ABDOMINAL HYSTERECTOMY  2008   supracervical   CESAREAN SECTION     CHOLECYSTECTOMY  1996/97?   TONSILLECTOMY      Family History  Problem Relation Age of Onset   Heart disease Maternal Grandfather    Emphysema Maternal Grandfather    Asthma Maternal Grandfather    Heart disease Paternal Grandfather    Heart disease Father        multpile bypass   Emphysema Paternal Grandmother    Diabetes Paternal Grandmother    Allergies Sister    Diabetes Sister    Asthma Sister    Eczema Sister    Allergies Son    Allergies Mother    Allergies Other         mothers side   Asthma Other        father side   Asthma Maternal Aunt    Diabetes Paternal Aunt    Diabetes Paternal Uncle    Breast cancer Neg Hx     Allergies  Allergen Reactions   Other Swelling    Bees/Wasps- redness    Current Outpatient Medications on File Prior to Visit  Medication Sig Dispense Refill   albuterol (VENTOLIN HFA) 108 (90 Base) MCG/ACT inhaler Inhale into the lungs every 6 (six) hours as needed for wheezing or shortness of breath.     chlorpheniramine (CHLOR-TRIMETON) 4 MG tablet Take 8 mg by mouth at bedtime.     famotidine (PEPCID) 20 MG tablet One at bedtime 30 tablet 11   fluticasone furoate-vilanterol (BREO ELLIPTA) 200-25 MCG/INH AEPB Inhale 1 puff into the lungs daily.     PARoxetine (PAXIL) 10 MG tablet TAKE 1 TABLET (10 MG TOTAL) BY MOUTH DAILY. FOR ANXIETY. 90 tablet 2   SUMAtriptan (IMITREX) 50 MG tablet TAKE 1 TABLET MY MOUTH AT MIGRAINE ONSET. MAY REPEAT IN 2 HOURS IF HEADACHE PERSISTS OR RECURS. 10 tablet 5   triamcinolone (NASACORT AQ) 55 MCG/ACT nasal inhaler Place 2 sprays into the nose daily. 1 Inhaler 0   EPINEPHrine 0.3 mg/0.3 mL IJ SOAJ injection Inject 0.3 mLs (0.3 mg total) into the muscle as needed for anaphylaxis. (Patient not taking: Reported on 06/01/2021) 1 each 0   terbinafine (LAMISIL) 250 MG tablet Take 250 mg by mouth daily.     No current facility-administered medications on file prior to visit.    BP 124/72   Pulse 75   Temp (!) 97.5 F (36.4 C) (Temporal)   Ht 5\' 5"  (1.651 m)   Wt 225 lb (102.1 kg)   SpO2 98%   BMI 37.44 kg/m  Objective:   Physical Exam Cardiovascular:     Rate and Rhythm: Normal rate and regular rhythm.  Pulmonary:     Effort: Pulmonary effort is normal.     Breath sounds: Normal breath sounds.  Musculoskeletal:     Cervical back: Neck supple.  Skin:    General: Skin is warm and dry.  Psychiatric:        Mood and Affect: Mood normal.     Comments: Speaking very quickly, appears anxious.            Assessment & Plan:      This visit occurred during the SARS-CoV-2 public health emergency.  Safety protocols were in place, including screening questions prior to the  visit, additional usage of staff PPE, and extensive cleaning of exam room while observing appropriate contact time as indicated for disinfecting solutions.

## 2021-06-05 MED ORDER — ROSUVASTATIN CALCIUM 10 MG PO TABS: 10.0000 mg | ORAL_TABLET | Freq: Every day | ORAL | 3 refills | Status: DC

## 2021-06-06 ENCOUNTER — Ambulatory Visit: Payer: 59 | Admitting: Primary Care

## 2021-07-09 ENCOUNTER — Other Ambulatory Visit: Payer: Self-pay

## 2021-07-10 ENCOUNTER — Other Ambulatory Visit: Payer: Self-pay

## 2021-07-10 ENCOUNTER — Ambulatory Visit (INDEPENDENT_AMBULATORY_CARE_PROVIDER_SITE_OTHER): Payer: 59 | Admitting: Primary Care

## 2021-07-10 ENCOUNTER — Encounter: Payer: Self-pay | Admitting: Primary Care

## 2021-07-10 VITALS — BP 122/74 | HR 75 | Temp 97.1°F | Ht 65.0 in | Wt 226.0 lb

## 2021-07-10 DIAGNOSIS — Z0000 Encounter for general adult medical examination without abnormal findings: Secondary | ICD-10-CM | POA: Diagnosis not present

## 2021-07-10 DIAGNOSIS — B356 Tinea cruris: Secondary | ICD-10-CM | POA: Diagnosis not present

## 2021-07-10 DIAGNOSIS — Z114 Encounter for screening for human immunodeficiency virus [HIV]: Secondary | ICD-10-CM

## 2021-07-10 DIAGNOSIS — J302 Other seasonal allergic rhinitis: Secondary | ICD-10-CM

## 2021-07-10 DIAGNOSIS — G43709 Chronic migraine without aura, not intractable, without status migrainosus: Secondary | ICD-10-CM

## 2021-07-10 DIAGNOSIS — R030 Elevated blood-pressure reading, without diagnosis of hypertension: Secondary | ICD-10-CM

## 2021-07-10 DIAGNOSIS — J45990 Exercise induced bronchospasm: Secondary | ICD-10-CM | POA: Diagnosis not present

## 2021-07-10 DIAGNOSIS — Z1159 Encounter for screening for other viral diseases: Secondary | ICD-10-CM

## 2021-07-10 DIAGNOSIS — Z0001 Encounter for general adult medical examination with abnormal findings: Secondary | ICD-10-CM | POA: Insufficient documentation

## 2021-07-10 DIAGNOSIS — E785 Hyperlipidemia, unspecified: Secondary | ICD-10-CM | POA: Insufficient documentation

## 2021-07-10 DIAGNOSIS — Z1231 Encounter for screening mammogram for malignant neoplasm of breast: Secondary | ICD-10-CM

## 2021-07-10 DIAGNOSIS — F411 Generalized anxiety disorder: Secondary | ICD-10-CM

## 2021-07-10 LAB — LIPID PANEL
Cholesterol: 165 mg/dL (ref 0–200)
HDL: 48.7 mg/dL (ref >39.00)
LDL Cholesterol: 97 mg/dL (ref 0–99)
NonHDL: 116.7
Total CHOL/HDL Ratio: 3
Triglycerides: 97 mg/dL (ref 0.0–149.0)
VLDL: 19.4 mg/dL (ref 0.0–40.0)

## 2021-07-10 MED ORDER — ALBUTEROL SULFATE HFA 108 (90 BASE) MCG/ACT IN AERS: 2.0000 | INHALATION_SPRAY | Freq: Four times a day (QID) | RESPIRATORY_TRACT | 0 refills | Status: DC | PRN

## 2021-07-10 MED ORDER — FLUTICASONE FUROATE-VILANTEROL 200-25 MCG/ACT IN AEPB: 1.0000 | INHALATION_SPRAY | Freq: Every day | RESPIRATORY_TRACT | 3 refills | Status: DC

## 2021-07-10 NOTE — Assessment & Plan Note (Signed)
Overall doing well, continue PRN sumatriptan 50 mg. Infrequent use overall.

## 2021-07-10 NOTE — Assessment & Plan Note (Signed)
Well controlled today. Continue to monitor.  Not on treatment.

## 2021-07-10 NOTE — Assessment & Plan Note (Signed)
Resolved, completed Lamisil 250 mg per podiatry.

## 2021-07-10 NOTE — Assessment & Plan Note (Signed)
Declines influenza and Shingrix vaccines. Pap smear UTD. Mammogram due, orders placed. Colonoscopy UTD, unclear when due, will try to obtain records.  Discussed the importance of a healthy diet and regular exercise in order for weight loss, and to reduce the risk of further co-morbidity.  Exam today stable Labs pending.

## 2021-07-10 NOTE — Patient Instructions (Addendum)
Stop by the lab prior to leaving today. I will notify you of your results once received.   [facility] to schedule your [facility].   Call the [facility] for the nutritionist referral.  It was a pleasure to see you today!  Preventive Care 55-55 Years Old, Female Preventive care refers to lifestyle choices and visits with your health care provider that can promote health and wellness. Preventive care visits are also called wellness exams. What can I expect for my preventive care visit? Counseling Your health care provider may ask you questions about your: Medical history, including: Past medical problems. Family medical history. Pregnancy history. Current health, including: Menstrual cycle. Method of birth control. Emotional well-being. Home life and relationship well-being. Sexual activity and sexual health. Lifestyle, including: Alcohol, nicotine or tobacco, and drug use. Access to firearms. Diet, exercise, and sleep habits. Work and work Statistician. Sunscreen use. Safety issues such as seatbelt and bike helmet use. Physical exam Your health care provider will check your: Height and weight. These may be used to calculate your BMI (body mass index). BMI is a measurement that tells if you are at a healthy weight. Waist circumference. This measures the distance around your waistline. This measurement also tells if you are at a healthy weight and may help predict your risk of certain diseases, such as type 2 diabetes and high blood pressure. Heart rate and blood pressure. Body temperature. Skin for abnormal spots. What immunizations do I need? Vaccines are usually given at various ages, according to a schedule. Your health care provider will recommend vaccines for you based on your age, medical history, and lifestyle or other factors, such as travel or where you work. What tests do I need? Screening Your health care provider may recommend screening tests  for certain conditions. This may include: Lipid and cholesterol levels. Diabetes screening. This is done by checking your blood sugar (glucose) after you have not eaten for a while (fasting). Pelvic exam and Pap test. Hepatitis B test. Hepatitis C test. HIV (human immunodeficiency virus) test. STI (sexually transmitted infection) testing, if you are at risk. Lung cancer screening. Colorectal cancer screening. Mammogram. Talk with your health care provider about when you should start having regular mammograms. This may depend on whether you have a family history of breast cancer. BRCA-related cancer screening. This may be done if you have a family history of breast, ovarian, tubal, or peritoneal cancers. Bone density scan. This is done to screen for osteoporosis. Talk with your health care provider about your test results, treatment options, and if necessary, the need for more tests. Follow these instructions at home: Eating and drinking  Eat a diet that includes fresh fruits and vegetables, whole grains, lean protein, and low-fat dairy products. Take vitamin and mineral supplements as recommended by your health care provider. Do not drink alcohol if: Your health care provider tells you not to drink. You are pregnant, may be pregnant, or are planning to become pregnant. If you drink alcohol: Limit how much you have to 0-1 drink a day. Know how much alcohol is in your drink. In the [place] one drink equals one 12 oz bottle of beer (355 mL), one 5 oz glass of wine (148 mL), or one 1 oz glass of hard liquor (44 mL). Lifestyle Brush your teeth every morning and night with fluoride toothpaste. Floss one time each day. Exercise for at least 30 minutes 5 or more days each week. Do not use any products that  contain nicotine or tobacco. These products include cigarettes, chewing tobacco, and vaping devices, such as e-cigarettes. If you need help quitting, ask your health care provider. Do not use  drugs. If you are sexually active, practice safe sex. Use a condom or other form of protection to prevent STIs. If you do not wish to become pregnant, use a form of birth control. If you plan to become pregnant, see your health care provider for a prepregnancy visit. Take aspirin only as told by your health care provider. Make sure that you understand how much to take and what form to take. Work with your health care provider to find out whether it is safe and beneficial for you to take aspirin daily. Find healthy ways to manage stress, such as: Meditation, yoga, or listening to music. Journaling. Talking to a trusted person. Spending time with friends and family. Minimize exposure to UV radiation to reduce your risk of skin cancer. Safety Always wear your seat belt while driving or riding in a vehicle. Do not drive: If you have been drinking alcohol. Do not ride with someone who has been drinking. When you are tired or distracted. While texting. If you have been using any mind-altering substances or drugs. Wear a helmet and other protective equipment during sports activities. If you have firearms in your house, make sure you follow all gun safety procedures. Seek help if you have been physically or sexually abused. What's next? Visit your health care provider once a year for an annual wellness visit. Ask your health care provider how often you should have your eyes and teeth checked. Stay up to date on all vaccines. This information is not intended to replace advice given to you by your health care provider. Make sure you discuss any questions you have with your health care provider. Document Revised: 01/10/2021 Document Reviewed: 01/10/2021 Elsevier Patient Education  [place].

## 2021-07-10 NOTE — Assessment & Plan Note (Signed)
Doing well on Nasacort 55 mcg PRN and asthma treatment.  Continue same.

## 2021-07-10 NOTE — Assessment & Plan Note (Signed)
Overall stable.  Continue Breo 200-25 mcg daily. Continue PRN use of albuterol for which she uses sparingly.  Lung clear today. Refills provided.

## 2021-07-10 NOTE — Progress Notes (Signed)
Subjective:    Patient ID: Amanda Dixon, female    DOB: 28-Jan-1966, 55 y.o.   MRN: [id number]  HPI  Amanda Dixon is a very pleasant 55 y.o. female who presents today for complete physical and follow up of chronic conditions.  Immunizations: -Tetanus: 2020 -Influenza: Declines -Covid-19: Completed 1 J&J vaccine -Shingles: Declines   Diet: Fair diet.  Exercise: No regular exercise, some walking.   Eye exam: Completes annually  Dental exam: Completes semi-annually   Pap Smear: Completed in 2020 per GYN Mammogram: Completed in January 2021 Colonoscopy: Completed around the age of 11 per patient. We do not have those records.   BP Readings from Last 3 Encounters:  07/10/21 122/74  06/01/21 124/72  01/25/20 126/82        Review of Systems  Constitutional:  Negative for unexpected weight change.  HENT:  Negative for rhinorrhea.   Respiratory:  Negative for cough and shortness of breath.   Cardiovascular:  Negative for chest pain.  Gastrointestinal:  Negative for constipation and diarrhea.  Genitourinary:  Negative for difficulty urinating.  Musculoskeletal:  Positive for arthralgias.  Skin:  Negative for rash.  Allergic/Immunologic: Negative for environmental allergies.  Neurological:  Negative for dizziness, numbness and headaches.  Psychiatric/Behavioral:  The patient is nervous/anxious.        Anxiety improved on increased dose of paroxetine         Past Medical History:  Diagnosis Date   Asthma    Sinus trouble     Social History   Socioeconomic History   Marital status: Married    Spouse name: Not on file   Number of children: Not on file   Years of education: Not on file   Highest education level: Not on file  Occupational History   Occupation: Research scientist (medical)  Tobacco Use   Smoking status: Never   Smokeless tobacco: Never   Tobacco comments:    NEVER SMOKER:  EXPOSED TO 2ND(HUSBAND) X SEVERAL YEARS.   Vaping Use   Vaping Use: Never  used  Substance and Sexual Activity   Alcohol use: No   Drug use: No   Sexual activity: Yes    Partners: Male    Birth control/protection: Surgical    Comment: Hysterectomy   Other Topics Concern   Not on file  Social History Narrative   Not on file   Social Determinants of Health   Financial Resource Strain: Not on file  Food Insecurity: Not on file  Transportation Needs: Not on file  Physical Activity: Not on file  Stress: Not on file  Social Connections: Not on file  Intimate Partner Violence: Not on file    Past Surgical History:  Procedure Laterality Date   ABDOMINAL HYSTERECTOMY  2008   supracervical   CESAREAN SECTION     CHOLECYSTECTOMY  1996/97?   TONSILLECTOMY      Family History  Problem Relation Age of Onset   Heart disease Maternal Grandfather    Emphysema Maternal Grandfather    Asthma Maternal Grandfather    Heart disease Paternal Grandfather    Heart disease Father        multpile bypass   Emphysema Paternal Grandmother    Diabetes Paternal Grandmother    Allergies Sister    Diabetes Sister    Asthma Sister    Eczema Sister    Allergies Son    Allergies Mother    Allergies Other        mothers side  Asthma Other        father side   Asthma Maternal Aunt    Diabetes Paternal Aunt    Diabetes Paternal Uncle    Breast cancer Neg Hx     Allergies  Allergen Reactions   Other Swelling    Bees/Wasps- redness    Current Outpatient Medications on File Prior to Visit  Medication Sig Dispense Refill   famotidine (PEPCID) 20 MG tablet One at bedtime 30 tablet 11   meloxicam (MOBIC) 7.5 MG tablet Take 7.5 mg by mouth 2 (two) times daily.     methocarbamol (ROBAXIN) 500 MG tablet Take 500 mg by mouth 3 (three) times daily.     PARoxetine (PAXIL) 20 MG tablet Take 1 tablet (20 mg total) by mouth daily. For anxiety. 90 tablet 0   rosuvastatin (CRESTOR) 10 MG tablet Take 1 tablet (10 mg total) by mouth daily. For cholesterol. 90 tablet 3    SUMAtriptan (IMITREX) 50 MG tablet TAKE 1 TABLET MY MOUTH AT MIGRAINE ONSET. MAY REPEAT IN 2 HOURS IF HEADACHE PERSISTS OR RECURS. 10 tablet 5   triamcinolone (NASACORT AQ) 55 MCG/ACT nasal inhaler Place 2 sprays into the nose daily. 1 Inhaler 0   chlorpheniramine (CHLOR-TRIMETON) 4 MG tablet Take 8 mg by mouth at bedtime. (Patient not taking: Reported on 07/10/2021)     EPINEPHrine 0.3 mg/0.3 mL IJ SOAJ injection Inject 0.3 mLs (0.3 mg total) into the muscle as needed for anaphylaxis. (Patient not taking: Reported on 07/10/2021) 1 each 0   No current facility-administered medications on file prior to visit.    BP 122/74   Pulse 75   Temp (!) 97.1 F (36.2 C) (Temporal)   Ht 5\' 5"  (1.651 m)   Wt 226 lb (102.5 kg)   SpO2 97%   BMI 37.61 kg/m  Objective:   Physical Exam HENT:     Right Ear: Tympanic membrane and ear canal normal.     Left Ear: Tympanic membrane and ear canal normal.     Nose: Nose normal.  Eyes:     Conjunctiva/sclera: Conjunctivae normal.     Pupils: Pupils are equal, round, and reactive to light.  Neck:     Thyroid: No thyromegaly.  Cardiovascular:     Rate and Rhythm: Normal rate and regular rhythm.     Heart sounds: No murmur heard. Pulmonary:     Effort: Pulmonary effort is normal.     Breath sounds: Normal breath sounds. No rales.  Abdominal:     General: Bowel sounds are normal.     Palpations: Abdomen is soft.     Tenderness: There is no abdominal tenderness.  Musculoskeletal:     Cervical back: Neck supple.     Comments: Decrease in ROM with pain to hips and lower back  Lymphadenopathy:     Cervical: No cervical adenopathy.  Skin:    General: Skin is warm and dry.     Findings: No rash.  Neurological:     Mental Status: She is alert and oriented to person, place, and time.     Cranial Nerves: No cranial nerve deficit.     Deep Tendon Reflexes: Reflexes are normal and symmetric.  Psychiatric:        Mood and Affect: Mood normal.           Assessment & Plan:      This visit occurred during the SARS-CoV-2 public health emergency.  Safety protocols were in place, including screening questions prior to the  visit, additional usage of staff PPE, and extensive cleaning of exam room while observing appropriate contact time as indicated for disinfecting solutions.

## 2021-07-10 NOTE — Assessment & Plan Note (Signed)
Improved since increasing paroxetine to 20 mg, continue same.  Continue to monitor.

## 2021-07-10 NOTE — Assessment & Plan Note (Signed)
Compliant to rosuvastatin 10 mg, could be causing myalgias.  Repeat lipid panel pending.  Consider switching to atorvastatin vs pravastatin. Await results.

## 2021-07-11 LAB — HEPATITIS C ANTIBODY
Hepatitis C Ab: NONREACTIVE
SIGNAL TO CUT-OFF: 0.07 (ref <1.00)

## 2021-07-11 LAB — HIV ANTIBODY (ROUTINE TESTING W REFLEX): HIV 1&2 Ab, 4th Generation: NONREACTIVE

## 2021-07-20 NOTE — Telephone Encounter (Signed)
Amanda Dixon,  Can you take a look at her nutrition referral? She's tried calling too.  Thanks! Mayra Reel, NP-C

## 2021-07-25 ENCOUNTER — Other Ambulatory Visit: Payer: Self-pay

## 2021-07-25 ENCOUNTER — Ambulatory Visit
Admission: RE | Admit: 2021-07-25 | Discharge: 2021-07-25 | Disposition: A | Discharge status: Home / Self Care | Payer: 59 | Source: Ambulatory Visit | Attending: Primary Care | Admitting: Primary Care

## 2021-07-25 DIAGNOSIS — Z1231 Encounter for screening mammogram for malignant neoplasm of breast: Secondary | ICD-10-CM | POA: Diagnosis present

## 2021-08-06 ENCOUNTER — Telehealth: Payer: Self-pay | Admitting: Primary Care

## 2021-08-06 ENCOUNTER — Encounter: Payer: Self-pay | Admitting: Emergency Medicine

## 2021-08-06 ENCOUNTER — Emergency Department: Payer: 59

## 2021-08-06 ENCOUNTER — Ambulatory Visit
Admission: EM | Admit: 2021-08-06 | Discharge: 2021-08-06 | Disposition: A | Discharge status: Home / Self Care | Payer: 59

## 2021-08-06 ENCOUNTER — Other Ambulatory Visit: Payer: Self-pay

## 2021-08-06 DIAGNOSIS — I160 Hypertensive urgency: Secondary | ICD-10-CM

## 2021-08-06 DIAGNOSIS — R519 Headache, unspecified: Secondary | ICD-10-CM

## 2021-08-06 DIAGNOSIS — Z5321 Procedure and treatment not carried out due to patient leaving prior to being seen by health care provider: Secondary | ICD-10-CM | POA: Insufficient documentation

## 2021-08-06 DIAGNOSIS — I10 Essential (primary) hypertension: Secondary | ICD-10-CM | POA: Diagnosis not present

## 2021-08-06 DIAGNOSIS — R079 Chest pain, unspecified: Secondary | ICD-10-CM | POA: Diagnosis present

## 2021-08-06 LAB — CBC
HCT: 41.1 % (ref 36.0–46.0)
Hemoglobin: 13.6 g/dL (ref 12.0–15.0)
MCH: 30.8 pg (ref 26.0–34.0)
MCHC: 33.1 g/dL (ref 30.0–36.0)
MCV: 93 fL (ref 80.0–100.0)
Platelets: 321 10*3/uL (ref 150–400)
RBC: 4.42 MIL/uL (ref 3.87–5.11)
RDW: 13.8 % (ref 11.5–15.5)
WBC: 11.1 10*3/uL — ABNORMAL HIGH (ref 4.0–10.5)
nRBC: 0 % (ref 0.0–0.2)

## 2021-08-06 LAB — BASIC METABOLIC PANEL
Anion gap: 7 (ref 5–15)
BUN: 17 mg/dL (ref 6–20)
CO2: 29 mmol/L (ref 22–32)
Calcium: 9.1 mg/dL (ref 8.9–10.3)
Chloride: 102 mmol/L (ref 98–111)
Creatinine, Ser: 0.64 mg/dL (ref 0.44–1.00)
GFR, Estimated: >60 mL/min (ref >60)
Glucose, Bld: 99 mg/dL (ref 70–99)
Potassium: 3.8 mmol/L (ref 3.5–5.1)
Sodium: 138 mmol/L (ref 135–145)

## 2021-08-06 LAB — TROPONIN I (HIGH SENSITIVITY): Troponin I (High Sensitivity): 2 ng/L (ref <18)

## 2021-08-06 NOTE — ED Triage Notes (Addendum)
Hx of headaches when her blood pressure elevates. Checked her BP yesterday and was 194/110. BP in triage 131/81. Currently having a headache. Hx of migraines. States this doesn't feel like a migraine, and that her headache is currently located towards the back/base of the head.

## 2021-08-06 NOTE — Telephone Encounter (Signed)
Amanda Dixon called in and stated that she has been having High BP she took her BP yesterday morning and its was 190/116  178/112- last night , and she went to the Fire department and it kept bouncing and they told her that it could be more than 180 which was the high number.

## 2021-08-06 NOTE — Telephone Encounter (Signed)
Patient has an appointment scheduled with Dr.Bedsole tomorrow 08/07/21 at 11:00 am.  Will send to another provider today since PCP is not in the office today.

## 2021-08-06 NOTE — Discharge Instructions (Signed)
Please go to the emergency department as soon as you leave urgent care for signs of hypertensive urgency including elevated blood pressure, headache, frequent falls.

## 2021-08-06 NOTE — Telephone Encounter (Signed)
Noted and agree. 

## 2021-08-06 NOTE — Telephone Encounter (Signed)
Noted. Thanks.  Routed to PCP as FYI.  

## 2021-08-06 NOTE — ED Triage Notes (Signed)
Pt to ED for HTN/Headache that started yesterday Hx migraines. States does not feel like a migraine. +nausea.  Denies cp   Reports fall a week ago from tripping while stepping off camper, states thinks hit head.   Also reports "strange" feeling in left shoulde.r

## 2021-08-06 NOTE — Telephone Encounter (Signed)
PLEASE NOTE: All timestamps contained within this report are represented as [place] Standard Time. CONFIDENTIALTY NOTICE: This fax transmission is intended only for the addressee. It contains information that is legally privileged, confidential or otherwise protected from use or disclosure. If you are not the intended recipient, you are strictly prohibited from reviewing, disclosing, copying using or disseminating any of this information or taking any action in reliance on or regarding this information. If you have received this fax in error, please notify us immediately by telephone so that we can arrange for its return to [place]. Phone: [phone], Toll-Free: [phone], Fax: [phone] Page: 1 of 2 Call Id: [id number] [facility] Day - Client TELEPHONE ADVICE RECORD AccessNurse Patient Name: Amanda Dixon Gender: Female DOB: 10/24/1965 Age: 56 Y 7 M 28 D Return Phone Number: [phone] (Primary) Address: City/ State/ Zip: [place] [address] Client [facility] Day - Client Client Site  [facility] - Day Provider Vernona Rieger - NP Contact Type Call Who Is Calling Patient / Member / Family / Caregiver Call Type Triage / Clinical Relationship To Patient Self Return Phone Number [phone] (Primary) Chief Complaint Weakness, Localized Reason for Call Symptomatic / Request for Health Information Initial Comment Caller states she's high BP. She stopped by the fire dept, and it was bouncing. It was 175 plus. She has a headache and nausea. Weakness. Translation No Nurse Assessment Nurse: Mayford Knife, RN, Lupe Carney Date/Time (Eastern Time): 08/06/2021 9:58:29 AM Confirm and document reason for call. If symptomatic, describe symptoms. ---170-180 systolic BP per fire department Does the patient have any new or worsening symptoms? ---Yes Will a triage be completed? ---Yes Related visit to physician within the last  2 weeks? ---No Does the PT have any chronic conditions? (i.e. diabetes, asthma, this includes High risk factors for pregnancy, etc.) ---No Is this a behavioral health or substance abuse call? ---No Guidelines Guideline Title Affirmed Question Affirmed Notes Nurse Date/Time (Eastern Time) Blood Pressure - High Systolic BP >= 180 OR Diastolic >= 110 Turner, RN, Rebekah 08/06/2021 10:01:04 AM Disp. Time Lamount Cohen Time) Disposition Final User 08/06/2021 10:03:52 AM See PCP within 24 Hours Yes Turner, RN, Lupe Carney Caller Disagree/Comply Comply Caller Understands Yes PLEASE NOTE: All timestamps contained within this report are represented as [place] Standard Time. CONFIDENTIALTY NOTICE: This fax transmission is intended only for the addressee. It contains information that is legally privileged, confidential or otherwise protected from use or disclosure. If you are not the intended recipient, you are strictly prohibited from reviewing, disclosing, copying using or disseminating any of this information or taking any action in reliance on or regarding this information. If you have received this fax in error, please notify us immediately by telephone so that we can arrange for its return to [place]. Phone: [phone], Toll-Free: [phone], Fax: [phone] Page: 2 of 2 Call Id: [id number] PreDisposition Call Doctor Care Advice Given Per Guideline SEE PCP WITHIN 24 HOURS: * IF OFFICE WILL BE OPEN: You need to be examined within the next 24 hours. Call your doctor (or NP/PA) when the office opens and make an appointment. CALL BACK IF: * Weakness or numbness of the face, arm or leg on one side of the body occurs * Difficulty walking, difficulty talking, or severe headache occurs * Chest pain or difficulty breathing occurs * You become worse CARE ADVICE given per High Blood Pressure (Adult) guideline. Comments User: Donnelly Angelica Date/Time Lamount Cohen Time): 08/06/2021 9:51:51 AM Caller transferred from the  office. Referrals  REFERRED TO PCP OFFICE

## 2021-08-06 NOTE — Telephone Encounter (Signed)
Spoke to patient by telephone and was advised that she does have a bad headache but no other neuro symptoms. Patient stated that she has been checking her blood pressure at home and has been getting high readings. Patient stated that she got more concerned when she had her blood pressure checked at the Fire Department today. Patient stated that she is at work and will probably go ahead and go the an urgent care today. Patient was given information on the Urgent Care at [facility].  Patient stated that she really does not want to go to an ER  so will go to an UC.

## 2021-08-06 NOTE — ED Provider Notes (Signed)
[facility]    CSN: [id number] Arrival date & time: 08/06/21  1435      History   Chief Complaint Chief Complaint  Patient presents with   Hypertension   Headache    HPI Amanda Dixon is a 56 y.o. female.   Patient presents with elevated blood pressure and severe headache that started approximately 2 to 3 days ago.  Patient reports that she developed a headache that starts in the front of her head that extends around to the back of her neck that is rated 7/10 on pain scale and is described as sharp and throbbing.  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  Her home blood pressure readings have ranged from 170s to 190s systolic.  The fire department reported that they are not able to read the blood pressure and told her that this typically means that it is very elevated.  She had similar episode in November with a headache and elevated blood pressure that resolved on its own.  She does have a history of high blood pressure but does not take blood pressure medication.  She takes cholesterol medication.  She denies dizziness, blurred vision, vomiting, chest pain, shortness of breath does endorse some nausea at times.  She does have history of migraines but reports that this "feels different".  Patient also reports that she has had approximately 5 falls over the past few months with the last being 2 weeks ago.  She thinks that she hit her head.  She does not know what is causing the falls but reports that "she just falls down and is clumsy."    Hypertension  Headache  Past Medical History:  Diagnosis Date   Asthma    Sinus trouble     Patient Active Problem List   Diagnosis Date Noted   Preventative health care 07/10/2021   Hyperlipidemia 07/10/2021   Elevated blood pressure reading without diagnosis of hypertension 06/01/2021   Chronic migraine without aura without status migrainosus, not intractable 09/20/2019   Peripheral  edema 09/20/2019   Allergy to bee sting 01/25/2019   Tinea cruris 11/21/2017   Hypersomnia 12/19/2016   Mild exercise-induced asthma 10/07/2016   Seasonal allergies 10/07/2016   Obesity (BMI 35.0-39.9 without comorbidity) 10/07/2016   GAD (generalized anxiety disorder) 10/07/2016   Shortness of breath 02/22/2013   Chronic cough 02/22/2013    Past Surgical History:  Procedure Laterality Date   ABDOMINAL HYSTERECTOMY  2008   supracervical   CESAREAN SECTION     CHOLECYSTECTOMY  1996/97?   TONSILLECTOMY      OB History     Gravida  1   Para  1   Term  1   Preterm      AB      Living  1      SAB      IAB      Ectopic      Multiple      Live Births  1            Home Medications    Prior to Admission medications   Medication Sig Start Date End Date Taking? Authorizing Provider  albuterol (VENTOLIN HFA) 108 (90 Base) MCG/ACT inhaler Inhale 2 puffs into the lungs every 6 (six) hours as needed for wheezing or shortness of breath. 07/10/21   Doreene Nest, NP  chlorpheniramine (CHLOR-TRIMETON) 4 MG tablet Take 8 mg by mouth at bedtime. Patient not taking:  Reported on 07/10/2021    [provider]  EPINEPHrine 0.3 mg/0.3 mL IJ SOAJ injection Inject 0.3 mLs (0.3 mg total) into the muscle as needed for anaphylaxis. Patient not taking: Reported on 07/10/2021 01/25/19   Doreene Nest, NP  famotidine (PEPCID) 20 MG tablet One at bedtime 03/04/18   Nyoka Cowden, MD  fluticasone furoate-vilanterol (BREO ELLIPTA) 200-25 MCG/ACT AEPB Inhale 1 puff into the lungs daily. 07/10/21   Doreene Nest, NP  meloxicam (MOBIC) 7.5 MG tablet Take 7.5 mg by mouth 2 (two) times daily. 06/19/21   [provider]  methocarbamol (ROBAXIN) 500 MG tablet Take 500 mg by mouth 3 (three) times daily. 06/19/21   [provider]  PARoxetine (PAXIL) 20 MG tablet Take 1 tablet (20 mg total) by mouth daily. For anxiety. 06/01/21   Doreene Nest, NP   rosuvastatin (CRESTOR) 10 MG tablet Take 1 tablet (10 mg total) by mouth daily. For cholesterol. 06/05/21   Doreene Nest, NP  SUMAtriptan (IMITREX) 50 MG tablet TAKE 1 TABLET MY MOUTH AT MIGRAINE ONSET. MAY REPEAT IN 2 HOURS IF HEADACHE PERSISTS OR RECURS. 10/18/19   Doreene Nest, NP  triamcinolone (NASACORT AQ) 55 MCG/ACT nasal inhaler Place 2 sprays into the nose daily. 02/22/13   Lupita Leash, MD    Family History Family History  Problem Relation Age of Onset   Heart disease Maternal Grandfather    Emphysema Maternal Grandfather    Asthma Maternal Grandfather    Heart disease Paternal Grandfather    Heart disease Father        multpile bypass   Emphysema Paternal Grandmother    Diabetes Paternal Grandmother    Allergies Sister    Diabetes Sister    Asthma Sister    Eczema Sister    Allergies Son    Allergies Mother    Allergies Other        mothers side   Asthma Other        father side   Asthma Maternal Aunt    Diabetes Paternal Aunt    Diabetes Paternal Uncle    Breast cancer Neg Hx     Social History Social History   Tobacco Use   Smoking status: Never   Smokeless tobacco: Never   Tobacco comments:    NEVER SMOKER:  EXPOSED TO 2ND(HUSBAND) X SEVERAL YEARS.   Vaping Use   Vaping Use: Never used  Substance Use Topics   Alcohol use: No   Drug use: No     Allergies   Other   Review of Systems Review of Systems Per HPI  Physical Exam Triage Vital Signs ED Triage Vitals  Enc Vitals Group     BP 08/06/21 1621 (!) 128/49     Pulse Rate 08/06/21 1621 75     Resp 08/06/21 1621 16     Temp 08/06/21 1621 98.3 F (36.8 C)     Temp Source 08/06/21 1621 Oral     SpO2 08/06/21 1621 99 %     Weight --      Height --      Head Circumference --      Peak Flow --      Pain Score 08/06/21 1625 7     Pain Loc --      Pain Edu? --      Excl. in GC? --    No data found.  Updated Vital Signs BP (S) (!) 180/110 (BP Location: Left Arm)  Comment: Checked twice manually, same result. Notified provider   Pulse 75    Temp 98.3 F (36.8 C) (Oral)    Resp 16    SpO2 99%   Visual Acuity Right Eye Distance:   Left Eye Distance:   Bilateral Distance:    Right Eye Near:   Left Eye Near:    Bilateral Near:     Physical Exam Constitutional:      General: She is not in acute distress.    Appearance: Normal appearance. She is not toxic-appearing or diaphoretic.  HENT:     Head: Normocephalic and atraumatic.  Eyes:     Extraocular Movements: Extraocular movements intact.     Conjunctiva/sclera: Conjunctivae normal.     Pupils: Pupils are equal, round, and reactive to light.  Cardiovascular:     Rate and Rhythm: Normal rate and regular rhythm.     Pulses: Normal pulses.     Heart sounds: Normal heart sounds.  Pulmonary:     Effort: Pulmonary effort is normal. No respiratory distress.     Breath sounds: Normal breath sounds.  Neurological:     General: No focal deficit present.     Mental Status: She is alert and oriented to person, place, and time. Mental status is at baseline.     Cranial Nerves: Cranial nerves 2-12 are intact.     Sensory: Sensation is intact.     Motor: Motor function is intact.     Coordination: Coordination is intact.     Gait: Gait is intact.  Psychiatric:        Mood and Affect: Mood normal.        Behavior: Behavior normal.        Thought Content: Thought content normal.        Judgment: Judgment normal.     UC Treatments / Results  Labs (all labs ordered are listed, but only abnormal results are displayed) Labs Reviewed - No data to display  EKG   Radiology No results found.  Procedures Procedures (including critical care time)  Medications Ordered in UC Medications - No data to display  Initial Impression / Assessment and Plan / UC Course  I have reviewed the triage vital signs and the nursing notes.  Pertinent labs & imaging results that were available during my care of  the patient were reviewed by me and considered in my medical decision making (see chart for details).     Patient exhibiting signs of hypertensive urgency.  Automatic blood pressure cuff showing 130 systolics but manual blood pressure reading showing 180 systolic.  Patient was advised to go to the hospital for more extensive evaluation than can be provided at the urgent care as she has had frequent falls, headaches, elevated blood pressure readings.  Patient was agreeable with plan.  Neuro exam and patient's vitals fairly stable at discharge.  Agree with patient self transport to the hospital. Final Clinical Impressions(s) / UC Diagnoses   Final diagnoses:  Hypertensive urgency  Severe headache     Discharge Instructions      Please go to the emergency department as soon as you leave urgent care for signs of hypertensive urgency including elevated blood pressure, headache, frequent falls.    ED Prescriptions   None    PDMP not reviewed this encounter.   Gustavus BryantMound, Evonna Stoltz E, [place]NP 08/06/21 [id number]

## 2021-08-06 NOTE — Telephone Encounter (Signed)
If she had HA/vision changes/neuro sx, then eval today makes sense.

## 2021-08-06 NOTE — ED Provider Triage Note (Signed)
Emergency Medicine Provider Triage Evaluation Note  Amanda Dixon , a 56 y.o. female  was evaluated in triage.  Pt complains of headache, hypertension, strange feeling in left arm, and nausea. Symptoms started yesterday and have continued throughout the day. PCP sent her to the ER for further evaluation.  Review of Systems  Positive: Headache, left arm feels strange Negative: Chest pain  Physical Exam  BP (!) 159/106    Pulse 81    Temp 98.2 F (36.8 C) (Oral)    Resp 20    Ht 5\' 5"  (1.651 m)    SpO2 94%    BMI 37.61 kg/m  Gen:   Awake, no distress   Resp:  Normal effort  MSK:   Moves extremities without difficulty  Other:    Medical Decision Making  Medically screening exam initiated at 5:49 PM.  Appropriate orders placed.  Amanda Dixon was informed that the remainder of the evaluation will be completed by another provider, this initial triage assessment does not replace that evaluation, and the importance of remaining in the ED until their evaluation is complete.   [name], FNP 08/06/21 1750

## 2021-08-06 NOTE — Telephone Encounter (Signed)
I did get her triaged

## 2021-08-07 ENCOUNTER — Emergency Department
Admission: EM | Admit: 2021-08-07 | Discharge: 2021-08-07 | Discharge status: Against Medical Advice | Payer: 59 | Attending: Family Medicine | Admitting: Family Medicine

## 2021-08-07 ENCOUNTER — Ambulatory Visit (INDEPENDENT_AMBULATORY_CARE_PROVIDER_SITE_OTHER): Payer: 59 | Admitting: Family Medicine

## 2021-08-07 ENCOUNTER — Encounter: Payer: Self-pay | Admitting: Family Medicine

## 2021-08-07 VITALS — BP 120/104 | HR 84 | Temp 98.4°F | Ht 65.0 in | Wt 235.4 lb

## 2021-08-07 DIAGNOSIS — I10 Essential (primary) hypertension: Secondary | ICD-10-CM | POA: Diagnosis not present

## 2021-08-07 DIAGNOSIS — I872 Venous insufficiency (chronic) (peripheral): Secondary | ICD-10-CM | POA: Diagnosis not present

## 2021-08-07 LAB — TROPONIN I (HIGH SENSITIVITY): Troponin I (High Sensitivity): 3 ng/L (ref <18)

## 2021-08-07 MED ORDER — LOSARTAN POTASSIUM 25 MG PO TABS: 25.0000 mg | ORAL_TABLET | Freq: Every day | ORAL | 3 refills | Status: DC

## 2021-08-07 NOTE — Progress Notes (Signed)
Patient ID: Amanda Dixon, female    DOB: 07/21/1966, 56 y.o.   MRN: [id number]  This visit was conducted in person.  BP (!) 120/104    Pulse 84    Temp 98.4 F (36.9 C) (Temporal)    Ht  (1.651 m)    Wt 235 lb 6 oz (106.8 kg)    SpO2 96%    BMI 39.17 kg/m    CC:  Chief Complaint  Patient presents with   Hypertension    Seen at Urgent Care yesterday and then sent to ER.  After waiting 10 hours patient left.     Subjective:   HPI: Amanda Dixon is a 56 y.o. female  patient of Graylon Gunning with history of GAD, elevated BP without Dx of HTN, obesity,  and migraine presenting on 08/07/2021 for Hypertension (Seen at Urgent Care yesterday and then sent to ER.  After waiting 10 hours patient left. )  In NOV she had a headache for a few days ( non migraine).. check BP noted on arm cuff 190/116 Called here but could not get an appt. [facility] .. was high there as well. On follow up here 12/13.. BP was better 122/70.Marland Kitchen treated cholesterol.   She has intermittent headache since then. No better with Excedrin.  No chest pain, no  change in SOB, no leg swelling. No dizziness.  No new numbness, no weakness.  No oral eye exam I last month.  Seen in ED yesterday for hypertensive urgency BMET ( normal),CBC ( elevated wbc at 11), troponin x 2 normal EKG  unremarkable, no change from 2012 and CXR clear Left after 10 hours before being seen.  TSH checked in 05/2021 normal  BP Readings from Last 3 Encounters:  08/07/21 (!) 120/104  08/06/21 (!) 121/105  08/06/21 (S) (!) 180/110    No new meds.  Has had weight gain.  Under a lot of stress ( caregiver for family), work stress, family members have passed. Less exercsie given injury. No change in salt intake, caffeine, no ETOH.  Paxil increased from 10 to 20 mg. occ using meloxicam Wt Readings from Last 3 Encounters:  08/07/21 235 lb 6 oz (106.8 kg)  07/10/21 226 lb (102.5 kg)  06/01/21 225 lb (102.1 kg)    Strong  family history of HTN     Relevant past medical, surgical, family and social history reviewed and updated as indicated. Interim medical history since our last visit reviewed. Allergies and medications reviewed and updated. Outpatient Medications Prior to Visit  Medication Sig Dispense Refill   albuterol (VENTOLIN HFA) 108 (90 Base) MCG/ACT inhaler Inhale 2 puffs into the lungs every 6 (six) hours as needed for wheezing or shortness of breath. 18 g 0   famotidine (PEPCID) 20 MG tablet One at bedtime 30 tablet 11   fluticasone furoate-vilanterol (BREO ELLIPTA) 200-25 MCG/ACT AEPB Inhale 1 puff into the lungs daily. 3 each 3   meloxicam (MOBIC) 7.5 MG tablet Take 7.5 mg by mouth 2 (two) times daily.     PARoxetine (PAXIL) 20 MG tablet Take 1 tablet (20 mg total) by mouth daily. For anxiety. 90 tablet 0   rosuvastatin (CRESTOR) 10 MG tablet Take 1 tablet (10 mg total) by mouth daily. For cholesterol. 90 tablet 3   SUMAtriptan (IMITREX) 50 MG tablet TAKE 1 TABLET MY MOUTH AT MIGRAINE ONSET. MAY REPEAT IN 2 HOURS IF HEADACHE PERSISTS OR RECURS. 10 tablet 5   triamcinolone (NASACORT AQ)  55 MCG/ACT nasal inhaler Place 2 sprays into the nose daily. 1 Inhaler 0   chlorpheniramine (CHLOR-TRIMETON) 4 MG tablet Take 8 mg by mouth at bedtime. (Patient not taking: Reported on 07/10/2021)     EPINEPHrine 0.3 mg/0.3 mL IJ SOAJ injection Inject 0.3 mLs (0.3 mg total) into the muscle as needed for anaphylaxis. (Patient not taking: Reported on 07/10/2021) 1 each 0   methocarbamol (ROBAXIN) 500 MG tablet Take 500 mg by mouth 3 (three) times daily.     No facility-administered medications prior to visit.     Per HPI unless specifically indicated in ROS section below Review of Systems  Constitutional:  Negative for fatigue and fever.  HENT:  Negative for congestion.   Eyes:  Negative for pain.  Respiratory:  Negative for cough and shortness of breath.   Cardiovascular:  Negative for chest pain, palpitations and  leg swelling.  Gastrointestinal:  Negative for abdominal pain.  Genitourinary:  Negative for dysuria and vaginal bleeding.  Musculoskeletal:  Negative for back pain.  Neurological:  Positive for headaches. Negative for syncope and light-headedness.  Psychiatric/Behavioral:  Negative for dysphoric mood.   Objective:  BP (!) 120/104    Pulse 84    Temp 98.4 F (36.9 C) (Temporal)    Ht 5\' 5"  (1.651 m)    Wt 235 lb 6 oz (106.8 kg)    SpO2 96%    BMI 39.17 kg/m   Wt Readings from Last 3 Encounters:  08/07/21 235 lb 6 oz (106.8 kg)  07/10/21 226 lb (102.5 kg)  06/01/21 225 lb (102.1 kg)      Physical Exam Constitutional:      General: She is not in acute distress.    Appearance: Normal appearance. She is well-developed. She is not ill-appearing or toxic-appearing.  HENT:     Head: Normocephalic.     Right Ear: Hearing, tympanic membrane, ear canal and external ear normal. Tympanic membrane is not erythematous, retracted or bulging.     Left Ear: Hearing, tympanic membrane, ear canal and external ear normal. Tympanic membrane is not erythematous, retracted or bulging.     Nose: No mucosal edema or rhinorrhea.     Right Sinus: No maxillary sinus tenderness or frontal sinus tenderness.     Left Sinus: No maxillary sinus tenderness or frontal sinus tenderness.     Mouth/Throat:     Pharynx: Uvula midline.  Eyes:     General: Lids are normal. Lids are everted, no foreign bodies appreciated.     Conjunctiva/sclera: Conjunctivae normal.     Pupils: Pupils are equal, round, and reactive to light.  Neck:     Thyroid: No thyroid mass or thyromegaly.     Vascular: No carotid bruit.     Trachea: Trachea normal.  Cardiovascular:     Rate and Rhythm: Normal rate and regular rhythm.     Pulses: Normal pulses.     Heart sounds: Normal heart sounds, S1 normal and S2 normal. No murmur heard.   No friction rub. No gallop.  Pulmonary:     Effort: Pulmonary effort is normal. No tachypnea or  respiratory distress.     Breath sounds: Normal breath sounds. No decreased breath sounds, wheezing, rhonchi or rales.  Abdominal:     General: Bowel sounds are normal.     Palpations: Abdomen is soft.     Tenderness: There is no abdominal tenderness.  Musculoskeletal:     Cervical back: Normal range of motion and neck supple.  Skin:  General: Skin is warm and dry.     Findings: No rash.  Neurological:     Mental Status: She is alert.  Psychiatric:        Mood and Affect: Mood is not anxious or depressed.        Speech: Speech normal.        Behavior: Behavior normal. Behavior is cooperative.        Thought Content: Thought content normal.        Judgment: Judgment normal.      Results for orders placed or performed during the hospital encounter of [id number]  Basic metabolic panel  Result Value Ref Range   Sodium 138 135 - 145 mmol/L   Potassium 3.8 3.5 - 5.1 mmol/L   Chloride 102 98 - 111 mmol/L   CO2 29 22 - 32 mmol/L   Glucose, Bld 99 70 - 99 mg/dL   BUN 17 6 - 20 mg/dL   Creatinine, Ser 0.64 0.44 - 1.00 mg/dL   Calcium 9.1 8.9 - 10.3 mg/dL   GFR, Estimated >60 >60 mL/min   Anion gap 7 5 - 15  CBC  Result Value Ref Range   WBC 11.1 (H) 4.0 - 10.5 K/uL   RBC 4.42 3.87 - 5.11 MIL/uL   Hemoglobin 13.6 12.0 - 15.0 g/dL   HCT 41.1 36.0 - 46.0 %   MCV 93.0 80.0 - 100.0 fL   MCH 30.8 26.0 - 34.0 pg   MCHC 33.1 30.0 - 36.0 g/dL   RDW 13.8 11.5 - 15.5 %   Platelets 321 150 - 400 K/uL   nRBC 0.0 0.0 - 0.2 %  Troponin I (High Sensitivity)  Result Value Ref Range   Troponin I (High Sensitivity) 2 <18 ng/L  Troponin I (High Sensitivity)  Result Value Ref Range   Troponin I (High Sensitivity) 3 <18 ng/L    This visit occurred during the SARS-CoV-2 public health emergency.  Safety protocols were in place, including screening questions prior to the visit, additional usage of staff PPE, and extensive cleaning of exam room while observing appropriate contact time as indicated  for disinfecting solutions.   COVID 19 screen:  No recent travel or known exposure to COVID19 The patient denies respiratory symptoms of COVID 19 at this time. The importance of social distancing was discussed today.   Assessment and Plan    Problem List Items Addressed This Visit     Primary hypertension - Primary    Stop meloxicam. Avoid decongestants.  Decrease caffeine, salt,  Work on stress reduction.   Work on healthy eating (low carb and get back to regular exercise, low impact. Start  losartan 25 mg daily. May need higher dose or additon of diuretic. Follow BP at home goal < 140/90. Bring measurements to next OV.      Relevant Medications   losartan (COZAAR) 25 MG tablet   Venous insufficiency of both lower extremities     Wear compression hose 15-30 mmHg to prevent peripheral swelling and varicose veins      Relevant Medications   losartan (COZAAR) 25 MG tablet   Meds ordered this encounter  Medications   losartan (COZAAR) 25 MG tablet    Sig: Take 1 tablet (25 mg total) by mouth daily.    Dispense:  30 tablet    Refill:  3      Eliezer Lofts, MD

## 2021-08-07 NOTE — Patient Instructions (Addendum)
Stop meloxicam. Avoid decongestants.  Decrease caffeine, salt,  Work on stress reduction.  Wear compression hose 15-30 mmHg to prevent peripheral swelling and varicose veins  Work on healthy eating (low carb and get back to regular exercise, low impact. Start  losartan 25 mg daily. May need higher dose or additon of diuretic. Follow BP at home goal < 140/90. Bring measurements to next OV.

## 2021-08-07 NOTE — ED Notes (Signed)
No answer in lobby when called for repeat vitals °

## 2021-08-08 NOTE — Telephone Encounter (Signed)
Noted, thanks! They must have scheduled her within the last 2 weeks.

## 2021-08-08 NOTE — Telephone Encounter (Signed)
It looks like the patient is schedule for 08/31/2021 with Nutrition Specialist

## 2021-08-21 ENCOUNTER — Ambulatory Visit (INDEPENDENT_AMBULATORY_CARE_PROVIDER_SITE_OTHER): Payer: 59 | Admitting: Primary Care

## 2021-08-21 ENCOUNTER — Other Ambulatory Visit: Payer: Self-pay

## 2021-08-21 ENCOUNTER — Encounter: Payer: Self-pay | Admitting: Primary Care

## 2021-08-21 VITALS — BP 120/80 | HR 78 | Temp 97.9°F | Ht 65.0 in | Wt 232.0 lb

## 2021-08-21 DIAGNOSIS — I10 Essential (primary) hypertension: Secondary | ICD-10-CM

## 2021-08-21 DIAGNOSIS — E785 Hyperlipidemia, unspecified: Secondary | ICD-10-CM | POA: Diagnosis not present

## 2021-08-21 LAB — BASIC METABOLIC PANEL
BUN: 10 mg/dL (ref 6–23)
CO2: 31 mEq/L (ref 19–32)
Calcium: 9.1 mg/dL (ref 8.4–10.5)
Chloride: 104 mEq/L (ref 96–112)
Creatinine, Ser: 0.66 mg/dL (ref 0.40–1.20)
GFR: 98.56 mL/min (ref >60.00)
Glucose, Bld: 91 mg/dL (ref 70–99)
Potassium: 4.2 mEq/L (ref 3.5–5.1)
Sodium: 140 mEq/L (ref 135–145)

## 2021-08-21 NOTE — Assessment & Plan Note (Signed)
Improved on rosuvastatin 10 mg, continue same. Reviewed lipid panel from December 2022.

## 2021-08-21 NOTE — Patient Instructions (Signed)
Stop by the lab prior to leaving today. I will notify you of your results once received.   Continue losartan 25 mg daily for blood pressure.  It was a pleasure to see you today!   

## 2021-08-21 NOTE — Progress Notes (Signed)
Subjective:    Patient ID: Amanda Dixon, female    DOB: 07/21/1966, 56 y.o.   MRN: [id number]  HPI  Amanda Dixon is a very pleasant 56 y.o. female with a history of migraines, hypertension, chronic cough, obesity, GAD, hyperlipidemia who presents today for follow up of hypertension.   She originally presented to [facility] on 08/06/21 for headache and hypertension with BP ranging [id number] systolic as checked at a local fire department. During her urgent care visit automatic systolic BP reading was [id number], however manual readings were in the 123XX123 systolic. She was advised to go to the ED.   She presented to [facility] ED on 08/06/21, BP was noted to be 159/106. She was never seen as she left after waiting for 10 hours.   She presented to our office on 08/07/21, evaluated by Dr. Diona Browner, BP was 120/104. She was advised to stop meloxicam, avoid decongestants, reduce stress, wear compression hose. She was initiated on losartan 25 mg and advised to follow up.  Today she endorses compliance to her losartan 25 mg. Headaches have improved overall, mostly occur with stress now. She is checking BP at home which is running 130's-140's.  She is motivated to work on weight loss, has connected with a nutritionist, has also inquired with [facility], is working with a Physiological scientist.   She is compliant to rosuvastatin 10 mg which was initiated in November 2022 for LDL of 184. Repeat LDL in mid December with LDL of 97.  BP Readings from Last 3 Encounters:  08/21/21 120/80  08/07/21 (!) 120/104  08/06/21 (!) 121/105          Review of Systems  Eyes:  Negative for visual disturbance.  Respiratory:  Negative for shortness of breath.   Cardiovascular:  Negative for chest pain.  Neurological:  Positive for headaches. Negative for dizziness.        Past Medical History:  Diagnosis Date   Asthma    Sinus trouble     Social History   Socioeconomic History    Marital status: Married    Spouse name: Not on file   Number of children: Not on file   Years of education: Not on file   Highest education level: Not on file  Occupational History   Occupation: Scientist, water quality  Tobacco Use   Smoking status: Never   Smokeless tobacco: Never   Tobacco comments:    NEVER SMOKER:  EXPOSED TO 2ND(HUSBAND) X SEVERAL YEARS.   Vaping Use   Vaping Use: Never used  Substance and Sexual Activity   Alcohol use: No   Drug use: No   Sexual activity: Yes    Partners: Male    Birth control/protection: Surgical    Comment: Hysterectomy   Other Topics Concern   Not on file  Social History Narrative   Not on file   Social Determinants of Health   Financial Resource Strain: Not on file  Food Insecurity: Not on file  Transportation Needs: Not on file  Physical Activity: Not on file  Stress: Not on file  Social Connections: Not on file  Intimate Partner Violence: Not on file    Past Surgical History:  Procedure Laterality Date   ABDOMINAL HYSTERECTOMY  2008   supracervical   CESAREAN SECTION     CHOLECYSTECTOMY  1996/97?   TONSILLECTOMY      Family History  Problem Relation Age of Onset   Heart disease Maternal Grandfather    Emphysema  Maternal Grandfather    Asthma Maternal Grandfather    Heart disease Paternal Grandfather    Heart disease Father        multpile bypass   Emphysema Paternal Grandmother    Diabetes Paternal Grandmother    Allergies Sister    Diabetes Sister    Asthma Sister    Eczema Sister    Allergies Son    Allergies Mother    Allergies Other        mothers side   Asthma Other        father side   Asthma Maternal Aunt    Diabetes Paternal Aunt    Diabetes Paternal Uncle    Breast cancer Neg Hx     Allergies  Allergen Reactions   Other Swelling    Bees/Wasps- redness    Current Outpatient Medications on File Prior to Visit  Medication Sig Dispense Refill   albuterol (VENTOLIN HFA) 108 (90 Base) MCG/ACT  inhaler Inhale 2 puffs into the lungs every 6 (six) hours as needed for wheezing or shortness of breath. 18 g 0   famotidine (PEPCID) 20 MG tablet One at bedtime 30 tablet 11   fluticasone furoate-vilanterol (BREO ELLIPTA) 200-25 MCG/ACT AEPB Inhale 1 puff into the lungs daily. 3 each 3   losartan (COZAAR) 25 MG tablet Take 1 tablet (25 mg total) by mouth daily. 30 tablet 3   PARoxetine (PAXIL) 20 MG tablet Take 1 tablet (20 mg total) by mouth daily. For anxiety. 90 tablet 0   rosuvastatin (CRESTOR) 10 MG tablet Take 1 tablet (10 mg total) by mouth daily. For cholesterol. 90 tablet 3   SUMAtriptan (IMITREX) 50 MG tablet TAKE 1 TABLET MY MOUTH AT MIGRAINE ONSET. MAY REPEAT IN 2 HOURS IF HEADACHE PERSISTS OR RECURS. 10 tablet 5   triamcinolone (NASACORT AQ) 55 MCG/ACT nasal inhaler Place 2 sprays into the nose daily. 1 Inhaler 0   meloxicam (MOBIC) 7.5 MG tablet Take 7.5 mg by mouth 2 (two) times daily. (Patient not taking: Reported on 08/21/2021)     No current facility-administered medications on file prior to visit.    BP 120/80    Pulse 78    Temp 97.9 F (36.6 C) (Temporal)    Ht 5\' 5"  (1.651 m)    Wt 232 lb (105.2 kg)    SpO2 97%    BMI 38.61 kg/m  Objective:   Physical Exam Cardiovascular:     Rate and Rhythm: Normal rate and regular rhythm.  Pulmonary:     Effort: Pulmonary effort is normal.     Breath sounds: Normal breath sounds.  Musculoskeletal:     Cervical back: Neck supple.  Skin:    General: Skin is warm and dry.          Assessment & Plan:      This visit occurred during the SARS-CoV-2 public health emergency.  Safety protocols were in place, including screening questions prior to the visit, additional usage of staff PPE, and extensive cleaning of exam room while observing appropriate contact time as indicated for disinfecting solutions.

## 2021-08-21 NOTE — Assessment & Plan Note (Addendum)
Improved and at goal on losartan 25 mg. BMP pending today.  Continue losartan 25 mg daily.  Reviewed urgent care, ED, and Dr. Daphine Deutscher visit notes.

## 2021-08-28 NOTE — Assessment & Plan Note (Signed)
Wear compression hose 15-30 mmHg to prevent peripheral swelling and varicose veins

## 2021-08-28 NOTE — Assessment & Plan Note (Signed)
Stop meloxicam. Avoid decongestants.  Decrease caffeine, salt,  Work on stress reduction.   Work on healthy eating (low carb and get back to regular exercise, low impact. Start  losartan 25 mg daily. May need higher dose or additon of diuretic. Follow BP at home goal < 140/90. Bring measurements to next OV.

## 2021-08-31 ENCOUNTER — Encounter: Payer: 59 | Attending: Primary Care | Admitting: Dietician

## 2021-08-31 ENCOUNTER — Other Ambulatory Visit: Payer: Self-pay

## 2021-08-31 ENCOUNTER — Encounter: Payer: Self-pay | Admitting: Dietician

## 2021-08-31 VITALS — Ht 65.0 in | Wt 233.0 lb

## 2021-08-31 DIAGNOSIS — E669 Obesity, unspecified: Secondary | ICD-10-CM

## 2021-08-31 DIAGNOSIS — R7303 Prediabetes: Secondary | ICD-10-CM | POA: Insufficient documentation

## 2021-08-31 DIAGNOSIS — E785 Hyperlipidemia, unspecified: Secondary | ICD-10-CM | POA: Insufficient documentation

## 2021-08-31 DIAGNOSIS — Z6838 Body mass index (BMI) 38.0-38.9, adult: Secondary | ICD-10-CM | POA: Insufficient documentation

## 2021-08-31 DIAGNOSIS — Z713 Dietary counseling and surveillance: Secondary | ICD-10-CM | POA: Diagnosis not present

## 2021-08-31 NOTE — Progress Notes (Signed)
Medical Nutrition Therapy: Visit start time: 0820  end time: 0930  Assessment:  Diagnosis: hyperlipidemia, pre-diabetes Past medical history: HTN, obesity, GERD Psychosocial issues/ stress concerns: reports high stress level, does eat more with stress  Preferred learning method:  Auditory Visual Hands-on   Current weight: 233lbs Height: 5'5" BMI: 38.77 Medications, supplements: reconciled list in medical record  Progress and evaluation:  Reports high BP yesterday 183/93, woke up during night and checked BP at 123/80. She does monitor BP regularly Has done optivia program, keto diet -- worked well until major life event and then went off diet. She reports her weight has fluctuated between 140lbs and 240lbs over the past 20 years.  Recent labwork indicates: HbA1C 5.7% (06/01/21); total cholesterol 165, LDL 97, HDL 48.7, triglycerides 97 (07/10/21), down from total chol 262, LDL 184, HDL 47.8, Triglcyerides 151 (06/01/21) She is helping with care for several elderly family members. Reports husband is supportive.   Physical activity: cardio/ weights/ boxing 30-60 minutes 5x a week  Dietary Intake:  Usual eating pattern includes 3 meals and 3-4 snacks per day. Dining out frequency: 2 meals per week.  Breakfast: fairlife choc protein shake early am, occ second shake after workout Snack: yogurt at home or work; peanut butter or jam on toast  Protein bar; kind bar; fruit Lunch: catered several times / month -- sub/ chicken/ or beef with veg Snack: sometimes 3pm  fruit/ yogurt/ protein shake; if not avail, might eat candy Supper: spaghetti/ lasagna/ pork chop grilled or fried/ steak/ roast beef + potato + veg/ pizza out 1-2x a week/ chicken stir fry/ salad with shrimp/ macaroni and peas Snack: cheez-its/ nab crackers/ cashews or almonds/ rarely milkshake/ chocolate Beverages: water, diet soda, regular Coke to stave off migraine -- then craves more coke  Nutrition Care Education: Topics  covered:      Weight control: importance of low sugar and low fat choices, portion control ; managing stress eating; avoiding mindless eating/ grazing Diabetes:  appropriate meal and snack schedule, appropriate carb intake and balance, healthy carb choices, role of fiber, protein, fat Hypertension:  identifying food sources of potassium, magnesium Hyperlipidemia: healthy and unhealthy fats, role of fiber, role of exercise  Nutritional Diagnosis:  Ethete-2.2 Altered nutrition-related laboratory As related to hyperlipidemia, pre-diabetes, hypertension.  As evidenced by elevated HbA1C, history of elevated total cholesterol, LDL, and triglycerides, and elevated BP. Tyler-3.3 Overweight/obesity As related to excess calories, stress, history of inadequate physical activity.  As evidenced by patient with current BMI of 38.77.  Intervention:  Instruction and discussion as noted above. Focused time on stress management in regards to stress eating. No follow up scheduled; patient plans to seek help from behavioral health counselor first. Nutrition goal for patient is to work on reducing stress eating.  Education Materials given:  General diet guidelines for Diabetes Tips for Managing Food Cravings 52 Proven Stress Reducers Visit summary with goals/ instructions   Learner/ who was taught:  Patient    Level of understanding: Verbalizes/ demonstrates competency   Demonstrated degree of understanding via:   Teach back Learning barriers: None  Willingness to learn/ readiness for change: Acceptance, ready for change   Monitoring and Evaluation:  Dietary intake, exercise, blood sugar, blood lipids, blood pressure, stress level, and body weight      follow up: prn

## 2021-08-31 NOTE — Patient Instructions (Signed)
Reduce caffeine intake to help with stress management. Don't try to be perfect.  Continue to make healthy food choices and control portions as able.

## 2021-09-04 ENCOUNTER — Other Ambulatory Visit: Payer: Self-pay | Admitting: Primary Care

## 2021-09-04 DIAGNOSIS — F411 Generalized anxiety disorder: Secondary | ICD-10-CM

## 2021-09-25 ENCOUNTER — Other Ambulatory Visit: Payer: Self-pay | Admitting: Primary Care

## 2021-09-25 DIAGNOSIS — J45990 Exercise induced bronchospasm: Secondary | ICD-10-CM

## 2021-11-08 ENCOUNTER — Telehealth (INDEPENDENT_AMBULATORY_CARE_PROVIDER_SITE_OTHER): Payer: 59 | Admitting: Primary Care

## 2021-11-08 ENCOUNTER — Encounter: Payer: Self-pay | Admitting: Primary Care

## 2021-11-08 DIAGNOSIS — F411 Generalized anxiety disorder: Secondary | ICD-10-CM | POA: Diagnosis not present

## 2021-11-08 MED ORDER — PAROXETINE HCL 40 MG PO TABS: 40.0000 mg | ORAL_TABLET | ORAL | 0 refills | Status: DC

## 2021-11-08 NOTE — Progress Notes (Signed)
? ? Patient ID: Amanda Dixon, female    DOB: 1966-04-01, 56 y.o.   MRN: [id number] ? ?Virtual visit completed through [employer], a video enabled telemedicine application. Due to national recommendations of social distancing due to COVID-19, a virtual visit is felt to be most appropriate for this patient at this time. Reviewed limitations, risks, security and privacy concerns of performing a virtual visit and the availability of in person appointments. I also reviewed that there may be a patient responsible charge related to this service. The patient agreed to proceed.  ? ?Patient location: home ?Provider location: Financial controller at [facility], office ?Persons participating in this virtual visit: patient, provider  ? ?If any vitals were documented, they were collected by patient at home unless specified below.   ? ?Ht _0  (1.651 m)   Wt 231 lb (104.8 kg)   BMI 38.44 kg/m?   ? ?CC: Anxiety ?Subjective:  ? ?HPI: ?Amanda Dixon is a 56 y.o. female with a history of GAD, migraines, hyperlipidemia presenting on 11/08/2021 to discuss anxiety. ? ?She was last evaluated for anxiety by me on 07/10/21. During this visit she endorsed an improvement in her anxiety since her paroxetine was increased to 20 mg.  ? ?She sent a message via MyChart earlier this week reporting increased stress and uncontrolled anxiety from recent life events over the last few months. Given this information we asked for her to meet.  ? ?Today she endorses several deaths in the family, decreased cogitative decline in her mother, increased stress at work, several family members have been victims of hackers who have stolen quite a bit of money. She is the sole care provider for her family. She's had to miss several days at work due to this, has not had much support from her boss.  ? ?Symptoms include feeling anxious, feeling overwhelmed, physical and mental exhaustion, fatigue, frustrated. She's compliant to paroxetine 20 mg daily.  ? ?She's met with a  therapist through her employer a few times, hasn't really developed a connection and doesn't find value. She has a close group of friends for who she can talk with. Overall she continues to notice improvement on Paxil.  ? ?   ? ?Relevant past medical, surgical, family and social history reviewed and updated as indicated. Interim medical history since our last visit reviewed. ?Allergies and medications reviewed and updated. ?Outpatient Medications Prior to Visit  ?Medication Sig Dispense Refill  ? albuterol (VENTOLIN HFA) 108 (90 Base) MCG/ACT inhaler Inhale 2 puffs into the lungs every 6 (six) hours as needed for wheezing or shortness of breath. 18 g 0  ? famotidine (PEPCID) 20 MG tablet One at bedtime 30 tablet 11  ? fluticasone furoate-vilanterol (BREO ELLIPTA) 200-25 MCG/ACT AEPB Inhale 1 puff into the lungs daily. 3 each 3  ? losartan (COZAAR) 25 MG tablet Take 1 tablet (25 mg total) by mouth daily. 30 tablet 3  ? meloxicam (MOBIC) 7.5 MG tablet Take 7.5 mg by mouth 2 (two) times daily.    ? rosuvastatin (CRESTOR) 10 MG tablet Take 1 tablet (10 mg total) by mouth daily. For cholesterol. 90 tablet 3  ? SUMAtriptan (IMITREX) 50 MG tablet TAKE 1 TABLET MY MOUTH AT MIGRAINE ONSET. MAY REPEAT IN 2 HOURS IF HEADACHE PERSISTS OR RECURS. 10 tablet 5  ? triamcinolone (NASACORT AQ) 55 MCG/ACT nasal inhaler Place 2 sprays into the nose daily. 1 Inhaler 0  ? PARoxetine (PAXIL) 20 MG tablet TAKE 1 TABLET (20 MG TOTAL) BY MOUTH  DAILY. FOR ANXIETY. 90 tablet 2  ? ?No facility-administered medications prior to visit.  ?  ? ?Per HPI unless specifically indicated in ROS section below ?Review of Systems  ?Constitutional:  Positive for fatigue.  ?Psychiatric/Behavioral:  Positive for sleep disturbance. The patient is nervous/anxious.   ?Objective:  ?Ht _0  (1.651 m)   Wt 231 lb (104.8 kg)   BMI 38.44 kg/m?   ?Wt Readings from Last 3 Encounters:  ?11/08/21 231 lb (104.8 kg)  ?08/31/21 233 lb (105.7 kg)  ?08/21/21 232 lb (105.2  kg)  ?  ?  ? ?Physical exam: ?General: Alert and oriented x 3, no distress, does not appear sickly ? ?Pulmonary: Speaks in complete sentences without increased work of breathing, no cough during visit. ? ?Psychiatric: Appears anxious today.  ? ?   ?Results for orders placed or performed in visit on 08/21/21  ?Basic metabolic panel  ?Result Value Ref Range  ? Sodium 140 135 - 145 mEq/L  ? Potassium 4.2 3.5 - 5.1 mEq/L  ? Chloride 104 96 - 112 mEq/L  ? CO2 31 19 - 32 mEq/L  ? Glucose, Bld 91 70 - 99 mg/dL  ? BUN 10 6 - 23 mg/dL  ? Creatinine, Ser 0.66 0.40 - 1.20 mg/dL  ? GFR 98.56 >60.00 mL/min  ? Calcium 9.1 8.4 - 10.5 mg/dL  ? ?Assessment & Plan:  ? ?Problem List Items Addressed This Visit   ? ?  ? Other  ? GAD (generalized anxiety disorder)  ?  Uncontrolled given personal and work stressors.  ? ?Since she's noticed improvement on Paxil overall, will increase paroxetine to 40 mg daily.  ? ?I offered therapy referral, she kindly declined.  ? ?We will plan to follow up in 6 weeks for re-evaluation.  ? ? ?  ?  ? Relevant Medications  ? PARoxetine (PAXIL) 40 MG tablet  ?  ? ?Meds ordered this encounter  ?Medications  ? PARoxetine (PAXIL) 40 MG tablet  ?  Sig: Take 1 tablet (40 mg total) by mouth every morning. For anxiety  ?  Dispense:  90 tablet  ?  Refill:  0  ?  Order Specific Question:   Supervising Provider  ?  Answer:   BEDSOLE, AMY E [[id number]]  ? ?No orders of the defined types were placed in this encounter. ? ? ? ?I discussed the assessment and treatment plan with the patient. The patient was provided an opportunity to ask questions and all were answered. The patient agreed with the plan and demonstrated an understanding of the instructions. The patient was advised to call back or seek an in-person evaluation if the symptoms worsen or if the condition fails to improve as anticipated. ? ?Follow up plan: ? ?We increased your dose of paroxetine to 40 mg. I sent a new prescription to your pharmacy. Take 1 tablet by  mouth daily. ? ?Please schedule a follow up visit for 6 weeks for follow up of anxiety/depression. ? ?It was a pleasure to see you today! ? ? ?Pleas Koch, NP  ? ?

## 2021-11-08 NOTE — Assessment & Plan Note (Addendum)
Uncontrolled given personal and work stressors.  ? ?Since she's noticed improvement on Paxil overall, will increase paroxetine to 40 mg daily.  ? ?I offered therapy referral, she kindly declined.  ?I also offered FMLA, she kindly declines for now. ? ?We will plan to follow up in 6 weeks for re-evaluation.  ? ?30 minutes was spent face-to-face with patient discussing symptoms and developing a plan for treatment. ? ? ?

## 2021-11-08 NOTE — Patient Instructions (Signed)
We increased your dose of paroxetine to 40 mg. I sent a new prescription to your pharmacy. Take 1 tablet by mouth daily. ? ?Please schedule a follow up visit for 6 weeks for follow up of anxiety/depression. ? ?It was a pleasure to see you today! ?

## 2021-11-21 ENCOUNTER — Encounter: Payer: Self-pay | Admitting: Family Medicine

## 2021-11-21 DIAGNOSIS — E785 Hyperlipidemia, unspecified: Secondary | ICD-10-CM

## 2021-11-22 MED ORDER — ROSUVASTATIN CALCIUM 10 MG PO TABS: 10.0000 mg | ORAL_TABLET | Freq: Every day | ORAL | 0 refills | Status: DC

## 2021-12-16 ENCOUNTER — Other Ambulatory Visit: Payer: Self-pay | Admitting: Family Medicine

## 2021-12-16 DIAGNOSIS — I10 Essential (primary) hypertension: Secondary | ICD-10-CM

## 2021-12-17 NOTE — Telephone Encounter (Signed)
Patient needs office visit follow up for BP check and anything else she'd like to discuss. Let me know once she has been scheduled.

## 2021-12-19 NOTE — Telephone Encounter (Signed)
Looks like patient has app 5/26

## 2021-12-21 ENCOUNTER — Ambulatory Visit (INDEPENDENT_AMBULATORY_CARE_PROVIDER_SITE_OTHER): Payer: 59 | Admitting: Primary Care

## 2021-12-21 ENCOUNTER — Encounter: Payer: Self-pay | Admitting: Primary Care

## 2021-12-21 VITALS — BP 128/80 | HR 89 | Ht 65.0 in | Wt 240.1 lb

## 2021-12-21 DIAGNOSIS — R7303 Prediabetes: Secondary | ICD-10-CM | POA: Diagnosis not present

## 2021-12-21 DIAGNOSIS — R5382 Chronic fatigue, unspecified: Secondary | ICD-10-CM

## 2021-12-21 DIAGNOSIS — G4710 Hypersomnia, unspecified: Secondary | ICD-10-CM

## 2021-12-21 DIAGNOSIS — R0683 Snoring: Secondary | ICD-10-CM | POA: Diagnosis not present

## 2021-12-21 DIAGNOSIS — F411 Generalized anxiety disorder: Secondary | ICD-10-CM | POA: Diagnosis not present

## 2021-12-21 LAB — VITAMIN B12: Vitamin B-12: 357 pg/mL (ref 211–911)

## 2021-12-21 LAB — HEMOGLOBIN A1C: Hgb A1c MFr Bld: 5.9 % (ref 4.6–6.5)

## 2021-12-21 MED ORDER — BUSPIRONE HCL 5 MG PO TABS: 5.0000 mg | ORAL_TABLET | Freq: Two times a day (BID) | ORAL | 0 refills | Status: DC

## 2021-12-21 NOTE — Assessment & Plan Note (Signed)
Symptoms suspicious for sleep apnea.  She agrees to sleep study, referral placed to pulmonology.

## 2021-12-21 NOTE — Patient Instructions (Signed)
Start buspirone (Buspar) 5 mg for anxiety. Take 1 tablet by mouth daily for 5 days, then increase to 5 mg twice daily thereafter.   Continue paroxetine 40 mg daily.   Stop by the lab prior to leaving today. I will notify you of your results once received.   You will be contacted regarding your referral to pulmonology for the sleep study.  Please let us know if you have not been contacted within two weeks.   It was a pleasure to see you today!

## 2021-12-21 NOTE — Assessment & Plan Note (Signed)
Suspect this is multifactorial with anxiety, probable sleep apnea, sleep disturbance.  Labs reviewed from November 2022. Checking A1C and B12 levels today.

## 2021-12-21 NOTE — Progress Notes (Signed)
Subjective:    Patient ID: Amanda Dixon, female    DOB: July 29, 1966, 56 y.o.   MRN: [id number]  HPI  Amanda Dixon is a very pleasant 56 y.o. female with a history of chronic migraines, hypertension, GAD, hypersomnia, hyperlipidemia who presents today to discuss several concerns.  1) Sleep Disturbance/Hypersomnia: History of chronic anxiety and is currently managed on paroxetine 40 mg daily. She is under a lot of personal stress. Overall symptoms of anxiety have improved since her paroxetine was increased to 40 mg. She does continue to experience some symptoms.   She will go to bed around 9:30 pm, is not watching TV prior to bed. She has no difficulty falling asleep but will wake every morning around 2-3 am every morning. She snores loudly which has progressed per her husband. She also has daytime drowsiness, feels that she can nod off at any point during the day. She has woken up from sleep "choking" at times.   She's never undergone a sleep study.   2) Fatigue: Chronic since November/December 2022. She has no energy, joint aches to knees/hips/back/shoulders. She is following with PT for her shoulder and back without much improvement.    She is checking BP at home which is varying from 120's-160's/70's-110's.   She snores loudly, doesn't sleep well as she is waking during the night. No regular exercise. Labs collected in November 2022 including TSH, CBC which were without evidence of thyroid disorder or anemia.   She continues to struggle with some anxiety, the increased dose of paroxetine to 40 mg has helped some. She thinks she may be experiencing some menopausal symptoms. Partial hysterectomy in 2008.  BP Readings from Last 3 Encounters:  12/21/21 128/80  08/21/21 120/80  08/07/21 (!) 120/104        Review of Systems  Constitutional:  Positive for fatigue.  Respiratory:         Snoring, daytime drowsiness   Psychiatric/Behavioral:  Positive for sleep disturbance. The  patient is nervous/anxious.         Past Medical History:  Diagnosis Date   Asthma    Sinus trouble     Social History   Socioeconomic History   Marital status: Married    Spouse name: Not on file   Number of children: Not on file   Years of education: Not on file   Highest education level: Not on file  Occupational History   Occupation: Research scientist (medical)  Tobacco Use   Smoking status: Never   Smokeless tobacco: Never   Tobacco comments:    NEVER SMOKER:  EXPOSED TO 2ND(HUSBAND) X SEVERAL YEARS.   Vaping Use   Vaping Use: Never used  Substance and Sexual Activity   Alcohol use: No   Drug use: No   Sexual activity: Yes    Partners: Male    Birth control/protection: Surgical    Comment: Hysterectomy   Other Topics Concern   Not on file  Social History Narrative   Not on file   Social Determinants of Health   Financial Resource Strain: Not on file  Food Insecurity: Not on file  Transportation Needs: Not on file  Physical Activity: Not on file  Stress: Not on file  Social Connections: Not on file  Intimate Partner Violence: Not on file    Past Surgical History:  Procedure Laterality Date   ABDOMINAL HYSTERECTOMY  2008   supracervical   CESAREAN SECTION     CHOLECYSTECTOMY  1996/97?   TONSILLECTOMY  Family History  Problem Relation Age of Onset   Heart disease Maternal Grandfather    Emphysema Maternal Grandfather    Asthma Maternal Grandfather    Heart disease Paternal Grandfather    Heart disease Father        multpile bypass   Emphysema Paternal Grandmother    Diabetes Paternal Grandmother    Allergies Sister    Diabetes Sister    Asthma Sister    Eczema Sister    Allergies Son    Allergies Mother    Allergies Other        mothers side   Asthma Other        father side   Asthma Maternal Aunt    Diabetes Paternal Aunt    Diabetes Paternal Uncle    Breast cancer Neg Hx     Allergies  Allergen Reactions   Other Swelling     Bees/Wasps- redness    Current Outpatient Medications on File Prior to Visit  Medication Sig Dispense Refill   albuterol (VENTOLIN HFA) 108 (90 Base) MCG/ACT inhaler Inhale 2 puffs into the lungs every 6 (six) hours as needed for wheezing or shortness of breath. 18 g 0   famotidine (PEPCID) 20 MG tablet One at bedtime 30 tablet 11   fluticasone furoate-vilanterol (BREO ELLIPTA) 200-25 MCG/ACT AEPB Inhale 1 puff into the lungs daily. 3 each 3   losartan (COZAAR) 25 MG tablet Take 1 tablet (25 mg total) by mouth daily. 30 tablet 3   meloxicam (MOBIC) 7.5 MG tablet Take 7.5 mg by mouth 2 (two) times daily.     PARoxetine (PAXIL) 40 MG tablet Take 1 tablet (40 mg total) by mouth every morning. For anxiety 90 tablet 0   rosuvastatin (CRESTOR) 10 MG tablet Take 1 tablet (10 mg total) by mouth daily. For cholesterol. 90 tablet 0   SUMAtriptan (IMITREX) 50 MG tablet TAKE 1 TABLET MY MOUTH AT MIGRAINE ONSET. MAY REPEAT IN 2 HOURS IF HEADACHE PERSISTS OR RECURS. 10 tablet 5   triamcinolone (NASACORT AQ) 55 MCG/ACT nasal inhaler Place 2 sprays into the nose daily. 1 Inhaler 0   No current facility-administered medications on file prior to visit.    BP 128/80   Pulse 89   Ht 5\' 5"  (1.651 m)   Wt 240 lb 2 oz (108.9 kg)   SpO2 99%   BMI 39.96 kg/m  Objective:   Physical Exam Cardiovascular:     Rate and Rhythm: Normal rate and regular rhythm.  Pulmonary:     Effort: Pulmonary effort is normal.     Breath sounds: Normal breath sounds.  Musculoskeletal:     Cervical back: Neck supple.  Skin:    General: Skin is warm and dry.  Psychiatric:     Comments: Appears anxious today          Assessment & Plan:

## 2021-12-21 NOTE — Assessment & Plan Note (Addendum)
Improved per patient, although I still see ongoing anxiety symptoms. She agrees.  Continue paroxetine 40 mg daily. Add Buspar 5 mg BID.   She will update.  Continue to monitor.

## 2022-01-07 ENCOUNTER — Ambulatory Visit (INDEPENDENT_AMBULATORY_CARE_PROVIDER_SITE_OTHER): Payer: 59 | Admitting: Primary Care

## 2022-01-07 ENCOUNTER — Encounter: Payer: Self-pay | Admitting: Primary Care

## 2022-01-07 DIAGNOSIS — F411 Generalized anxiety disorder: Secondary | ICD-10-CM

## 2022-01-07 DIAGNOSIS — R0683 Snoring: Secondary | ICD-10-CM | POA: Diagnosis not present

## 2022-01-07 DIAGNOSIS — Z6841 Body Mass Index (BMI) 40.0 and over, adult: Secondary | ICD-10-CM | POA: Diagnosis not present

## 2022-01-07 NOTE — Assessment & Plan Note (Signed)
-   Patient is motivated to lose weight.  She is interested in establishing with healthy weight and wellness. Referral was placed and she as given contact information to set up an appointment.

## 2022-01-07 NOTE — Progress Notes (Signed)
@Patient  ID: Amanda Dixon, female    DOB: April 18, 1966, 56 y.o.   MRN: [id number]  Chief Complaint  Patient presents with   Sleep Consult    Referring provider: Pleas Koch, NP  HPI: 56 year old female, never smoked.  Past medical history significant for hypertension, chronic migraine, venous insufficiency of both lower extremities, mild exercise-induced asthma, chronic fatigue, hyperlipidemia, hypersomnia, obesity.  01/07/2022 Patient presents today for sleep consult.  Patient has symptoms of daytime sleepiness, loud snoring, restless sleep.  She has woken herself up gasping for air.  She is constantly tired. She has a lot of stress at home caring for family members and attributes her fatigue to this.  Her PCP recently increased Paxil and added BuSpar.  She has not been able to exercise since a back injury several months ago.  She has been attending physical therapy.  She was recently released to resume exercise. She has gained 30-40 lbs in the last year.  Typical bedtime is 10 PM.  It does not long for her to fall asleep.  She will wake up on average 2-4 times a night.  She starts her day at 6 AM.  Previa sleep studies.  She is not on CPAP or oxygen.  Epworth score 18.  Sleep questionnaire Symptoms- Louds snoring, wakes up choking, Daytime sleepiness, restless sleep  Prior sleep study- None Bedtime- 10pm Time to fall asleep- <15 mins  Nocturnal awakenings- 2-4 times Out of bed/start of day- 6am Weight changes- up 30 lbs Do you operate heavy machinery- No Do you currently wear CPAP- No Do you current wear oxygen- No Epworth- 18    Allergies  Allergen Reactions   Other Swelling    Bees/Wasps- redness    Immunization History  Administered Date(s) Administered   Influenza Split 04/28/2012   Janssen (J&J) SARS-COV-2 Vaccination 04/24/2020   Tdap 01/25/2019    Past Medical History:  Diagnosis Date   Asthma    Hypertension    Sinus trouble     Tobacco  History: Social History   Tobacco Use  Smoking Status Never  Smokeless Tobacco Never  Tobacco Comments   NEVER SMOKER:  EXPOSED TO 2ND(HUSBAND) X SEVERAL YEARS.    Counseling given: Not Answered Tobacco comments: NEVER SMOKER:  EXPOSED TO 2ND(HUSBAND) X SEVERAL YEARS.    Outpatient Medications Prior to Visit  Medication Sig Dispense Refill   albuterol (VENTOLIN HFA) 108 (90 Base) MCG/ACT inhaler Inhale 2 puffs into the lungs every 6 (six) hours as needed for wheezing or shortness of breath. 18 g 0   busPIRone (BUSPAR) 5 MG tablet Take 1 tablet (5 mg total) by mouth 2 (two) times daily. For anxiety. 60 tablet 0   famotidine (PEPCID) 20 MG tablet One at bedtime 30 tablet 11   fluticasone furoate-vilanterol (BREO ELLIPTA) 200-25 MCG/ACT AEPB Inhale 1 puff into the lungs daily. 3 each 3   losartan (COZAAR) 25 MG tablet Take 1 tablet (25 mg total) by mouth daily. for blood pressure. 90 tablet 1   PARoxetine (PAXIL) 40 MG tablet Take 1 tablet (40 mg total) by mouth every morning. For anxiety 90 tablet 0   rosuvastatin (CRESTOR) 10 MG tablet Take 1 tablet (10 mg total) by mouth daily. For cholesterol. 90 tablet 0   SUMAtriptan (IMITREX) 50 MG tablet TAKE 1 TABLET MY MOUTH AT MIGRAINE ONSET. MAY REPEAT IN 2 HOURS IF HEADACHE PERSISTS OR RECURS. 10 tablet 5   triamcinolone (NASACORT AQ) 55 MCG/ACT nasal inhaler Place 2 sprays into  the nose daily. 1 Inhaler 0   meloxicam (MOBIC) 7.5 MG tablet Take 7.5 mg by mouth 2 (two) times daily. (Patient not taking: Reported on 01/07/2022)     No facility-administered medications prior to visit.    Review of Systems  Review of Systems  Constitutional: Negative.   HENT: Negative.    Respiratory:  Positive for apnea. Negative for cough, chest tightness and wheezing.        Dyspnea with exertion  Cardiovascular: Negative.   Psychiatric/Behavioral:  Positive for sleep disturbance.      Physical Exam  BP 134/76 (BP Location: Left Arm, Cuff Size:  Large)   Pulse 77   Ht 5\' 5"  (1.651 m)   Wt 243 lb (110.2 kg)   SpO2 97% Comment: on RA  BMI 40.44 kg/m  Physical Exam Constitutional:      General: She is not in acute distress.    Appearance: Normal appearance. She is obese. She is not ill-appearing.  HENT:     Head: Normocephalic and atraumatic.     Mouth/Throat:     Mouth: Mucous membranes are moist.     Pharynx: Oropharynx is clear.     Comments: Mallampati class III Cardiovascular:     Rate and Rhythm: Normal rate and regular rhythm.  Pulmonary:     Effort: Pulmonary effort is normal.     Breath sounds: Normal breath sounds.     Comments: CTA Musculoskeletal:        General: Normal range of motion.     Cervical back: Normal range of motion and neck supple.  Skin:    General: Skin is warm and dry.  Neurological:     General: No focal deficit present.     Mental Status: She is alert and oriented to person, place, and time. Mental status is at baseline.  Psychiatric:        Mood and Affect: Mood normal.        Behavior: Behavior normal.        Thought Content: Thought content normal.        Judgment: Judgment normal.      Lab Results:  CBC    Component Value Date/Time   WBC 11.1 (H) 08/06/2021 1746   RBC 4.42 08/06/2021 1746   HGB 13.6 08/06/2021 1746   HCT 41.1 08/06/2021 1746   PLT 321 08/06/2021 1746   MCV 93.0 08/06/2021 1746   MCH 30.8 08/06/2021 1746   MCHC 33.1 08/06/2021 1746   RDW 13.8 08/06/2021 1746   LYMPHSABS 4.0 12/19/2016 1654   MONOABS 0.7 12/19/2016 1654   EOSABS 0.2 12/19/2016 1654   BASOSABS 0.2 (H) 12/19/2016 1654    BMET    Component Value Date/Time   NA 140 08/21/2021 0832   NA 138 01/27/2019 1204   K 4.2 08/21/2021 0832   CL 104 08/21/2021 0832   CO2 31 08/21/2021 0832   GLUCOSE 91 08/21/2021 0832   BUN 10 08/21/2021 0832   BUN 9 01/27/2019 1204   CREATININE 0.66 08/21/2021 0832   CALCIUM 9.1 08/21/2021 0832   GFRNONAA >60 08/06/2021 1746   GFRAA 100 01/27/2019 1204     BNP No results found for: "BNP"  ProBNP    Component Value Date/Time   PROBNP 33.0 01/25/2020 1455    Imaging: No results found.   Assessment & Plan:   Loud snoring - Patient has symptoms loud snoring, waking up gasping for air, restless sleep, excessive daytime sleepiness.  Epworth 18. BMI 40. Concern  patient could have obstructive sleep apnea, needs home sleep study to evaluate. Discussed risk of untreated sleep apnea including cardiac arrhythmias, pulm HTN, stroke, DM. We briefly reviewed treatment options. Encouraged patient to work on weight loss efforts and focus on side sleeping position/elevate head of bed. Advised against driving if experiencing excessive daytime sleepiness. Follow-up in 4-6 weeks to review sleep study results and discuss treatment options further  GAD (generalized anxiety disorder) - Feels anxiety and stress are affecting her sleep schedule and causing a significant mount of her daytime fatigue.  PCP recently increased Paxil and added BuSpar.   Obesity, Class III, BMI 40-49.9 (morbid obesity) ([place]) - Patient is motivated to lose weight.  She is interested in establishing with healthy weight and wellness. Referral was placed and she as given contact information to set up an appointment.    Martyn Ehrich, NP 01/07/2022

## 2022-01-07 NOTE — Patient Instructions (Addendum)
  Sleep apnea is defined as period of 10 seconds or longer when you stop breathing at night. This can happen multiple times a night. Dx sleep apnea is when this occurs more than 5 times an hour.    Mild OSA 5-15 apneic events an hour Moderate OSA 15-30 apneic events an hour Severe OSA > 30 apneic events an hour   Untreated sleep apnea puts you at higher risk for cardiac arrhythmias, pulmonary HTN, stroke and diabetes   Treatment options include weight loss, side sleeping position, oral appliance, CPAP therapy or referral to ENT for possible surgical options    Recommendations: Focus on side sleeping position Work on weight loss efforts  Do not drive if experiencing excessive daytime sleepiness of fatigue   Referral: [facility] (ordered)    Orders: Home sleep study re: loud snoring (ordered)   Follow-up: Once you have had sleep study done please call to schedule follow-up with Beth NP to review sleep study results and treatment if needed

## 2022-01-07 NOTE — Assessment & Plan Note (Signed)
-   Feels anxiety and stress are affecting her sleep schedule and causing a significant mount of her daytime fatigue.  PCP recently increased Paxil and added BuSpar.

## 2022-01-07 NOTE — Assessment & Plan Note (Signed)
-   Patient has symptoms loud snoring, waking up gasping for air, restless sleep, excessive daytime sleepiness.  Epworth 18. BMI 40. Concern patient could have obstructive sleep apnea, needs home sleep study to evaluate. Discussed risk of untreated sleep apnea including cardiac arrhythmias, pulm HTN, stroke, DM. We briefly reviewed treatment options. Encouraged patient to work on weight loss efforts and focus on side sleeping position/elevate head of bed. Advised against driving if experiencing excessive daytime sleepiness. Follow-up in 4-6 weeks to review sleep study results and discuss treatment options further

## 2022-01-07 NOTE — Progress Notes (Signed)
Reviewed and agree with assessment/plan.   Coralyn Helling, MD [facility] Pulmonary/Critical Care 01/07/2022, 1:37 PM Pager:  [phone]

## 2022-01-09 ENCOUNTER — Other Ambulatory Visit: Payer: Self-pay | Admitting: Primary Care

## 2022-01-09 DIAGNOSIS — J45990 Exercise induced bronchospasm: Secondary | ICD-10-CM

## 2022-01-17 ENCOUNTER — Ambulatory Visit
Admission: EM | Admit: 2022-01-17 | Discharge: 2022-01-17 | Disposition: A | Discharge status: Home / Self Care | Payer: 59

## 2022-01-17 DIAGNOSIS — S61217A Laceration without foreign body of left little finger without damage to nail, initial encounter: Secondary | ICD-10-CM

## 2022-01-17 NOTE — ED Provider Notes (Signed)
[name]    CSN: [id number] Arrival date & time: 01/17/22  6599      History   Chief Complaint Chief Complaint  Patient presents with   Laceration    Left pinky laceration     HPI Amanda Dixon is a 56 y.o. female.   HPI Patient presents for evaluation of superficial laceration involving the 5th left finger. Injury occurred 2 days ago. Patient is concern at the appearance of laceration and the wound is not healing appropriately. Last tetanus: 01/25/2019. Immunization History  Administered Date(s) Administered   Influenza Split 04/28/2012   Janssen (J&J) SARS-COV-2 Vaccination 04/24/2020   Tdap 01/25/2019      Past Medical History:  Diagnosis Date   Asthma    Hypertension    Sinus trouble     Patient Active Problem List   Diagnosis Date Noted   Loud snoring 01/07/2022   Chronic fatigue 12/21/2021   Prediabetes 12/21/2021   Primary hypertension 08/07/2021   Venous insufficiency of both lower extremities 08/07/2021   Preventative health care 07/10/2021   Hyperlipidemia 07/10/2021   Elevated blood pressure reading without diagnosis of hypertension 06/01/2021   Chronic migraine without aura without status migrainosus, not intractable 09/20/2019   Peripheral edema 09/20/2019   Allergy to bee sting 01/25/2019   Tinea cruris 11/21/2017   Hypersomnia 12/19/2016   Mild exercise-induced asthma 10/07/2016   Seasonal allergies 10/07/2016   Obesity, Class III, BMI 40-49.9 (morbid obesity) (HCC) 10/07/2016   GAD (generalized anxiety disorder) 10/07/2016   Shortness of breath 02/22/2013   Chronic cough 02/22/2013    Past Surgical History:  Procedure Laterality Date   ABDOMINAL HYSTERECTOMY  2008   supracervical   CESAREAN SECTION     CHOLECYSTECTOMY  1996/97?   TONSILLECTOMY      OB History     Gravida  1   Para  1   Term  1   Preterm      AB      Living  1      SAB      IAB      Ectopic      Multiple      Live Births  1             Home Medications    Prior to Admission medications   Medication Sig Start Date End Date Taking? Authorizing Provider  albuterol (VENTOLIN HFA) 108 (90 Base) MCG/ACT inhaler TAKE 2 PUFFS BY MOUTH EVERY 6 HOURS AS NEEDED FOR WHEEZE OR SHORTNESS OF BREATH 01/10/22   Doreene Nest, NP  busPIRone (BUSPAR) 5 MG tablet Take 1 tablet (5 mg total) by mouth 2 (two) times daily. For anxiety. 12/21/21   Doreene Nest, NP  famotidine (PEPCID) 20 MG tablet One at bedtime 03/04/18   Nyoka Cowden, MD  fluticasone furoate-vilanterol (BREO ELLIPTA) 200-25 MCG/ACT AEPB Inhale 1 puff into the lungs daily. 07/10/21   Doreene Nest, NP  losartan (COZAAR) 25 MG tablet Take 1 tablet (25 mg total) by mouth daily. for blood pressure. 12/21/21   Doreene Nest, NP  meloxicam (MOBIC) 7.5 MG tablet Take 7.5 mg by mouth 2 (two) times daily. Patient not taking: Reported on 01/07/2022 06/19/21   [provider]  PARoxetine (PAXIL) 40 MG tablet Take 1 tablet (40 mg total) by mouth every morning. For anxiety 11/08/21   Doreene Nest, NP  rosuvastatin (CRESTOR) 10 MG tablet Take 1 tablet (10 mg total) by mouth daily. For  cholesterol. 11/22/21   Lynnda Child, MD  SUMAtriptan (IMITREX) 50 MG tablet TAKE 1 TABLET MY MOUTH AT MIGRAINE ONSET. MAY REPEAT IN 2 HOURS IF HEADACHE PERSISTS OR RECURS. 10/18/19   Doreene Nest, NP  triamcinolone (NASACORT AQ) 55 MCG/ACT nasal inhaler Place 2 sprays into the nose daily. 02/22/13   Lupita Leash, MD    Family History Family History  Problem Relation Age of Onset   Heart disease Maternal Grandfather    Emphysema Maternal Grandfather    Asthma Maternal Grandfather    Heart disease Paternal Grandfather    Heart disease Father        multpile bypass   Emphysema Paternal Grandmother    Diabetes Paternal Grandmother    Allergies Sister    Diabetes Sister    Asthma Sister    Eczema Sister    Allergies Son    Allergies Mother     Allergies Other        mothers side   Asthma Other        father side   Asthma Maternal Aunt    Diabetes Paternal Aunt    Diabetes Paternal Uncle    Breast cancer Neg Hx     Social History Social History   Tobacco Use   Smoking status: Never   Smokeless tobacco: Never   Tobacco comments:    NEVER SMOKER:  EXPOSED TO 2ND(HUSBAND) X SEVERAL YEARS.   Vaping Use   Vaping Use: Never used  Substance Use Topics   Alcohol use: No   Drug use: No     Allergies   Other  Review of Systems Review of Systems Pertinent negatives listed in HPI   Physical Exam Triage Vital Signs ED Triage Vitals  Enc Vitals Group     BP 01/17/22 0810 (!) 167/79     Pulse Rate 01/17/22 0810 80     Resp 01/17/22 0810 18     Temp 01/17/22 0810 98.6 F (37 C)     Temp src --      SpO2 01/17/22 0810 96 %     Weight --      Height --      Head Circumference --      Peak Flow --      Pain Score 01/17/22 0819 4     Pain Loc --      Pain Edu? --      Excl. in GC? --    No data found.  Updated Vital Signs BP (!) 167/79   Pulse 80   Temp 98.6 F (37 C)   Resp 18   SpO2 96%   Visual Acuity Right Eye Distance:   Left Eye Distance:   Bilateral Distance:    Right Eye Near:   Left Eye Near:    Bilateral Near:     Physical Exam Constitutional:      Appearance: Normal appearance.  HENT:     Head: Normocephalic and atraumatic.  Cardiovascular:     Rate and Rhythm: Normal rate and regular rhythm.  Pulmonary:     Effort: Pulmonary effort is normal.     Breath sounds: Normal breath sounds.  Musculoskeletal:       Arms:  Skin:    Capillary Refill: Capillary refill takes less than 2 seconds.  Neurological:     General: No focal deficit present.     Mental Status: She is alert.  Psychiatric:        Attention and Perception: Attention normal.  UC Treatments / Results  Labs (all labs ordered are listed, but only abnormal results are displayed) Labs Reviewed - No data to  display  EKG   Radiology No results found.  Procedures Procedures (including critical care time)  Medications Ordered in UC Medications - No data to display  Initial Impression / Assessment and Plan / UC Course  I have reviewed the triage vital signs and the nursing notes.  Pertinent labs & imaging results that were available during my care of the patient were reviewed by me and considered in my medical decision making (see chart for details).    Laceration, 5th digit Appropriate healing. Laceration is very superficial and appears to be well approximated, no signs of infection, scant residual bleeding present. Wound care instructions provided. No indication for laceration repair. Apply antibiotic ointment and change bandage twice daily. Return if signs of infection develop. Final Clinical Impressions(s) / UC Diagnoses   Final diagnoses:  Laceration of left little finger without foreign body without damage to nail, initial encounter     Discharge Instructions      Change dressing twice daily and apply antibiotic ointment twice daily.     ED Prescriptions   None    PDMP not reviewed this encounter.   Bing Neighbors, FNP 01/17/22 [phone]

## 2022-01-17 NOTE — Discharge Instructions (Signed)
Change dressing twice daily and apply antibiotic ointment twice daily.

## 2022-01-17 NOTE — ED Triage Notes (Signed)
Patient presents to Urgent Care with complaints of left pinky laceration since 2 days ago. Concerned with the way its healing. Does not know last Tdap.

## 2022-01-20 ENCOUNTER — Other Ambulatory Visit: Payer: Self-pay | Admitting: Primary Care

## 2022-01-20 DIAGNOSIS — F411 Generalized anxiety disorder: Secondary | ICD-10-CM

## 2022-01-22 ENCOUNTER — Other Ambulatory Visit: Payer: Self-pay

## 2022-01-22 ENCOUNTER — Emergency Department (HOSPITAL_COMMUNITY)
Admission: EM | Admit: 2022-01-22 | Discharge: 2022-01-22 | Disposition: A | Discharge status: Home / Self Care | Payer: 59 | Attending: Emergency Medicine | Admitting: Emergency Medicine

## 2022-01-22 ENCOUNTER — Ambulatory Visit: Payer: 59 | Admitting: Family Medicine

## 2022-01-22 ENCOUNTER — Telehealth: Payer: Self-pay | Admitting: Primary Care

## 2022-01-22 ENCOUNTER — Encounter (HOSPITAL_COMMUNITY): Payer: Self-pay

## 2022-01-22 DIAGNOSIS — I10 Essential (primary) hypertension: Secondary | ICD-10-CM | POA: Diagnosis not present

## 2022-01-22 DIAGNOSIS — Z79899 Other long term (current) drug therapy: Secondary | ICD-10-CM | POA: Insufficient documentation

## 2022-01-22 DIAGNOSIS — R040 Epistaxis: Secondary | ICD-10-CM | POA: Insufficient documentation

## 2022-01-22 MED ORDER — AMLODIPINE BESYLATE 5 MG PO TABS: 5.0000 mg | ORAL_TABLET | Freq: Every day | ORAL | 0 refills | Status: DC

## 2022-01-22 NOTE — Telephone Encounter (Addendum)
ED notes reviewed. Patient should be scheduling follow-up visit with me as instructed by the ED provider.

## 2022-01-22 NOTE — ED Provider Notes (Signed)
[facility]-EMERGENCY DEPT Provider Note   CSN: [id number] Arrival date & time: 01/22/22  1421     History  Chief Complaint  Patient presents with   Epistaxis    Amanda Dixon is a 56 y.o. female.   Epistaxis  Patient is a 56 year old female with past medical history significant for hypertension on losartan in the evenings once daily.  Patient is presented emergency room today with complaints of episodic right-sided epistaxis seems that she has had 4 episodes over the past 7 days.  She denies any lightheadedness or dizziness.  She states that she has not been picking her nose.  She states that she has not tried any medications.  No trauma to her face no recent surgeries and is not on any blood thinners.       Home Medications Prior to Admission medications   Medication Sig Start Date End Date Taking? Authorizing Provider  amLODipine (NORVASC) 5 MG tablet Take 1 tablet (5 mg total) by mouth daily. 01/22/22  Yes Lilymarie Scroggins S, PA  albuterol (VENTOLIN HFA) 108 (90 Base) MCG/ACT inhaler TAKE 2 PUFFS BY MOUTH EVERY 6 HOURS AS NEEDED FOR WHEEZE OR SHORTNESS OF BREATH 01/10/22   Doreene Nest, NP  busPIRone (BUSPAR) 5 MG tablet TAKE 1 TABLET (5 MG TOTAL) BY MOUTH 2 (TWO) TIMES DAILY FOR ANXIETY. 01/20/22   Doreene Nest, NP  famotidine (PEPCID) 20 MG tablet One at bedtime 03/04/18   Nyoka Cowden, MD  fluticasone furoate-vilanterol (BREO ELLIPTA) 200-25 MCG/ACT AEPB Inhale 1 puff into the lungs daily. 07/10/21   Doreene Nest, NP  losartan (COZAAR) 25 MG tablet Take 1 tablet (25 mg total) by mouth daily. for blood pressure. 12/21/21   Doreene Nest, NP  meloxicam (MOBIC) 7.5 MG tablet Take 7.5 mg by mouth 2 (two) times daily. Patient not taking: Reported on 01/07/2022 06/19/21   [provider]  PARoxetine (PAXIL) 40 MG tablet Take 1 tablet (40 mg total) by mouth every morning. For anxiety 11/08/21   Doreene Nest, NP  rosuvastatin  (CRESTOR) 10 MG tablet Take 1 tablet (10 mg total) by mouth daily. For cholesterol. 11/22/21   Lynnda Child, MD  SUMAtriptan (IMITREX) 50 MG tablet TAKE 1 TABLET MY MOUTH AT MIGRAINE ONSET. MAY REPEAT IN 2 HOURS IF HEADACHE PERSISTS OR RECURS. 10/18/19   Doreene Nest, NP  triamcinolone (NASACORT AQ) 55 MCG/ACT nasal inhaler Place 2 sprays into the nose daily. 02/22/13   Lupita Leash, MD      Allergies    Other    Review of Systems   Review of Systems  HENT:  Positive for nosebleeds.     Physical Exam Updated Vital Signs BP (!) 151/102 (BP Location: Left Arm)   Pulse 88   Temp 98.2 F (36.8 C) (Oral)   Resp 18   SpO2 96%  Physical Exam Vitals and nursing note reviewed.  Constitutional:      General: She is not in acute distress.    Appearance: Normal appearance. She is not ill-appearing.  HENT:     Head: Normocephalic and atraumatic.     Nose:     Comments: Right nare with scant dried blood. Eyes:     General: No scleral icterus.       Right eye: No discharge.        Left eye: No discharge.     Conjunctiva/sclera: Conjunctivae normal.  Pulmonary:     Effort: Pulmonary effort  is normal.     Breath sounds: No stridor.  Neurological:     Mental Status: She is alert and oriented to person, place, and time. Mental status is at baseline.     ED Results / Procedures / Treatments   Labs (all labs ordered are listed, but only abnormal results are displayed) Labs Reviewed - No data to display  EKG None  Radiology No results found.  Procedures Procedures    Medications Ordered in ED Medications - No data to display  ED Course/ Medical Decision Making/ A&P Clinical Course as of 01/22/22 1555  Tue Jan 22, 2022  1545 Intermittent epistaxis for the past 1 week.  [WF]    Clinical Course User Index [WF] Gailen Shelter, [place]                           Medical Decision Making  Patient is a 56 year old female with past medical history significant for  hypertension on losartan in the evenings once daily.  Patient is presented emergency room today with complaints of episodic right-sided epistaxis seems that she has had 4 episodes over the past 7 days.  She denies any lightheadedness or dizziness.  She states that she has not been picking her nose.  She states that she has not tried any medications.  No trauma to her face no recent surgeries and is not on any blood thinners.  Physical exam notable for scant blood in right nare.  No active bleeding.   We do lengthy discussion about her elevated blood pressure.  Seems that she has a history of significantly elevated blood pressure.  She is only on losartan.  She takes this in the evenings.  It is please have been occurring during the day.  We will start her on amlodipine 5 mg that she will take in the morning and she can follow-up with her PCP regarding blood pressure control.  Also gave patient information for ear nose and throat for follow-up as needed.  Recommend Vaseline and given instructions on how to stop a nosebleed at home including providing her with a nose clamp.   Final Clinical Impression(s) / ED Diagnoses Final diagnoses:  Epistaxis  Hypertension, unspecified type    Rx / DC Orders ED Discharge Orders          Ordered    amLODipine (NORVASC) 5 MG tablet  Daily        01/22/22 1553              [place], [place] 01/22/22 1556    Arby Barrette, MD 01/25/22 1233

## 2022-01-22 NOTE — ED Triage Notes (Signed)
Pt reports intermittent nose bleeds since Thursday. Pt reports calling PCP and they recommended coming here due to blood pressure being elevated.

## 2022-01-23 ENCOUNTER — Ambulatory Visit: Payer: 59 | Admitting: Primary Care

## 2022-01-23 ENCOUNTER — Telehealth: Payer: Self-pay | Admitting: *Deleted

## 2022-01-23 DIAGNOSIS — F411 Generalized anxiety disorder: Secondary | ICD-10-CM

## 2022-01-23 DIAGNOSIS — G4710 Hypersomnia, unspecified: Secondary | ICD-10-CM

## 2022-01-23 DIAGNOSIS — E785 Hyperlipidemia, unspecified: Secondary | ICD-10-CM

## 2022-01-23 DIAGNOSIS — R7303 Prediabetes: Secondary | ICD-10-CM

## 2022-01-23 DIAGNOSIS — I10 Essential (primary) hypertension: Secondary | ICD-10-CM

## 2022-01-23 DIAGNOSIS — Z9189 Other specified personal risk factors, not elsewhere classified: Secondary | ICD-10-CM

## 2022-01-23 NOTE — Telephone Encounter (Signed)
Spoke to patient by telephone and was advised that her nose bleed for about 45 minutes. Patient stated that the bleeding has now stopped.  Patient scheduled for an appointment tomorrow 01/24/22 with Mayra Reel NP. Patient was given ER precautions and she verbalized understanding.

## 2022-01-23 NOTE — Telephone Encounter (Signed)
Patient has appointment scheduled with you tomorrow.

## 2022-01-23 NOTE — Telephone Encounter (Signed)
PLEASE NOTE: All timestamps contained within this report are represented as [place] Standard Time. CONFIDENTIALTY NOTICE: This fax transmission is intended only for the addressee. It contains information that is legally privileged, confidential or otherwise protected from use or disclosure. If you are not the intended recipient, you are strictly prohibited from reviewing, disclosing, copying using or disseminating any of this information or taking any action in reliance on or regarding this information. If you have received this fax in error, please notify us immediately by telephone so that we can arrange for its return to [place]. Phone: [phone], Toll-Free: [phone], Fax: [phone] Page: 1 of 2 Call Id: [id number] [facility] Day - Client TELEPHONE ADVICE RECORD Access[facility]urse Patient Name: Amanda Dixon Gender: Female DOB: [birth year] Age: 56 Y 1 M 16 D Return Phone Number: [phone] (Primary) Address: City/ State/ Zip: [place] [address] Client [facility] Day - Client Client Site [facility] - Day Provider Vernona Rieger - NP Contact Type Call Who Is Calling Patient / Member / Family / Caregiver Call Type Triage / Clinical Relationship To Patient Self Return Phone Number [phone] (Primary) Chief Complaint NOSEBLEED greater than 30 minutes Reason for Call Symptomatic / Request for Health Information Initial Comment Caller states she has a pt on the line who was triaged for a nose bleed and went to the ER and was advised if she has another nosebleed to contact her PCP caller states she is from the office and they want her triage again. Caller states her nosebleed is pass 30 minutes. Additional Comment Caller disconnected. will need a call bk. Translation No Nurse Assessment Nurse: Alexander Mt, RN, Nicholaus Bloom Date/Time (Eastern Time): 01/23/2022 11:43:45 AM Confirm and document reason for call.  If symptomatic, describe symptoms. ---caller states she was in the Er yesterday morning for a nosebleed and was told if she gets another to call her PCP. States her nose is currently bleeding now and has been bleeding for 30 min. Does the patient have any new or worsening symptoms? ---Yes Will a triage be completed? ---Yes Related visit to physician within the last 2 weeks? ---Yes Does the PT have any chronic conditions? (i.e. diabetes, asthma, this includes High risk factors for pregnancy, etc.) ---Yes List chronic conditions. ---HTN, cholesterol, diabetes, obese, asthma Is this a behavioral health or substance abuse call? ---No Guidelines Guideline Title Affirmed Question Affirmed Notes Nurse Date/Time (Eastern Time) Nosebleed [1] Large amount of blood has been lost (e.g., 1 cup or 240 ml) AND [place], RN, Nicholaus Bloom 01/23/2022 11:46:21 AM PLEASE NOTE: All timestamps contained within this report are represented as [place] Standard Time. CONFIDENTIALTY NOTICE: This fax transmission is intended only for the addressee. It contains information that is legally privileged, confidential or otherwise protected from use or disclosure. If you are not the intended recipient, you are strictly prohibited from reviewing, disclosing, copying using or disseminating any of this information or taking any action in reliance on or regarding this information. If you have received this fax in error, please notify us immediately by telephone so that we can arrange for its return to [place]. Phone: [phone], Toll-Free: [phone], Fax: [phone] Page: 2 of 2 Call Id: [id number] Guidelines Guideline Title Affirmed Question Affirmed Notes Nurse Date/Time Lamount Cohen Time) [2] bleeding now controlled (stopped) Disp. Time Lamount Cohen Time) Disposition Final User 01/23/2022 11:41:10 AM Send to Urgent Mauri Pole 01/23/2022 11:52:38 AM See PCP within 24 Hours Yes Alexander Mt, RN, Nicholaus Bloom Final  Disposition 01/23/2022 11:52:38 AM  See PCP within 24 Hours Yes Alexander Mt, RN, Prentiss Bells Disagree/Comply Comply Caller Understands Yes PreDisposition Call Doctor Care Advice Given Per Guideline SEE PCP WITHIN 24 HOURS: * IF OFFICE WILL BE OPEN: You need to be examined within the next 24 hours. Call your doctor (or NP/PA) when the office opens and make an appointment. PINCH THE NOSTRILS TO STOP A NOSEBLEED: * First gently blow the nose to clear out any large clots. * LEAN FORWARD: Sit down and lean forward. Reason: Blood makes people choke if they lean backwards. [facility] THE NOSE: Gently squeeze the soft parts of the lower nose (nostrils) together. Use your thumb and your index finger in a pinching manner. Do this for 15 minutes. Use a clock or watch to measure the time. Goal: Apply constant pressure to the bleeding point. * If the bleeding continues after 15 minutes of squeezing, move your point of pressure and repeat again for another 15 minutes. NASAL DECONGESTANT SPRAY FOR A NOSEBLEED: * Use either OXYMETAZOLINE (Afrin in [place]; Drixoral in [place]) or PHENYLEPHRINE (Neo-Synephrine). You can get them over-the-counter at the drugstore. CARE ADVICE given per Nosebleed (Adult) guideline. CALL BACK IF: * Nose bleeding lasts longer than 30 minutes with using direct pressure * Lightheadedness or weakness occurs * Nosebleeds become worse * You become worse Referrals REFERRED TO PCP OFFICE

## 2022-01-23 NOTE — Telephone Encounter (Signed)
Noted, will evaluate as scheduled.  

## 2022-01-23 NOTE — Telephone Encounter (Signed)
Patient called in stating that she went to ED, they recommended that if the nose bleeds happen again today to see PCP immediately. Spoke with Park Meo, she said if patient is having an active bleed then to have patient triaged. Asked Kalana if it was currently bleeding, not as heavy but was okay with me having her triaged.  Now waiting on Access Nurse note.

## 2022-01-23 NOTE — Telephone Encounter (Signed)
I am just now seeing this message.  Patient has an appointment scheduled with me for tomorrow.  Will evaluate then.  Awaiting triage report.

## 2022-01-24 ENCOUNTER — Encounter: Payer: Self-pay | Admitting: Primary Care

## 2022-01-24 ENCOUNTER — Ambulatory Visit (INDEPENDENT_AMBULATORY_CARE_PROVIDER_SITE_OTHER): Payer: 59 | Admitting: Primary Care

## 2022-01-24 ENCOUNTER — Other Ambulatory Visit: Payer: Self-pay | Admitting: Primary Care

## 2022-01-24 VITALS — BP 160/88 | HR 75 | Temp 98.2°F | Ht 65.0 in | Wt 242.0 lb

## 2022-01-24 DIAGNOSIS — R040 Epistaxis: Secondary | ICD-10-CM | POA: Diagnosis not present

## 2022-01-24 DIAGNOSIS — I10 Essential (primary) hypertension: Secondary | ICD-10-CM

## 2022-01-24 DIAGNOSIS — J4530 Mild persistent asthma, uncomplicated: Secondary | ICD-10-CM

## 2022-01-24 HISTORY — DX: Epistaxis: R04.0

## 2022-01-24 MED ORDER — BUDESONIDE-FORMOTEROL FUMARATE 160-4.5 MCG/ACT IN AERO: 1.0000 | INHALATION_SPRAY | Freq: Two times a day (BID) | RESPIRATORY_TRACT | 5 refills | Status: DC

## 2022-01-24 NOTE — Progress Notes (Signed)
Subjective:    Patient ID: Amanda Dixon, female    DOB: 27-Mar-1966, 56 y.o.   MRN: [id number]  Epistaxis     Amanda Dixon is a very pleasant 56 y.o. female with a history of chronic migraines, hypertension, exercise induced asthma, GAD, hypersomnia, chronic fatigue, prediabetes who presents today for ED follow up.  She presented to [facility] on 01/22/22 with intermittent right sided epistaxis that began four days prior. During her visit there was no active bleeding on exam. Her BP was noted to be elevated despite compliance to losartan 25 mg. Given her epistaxis, coupled with her elevated BP readings, she was initiated on amlodipine 5 mg daily. She was instructed to follow up with PCP.  Since her ED visit she is checking her BP at home which is running 150's/80's-low 100's. She started her amlodipine 5 mg yesterday. She continues losartan 25 mg daily.   She did experience an episode of bleeding from right nares, lasted about 15 minutes, stopped on its own. She has a history of "sinus issues" that dates back years ago. She denies headaches, dizziness. She is under a tremendous amount of stress.   She has noticed increased asthma symptoms of wheezing and exertional SOB over the last several months. She is not using her Breo inhaler daily because of the cost of $300 per month. She is using her albuterol several times weekly.   BP Readings from Last 3 Encounters:  01/24/22 (!) 160/88  01/22/22 (!) 151/102  01/17/22 (!) 167/79      Review of Systems  HENT:  Positive for nosebleeds.   Eyes:  Negative for visual disturbance.  Respiratory:  Negative for shortness of breath.   Cardiovascular:  Negative for chest pain.  Neurological:  Negative for dizziness and headaches.         Past Medical History:  Diagnosis Date   Asthma    Hypertension    Sinus trouble     Social History   Socioeconomic History   Marital status: Married    Spouse name: Not on file   Number of children:  Not on file   Years of education: Not on file   Highest education level: Not on file  Occupational History   Occupation: Research scientist (medical)  Tobacco Use   Smoking status: Never   Smokeless tobacco: Never   Tobacco comments:    NEVER SMOKER:  EXPOSED TO 2ND(HUSBAND) X SEVERAL YEARS.   Vaping Use   Vaping Use: Never used  Substance and Sexual Activity   Alcohol use: No   Drug use: No   Sexual activity: Yes    Partners: Male    Birth control/protection: Surgical    Comment: Hysterectomy   Other Topics Concern   Not on file  Social History Narrative   Not on file   Social Determinants of Health   Financial Resource Strain: Not on file  Food Insecurity: Not on file  Transportation Needs: Not on file  Physical Activity: Not on file  Stress: Not on file  Social Connections: Not on file  Intimate Partner Violence: Not on file    Past Surgical History:  Procedure Laterality Date   ABDOMINAL HYSTERECTOMY  2008   supracervical   CESAREAN SECTION     CHOLECYSTECTOMY  1996/97?   TONSILLECTOMY      Family History  Problem Relation Age of Onset   Heart disease Maternal Grandfather    Emphysema Maternal Grandfather    Asthma Maternal Grandfather  Heart disease Paternal Grandfather    Heart disease Father        multpile bypass   Emphysema Paternal Grandmother    Diabetes Paternal Grandmother    Allergies Sister    Diabetes Sister    Asthma Sister    Eczema Sister    Allergies Son    Allergies Mother    Allergies Other        mothers side   Asthma Other        father side   Asthma Maternal Aunt    Diabetes Paternal Aunt    Diabetes Paternal Uncle    Breast cancer Neg Hx     Allergies  Allergen Reactions   Other Swelling    Bees/Wasps- redness    Current Outpatient Medications on File Prior to Visit  Medication Sig Dispense Refill   albuterol (VENTOLIN HFA) 108 (90 Base) MCG/ACT inhaler TAKE 2 PUFFS BY MOUTH EVERY 6 HOURS AS NEEDED FOR WHEEZE OR  SHORTNESS OF BREATH 8.5 each 0   amLODipine (NORVASC) 5 MG tablet Take 1 tablet (5 mg total) by mouth daily. 21 tablet 0   busPIRone (BUSPAR) 5 MG tablet TAKE 1 TABLET (5 MG TOTAL) BY MOUTH 2 (TWO) TIMES DAILY FOR ANXIETY. 180 tablet 0   famotidine (PEPCID) 20 MG tablet One at bedtime 30 tablet 11   losartan (COZAAR) 25 MG tablet Take 1 tablet (25 mg total) by mouth daily. for blood pressure. 90 tablet 1   meloxicam (MOBIC) 7.5 MG tablet Take 7.5 mg by mouth 2 (two) times daily.     PARoxetine (PAXIL) 40 MG tablet Take 1 tablet (40 mg total) by mouth every morning. For anxiety 90 tablet 0   rosuvastatin (CRESTOR) 10 MG tablet Take 1 tablet (10 mg total) by mouth daily. For cholesterol. 90 tablet 0   SUMAtriptan (IMITREX) 50 MG tablet TAKE 1 TABLET MY MOUTH AT MIGRAINE ONSET. MAY REPEAT IN 2 HOURS IF HEADACHE PERSISTS OR RECURS. 10 tablet 5   triamcinolone (NASACORT AQ) 55 MCG/ACT nasal inhaler Place 2 sprays into the nose daily. 1 Inhaler 0   No current facility-administered medications on file prior to visit.    BP (!) 160/88   Pulse 75   Temp 98.2 F (36.8 C) (Oral)   Ht 5\' 5"  (1.651 m)   Wt 242 lb (109.8 kg)   SpO2 96%   BMI 40.27 kg/m  Objective:   Physical Exam HENT:     Nose:     Right Nostril: No epistaxis or occlusion.     Left Nostril: No epistaxis or occlusion.     Comments: Scant amount of dried red blood to right nares.  Cardiovascular:     Rate and Rhythm: Normal rate and regular rhythm.  Pulmonary:     Effort: Pulmonary effort is normal.     Breath sounds: Normal breath sounds.  Musculoskeletal:     Cervical back: Neck supple.  Skin:    General: Skin is warm and dry.  Psychiatric:        Mood and Affect: Mood normal.           Assessment & Plan:   Problem List Items Addressed This Visit       Cardiovascular and Mediastinum   Primary hypertension    Continue amlodipine 5 mg daily as she just began this yesterday.  Continue losartan 25 mg daily.    She will send BP readings via MyChart in 1 week. Consider increasing losartan 50 mg if  warranted.  Follow up in 2-3 weeks.        Respiratory   Asthma - Primary    Uncontrolled. Earlie Server is cost prohibitive.  Rx for Symbicort 160-4.5 mcg inhaler sent to pharmacy. 1 puff BID. Continue albuterol PRN.  She will update if Symbicort is too expensive.       Relevant Medications   budesonide-formoterol (SYMBICORT) 160-4.5 MCG/ACT inhaler     Other   Epistaxis    Appears to be secondary to uncontrolled hypertension.  Working to treat hypertension. Follow up in 2-3 weeks.          Doreene Nest, NP

## 2022-01-24 NOTE — Patient Instructions (Signed)
Start budesonide-formoterol (Symbicort) inhaler for asthma. Inhale 1 puff into the lungs twice daily.   Continue amlodipine 5 mg for blood pressure. Continue losartan 25 mg daily for blood pressure.  Send me BP readings in 2 weeks.  Please schedule a follow up visit to meet back with me in 2-3 weeks for blood pressure check.   It was a pleasure to see you today!

## 2022-01-24 NOTE — Assessment & Plan Note (Signed)
Uncontrolled. Earlie Server is cost prohibitive.  Rx for Symbicort 160-4.5 mcg inhaler sent to pharmacy. 1 puff BID. Continue albuterol PRN.  She will update if Symbicort is too expensive.

## 2022-01-24 NOTE — Assessment & Plan Note (Signed)
Appears to be secondary to uncontrolled hypertension.  Working to treat hypertension. Follow up in 2-3 weeks.

## 2022-01-24 NOTE — Assessment & Plan Note (Signed)
Continue amlodipine 5 mg daily as she just began this yesterday.  Continue losartan 25 mg daily.   She will send BP readings via MyChart in 1 week. Consider increasing losartan 50 mg if warranted.  Follow up in 2-3 weeks.

## 2022-02-01 NOTE — Addendum Note (Signed)
Addended by: Doreene Nest on: 02/01/2022 04:33 PM   Modules accepted: Orders

## 2022-02-10 ENCOUNTER — Other Ambulatory Visit: Payer: Self-pay | Admitting: Primary Care

## 2022-02-10 DIAGNOSIS — J45990 Exercise induced bronchospasm: Secondary | ICD-10-CM

## 2022-02-17 ENCOUNTER — Other Ambulatory Visit: Payer: Self-pay | Admitting: Primary Care

## 2022-02-17 DIAGNOSIS — F411 Generalized anxiety disorder: Secondary | ICD-10-CM

## 2022-02-18 ENCOUNTER — Other Ambulatory Visit: Payer: Self-pay | Admitting: Family Medicine

## 2022-02-18 DIAGNOSIS — E785 Hyperlipidemia, unspecified: Secondary | ICD-10-CM

## 2022-02-19 ENCOUNTER — Encounter: Payer: Self-pay | Admitting: Primary Care

## 2022-02-19 ENCOUNTER — Ambulatory Visit (INDEPENDENT_AMBULATORY_CARE_PROVIDER_SITE_OTHER): Payer: 59 | Admitting: Primary Care

## 2022-02-19 DIAGNOSIS — I10 Essential (primary) hypertension: Secondary | ICD-10-CM

## 2022-02-19 MED ORDER — AMLODIPINE BESYLATE 5 MG PO TABS: 5.0000 mg | ORAL_TABLET | Freq: Every day | ORAL | 1 refills | Status: DC

## 2022-02-19 NOTE — Progress Notes (Signed)
Subjective:    Patient ID: Amanda Dixon, female    DOB: 10-29-1965, 56 y.o.   MRN: [id number]  Hypertension Pertinent negatives include no chest pain, headaches or shortness of breath.    Amanda Dixon is a very pleasant 56 y.o. female with a history of migraines, hypertension, asthma, obesity, hyperlipidemia, prediabetes who presents today for follow-up of hypertension.   She was last evaluated on 01/24/2022 for emergency department follow-up for hypertension.  During this visit she endorsed compliance to losartan 25 mg daily, and the amlodipine 5 mg daily which she initiated 1 day prior.  During this visit her blood pressure was elevated.  Given her readings it was recommended she continue her prescribed regimen and follow-up today.  Since her last visit she endorsed compliance to amlodipine 5 mg daily and losartan 25 mg daily.  She has tried to increase her water intake, work on her diet. She denies increased lower extremity swelling. She is checking BP at home which is running 130's-140's/80's. She denies headaches, dizziness.   BP Readings from Last 3 Encounters:  02/19/22 138/78  01/24/22 (!) 160/88  01/22/22 (!) 151/102     Review of Systems  Respiratory:  Negative for shortness of breath.   Cardiovascular:  Negative for chest pain.  Neurological:  Negative for dizziness and headaches.         Past Medical History:  Diagnosis Date   Asthma    Hypertension    Sinus trouble     Social History   Socioeconomic History   Marital status: Married    Spouse name: Not on file   Number of children: Not on file   Years of education: Not on file   Highest education level: Not on file  Occupational History   Occupation: Research scientist (medical)  Tobacco Use   Smoking status: Never   Smokeless tobacco: Never   Tobacco comments:    NEVER SMOKER:  EXPOSED TO 2ND(HUSBAND) X SEVERAL YEARS.   Vaping Use   Vaping Use: Never used  Substance and Sexual Activity   Alcohol  use: No   Drug use: No   Sexual activity: Yes    Partners: Male    Birth control/protection: Surgical    Comment: Hysterectomy   Other Topics Concern   Not on file  Social History Narrative   Not on file   Social Determinants of Health   Financial Resource Strain: Not on file  Food Insecurity: Not on file  Transportation Needs: Not on file  Physical Activity: Not on file  Stress: Not on file  Social Connections: Not on file  Intimate Partner Violence: Not on file    Past Surgical History:  Procedure Laterality Date   ABDOMINAL HYSTERECTOMY  2008   supracervical   CESAREAN SECTION     CHOLECYSTECTOMY  1996/97?   TONSILLECTOMY      Family History  Problem Relation Age of Onset   Allergies Mother    Heart disease Father        multpile bypass   Allergies Sister    Diabetes Sister    Asthma Sister    Eczema Sister    Cancer - Cervical Sister    Endometriosis Sister    Heart disease Maternal Grandfather    Emphysema Maternal Grandfather    Asthma Maternal Grandfather    Emphysema Paternal Grandmother    Diabetes Paternal Grandmother    Heart disease Paternal Grandfather    Allergies Son    Asthma Maternal Aunt  Diabetes Paternal Aunt    Diabetes Paternal Uncle    Allergies Other        mothers side   Asthma Other        father side   Breast cancer Neg Hx     Allergies  Allergen Reactions   Other Swelling    Bees/Wasps- redness    Current Outpatient Medications on File Prior to Visit  Medication Sig Dispense Refill   albuterol (VENTOLIN HFA) 108 (90 Base) MCG/ACT inhaler TAKE 2 PUFFS BY MOUTH EVERY 6 HOURS AS NEEDED FOR WHEEZE OR SHORTNESS OF BREATH 8.5 each 0   busPIRone (BUSPAR) 5 MG tablet TAKE 1 TABLET (5 MG TOTAL) BY MOUTH 2 (TWO) TIMES DAILY FOR ANXIETY. 180 tablet 0   famotidine (PEPCID) 20 MG tablet One at bedtime 30 tablet 11   losartan (COZAAR) 25 MG tablet Take 1 tablet (25 mg total) by mouth daily. for blood pressure. 90 tablet 1    mometasone-formoterol (DULERA) 100-5 MCG/ACT AERO Inhale 2 puffs into the lungs 2 (two) times daily. 13 g 5   PARoxetine (PAXIL) 40 MG tablet TAKE 1 TABLET (40 MG TOTAL) BY MOUTH EVERY MORNING. FOR ANXIETY 90 tablet 2   rosuvastatin (CRESTOR) 10 MG tablet TAKE 1 TABLET BY MOUTH DAILY. FOR CHOLESTEROL 90 tablet 1   SUMAtriptan (IMITREX) 50 MG tablet TAKE 1 TABLET MY MOUTH AT MIGRAINE ONSET. MAY REPEAT IN 2 HOURS IF HEADACHE PERSISTS OR RECURS. 10 tablet 5   triamcinolone (NASACORT AQ) 55 MCG/ACT nasal inhaler Place 2 sprays into the nose daily. 1 Inhaler 0   meloxicam (MOBIC) 7.5 MG tablet Take 7.5 mg by mouth 2 (two) times daily. (Patient not taking: Reported on 02/19/2022)     No current facility-administered medications on file prior to visit.    BP 138/78   Pulse 74   Temp 98 F (36.7 C) (Oral)   Ht 5\' 5"  (1.651 m)   Wt 245 lb (111.1 kg)   SpO2 97%   BMI 40.77 kg/m  Objective:   Physical Exam Cardiovascular:     Rate and Rhythm: Normal rate and regular rhythm.  Pulmonary:     Effort: Pulmonary effort is normal.     Breath sounds: Normal breath sounds.  Musculoskeletal:     Cervical back: Neck supple.  Skin:    General: Skin is warm and dry.           Assessment & Plan:   Problem List Items Addressed This Visit       Cardiovascular and Mediastinum   Primary hypertension    Improved!  She is motivated to work on lifestyle changes to further lower her BP. Continue losartan 25 mg daily, amlodipine 5 mg daily.  Continue to monitor.       Relevant Medications   amLODipine (NORVASC) 5 MG tablet       , NP

## 2022-02-19 NOTE — Patient Instructions (Addendum)
Keep working on your diet to lower your blood pressure.   Continue taking amlodipine 5 mg daily and losartan 25 mg daily.  Please schedule a physical to meet with me in late December.  It was a pleasure to see you today!

## 2022-02-19 NOTE — Assessment & Plan Note (Signed)
Improved!  She is motivated to work on lifestyle changes to further lower her BP. Continue losartan 25 mg daily, amlodipine 5 mg daily.  Continue to monitor.

## 2022-02-26 ENCOUNTER — Ambulatory Visit (INDEPENDENT_AMBULATORY_CARE_PROVIDER_SITE_OTHER): Payer: 59 | Admitting: Behavioral Health

## 2022-02-26 DIAGNOSIS — F4321 Adjustment disorder with depressed mood: Secondary | ICD-10-CM | POA: Diagnosis not present

## 2022-02-26 DIAGNOSIS — F419 Anxiety disorder, unspecified: Secondary | ICD-10-CM

## 2022-03-05 ENCOUNTER — Ambulatory Visit: Payer: 59

## 2022-03-05 DIAGNOSIS — G4733 Obstructive sleep apnea (adult) (pediatric): Secondary | ICD-10-CM | POA: Diagnosis not present

## 2022-03-05 DIAGNOSIS — R0683 Snoring: Secondary | ICD-10-CM

## 2022-03-08 DIAGNOSIS — G4733 Obstructive sleep apnea (adult) (pediatric): Secondary | ICD-10-CM

## 2022-03-11 ENCOUNTER — Institutional Professional Consult (permissible substitution): Payer: 59 | Admitting: Primary Care

## 2022-03-20 NOTE — Progress Notes (Signed)
                Teighlor Korson L Kingstyn Deruiter, LMFT 

## 2022-03-20 NOTE — Progress Notes (Signed)
[facility] Counselor Initial Adult Exam  Name: Amanda Dixon Date: 03/20/2022 MRN: [id number] DOB: 01-31-1966 PCP: Doreene Nest, NP  Time spent: 60 min In Person visit @  [facility] w/Clinician  Guardian/Payee:  Self    Paperwork requested: No   Reason for Visit /Presenting Problem: elevated anx/dep & adjustment reaction to recent Family deaths & health status changes in the Family that are concerning  Mental Status Exam: Appearance:   Neat     Behavior:  Appropriate  Motor:  Normal  Speech/Language:   Normal Rate  Affect:  Appropriate  Mood:  normal  Thought process:  normal  Thought content:    WNL  Sensory/Perceptual disturbances:    WNL  Orientation:  oriented to person, place, and time/date  Attention:  Good  Concentration:  Good  Memory:  WNL  Fund of knowledge:   Good  Insight:    Good  Judgment:   Good  Impulse Control:  Good   Reported Symptoms:  elevated anx/dep & grief rxn, work stressors, weight & sleep concerns  Risk Assessment: Danger to Self:  No Self-injurious Behavior: No Danger to Others: No Duty to Warn:no Physical Aggression / Violence:No  Access to Firearms a concern: No  Gang Involvement:No  Patient / guardian was educated about steps to take if suicide or homicide risk level increases between visits: no While future psychiatric events cannot be accurately predicted, the patient does not currently require acute inpatient psychiatric care and does not currently meet [place] involuntary commitment criteria.  Substance Abuse History: Current substance abuse: No     Past Psychiatric History:   No previous psychological problems have been observed Outpatient Providers: Mayra Reel, PA History of Psych Hospitalization: No  Psychological Testing:  NA    Abuse History:  Victim of: No.,  NA    Report needed: No. Victim of Neglect:No. Perpetrator of  NA   Witness / Exposure to Domestic Violence: No   Protective  Services Involvement: No  Witness to [employer] Violence:  No   Family History:  Family History  Problem Relation Age of Onset   Allergies Mother    Heart disease Father        multpile bypass   Allergies Sister    Diabetes Sister    Asthma Sister    Eczema Sister    Cancer - Cervical Sister    Endometriosis Sister    Heart disease Maternal Grandfather    Emphysema Maternal Grandfather    Asthma Maternal Grandfather    Emphysema Paternal Grandmother    Diabetes Paternal Grandmother    Heart disease Paternal Grandfather    Allergies Son    Asthma Maternal Aunt    Diabetes Paternal Aunt    Diabetes Paternal Uncle    Allergies Other        mothers side   Asthma Other        father side   Breast cancer Neg Hx     Living situation: the patient lives with their spouse  Sexual Orientation: Straight  Relationship Status: married  Name of spouse / other: Kathlene November If a parent, number of children / ages: 17yo Son Landscape architect  Support Systems: spouse  Surveyor, quantity Stress:  No   Income/Employment/Disability: Employment  Financial planner: No   Educational History: 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: [employer]  Any cultural differences that may affect / interfere with treatment:  Not noted today  Recreation/Hobbies: Pt enjoys walking  Stressors: Health problems  Loss of  Fatther last week   Other: Life is chaotic due to Family struggles w/deaths & related financial issues caused by recklessness    Strengths: Supportive Relationships, Family, Friends, Journalist, newspaper, and Able to Communicate Effectively  Barriers:  Pt sts she, "does not want a cheerleader, just someone objective to listen & provide feedback"   Legal History: Pending legal issue / charges: The patient has no significant history of legal issues. History of legal issue / charges:  NA  Medical History/Surgical History: reviewed Past Medical History:  Diagnosis Date   Asthma    Hypertension     Sinus trouble     Past Surgical History:  Procedure Laterality Date   ABDOMINAL HYSTERECTOMY  2008   supracervical   CESAREAN SECTION     CHOLECYSTECTOMY  1996/97?   TONSILLECTOMY      Medications: Current Outpatient Medications  Medication Sig Dispense Refill   albuterol (VENTOLIN HFA) 108 (90 Base) MCG/ACT inhaler TAKE 2 PUFFS BY MOUTH EVERY 6 HOURS AS NEEDED FOR WHEEZE OR SHORTNESS OF BREATH 8.5 each 0   amLODipine (NORVASC) 5 MG tablet Take 1 tablet (5 mg total) by mouth daily. for blood pressure. 90 tablet 1   busPIRone (BUSPAR) 5 MG tablet TAKE 1 TABLET (5 MG TOTAL) BY MOUTH 2 (TWO) TIMES DAILY FOR ANXIETY. 180 tablet 0   famotidine (PEPCID) 20 MG tablet One at bedtime 30 tablet 11   losartan (COZAAR) 25 MG tablet Take 1 tablet (25 mg total) by mouth daily. for blood pressure. 90 tablet 1   meloxicam (MOBIC) 7.5 MG tablet Take 7.5 mg by mouth 2 (two) times daily. (Patient not taking: Reported on 02/19/2022)     mometasone-formoterol (DULERA) 100-5 MCG/ACT AERO Inhale 2 puffs into the lungs 2 (two) times daily. 13 g 5   PARoxetine (PAXIL) 40 MG tablet TAKE 1 TABLET (40 MG TOTAL) BY MOUTH EVERY MORNING. FOR ANXIETY 90 tablet 2   rosuvastatin (CRESTOR) 10 MG tablet TAKE 1 TABLET BY MOUTH DAILY. FOR CHOLESTEROL 90 tablet 1   SUMAtriptan (IMITREX) 50 MG tablet TAKE 1 TABLET MY MOUTH AT MIGRAINE ONSET. MAY REPEAT IN 2 HOURS IF HEADACHE PERSISTS OR RECURS. 10 tablet 5   triamcinolone (NASACORT AQ) 55 MCG/ACT nasal inhaler Place 2 sprays into the nose daily. 1 Inhaler 0   No current facility-administered medications for this visit.    Allergies  Allergen Reactions   Other Swelling    Bees/Wasps- redness    Diagnoses:  Anxiety Grief Reaction  Plan of Care: Pt will begin to wak w/her Herbert Moors as often as they can. Pt will attend to her eating habits & reduce incidents of eating emot'ly. Pt will maintain healthy boundaries w/her Family as they attempt to manage the inheritances  of elders & traverse health status changes that make these issues complex.    Deneise Lever, LMFT

## 2022-03-22 ENCOUNTER — Encounter: Payer: Self-pay | Admitting: Primary Care

## 2022-03-22 ENCOUNTER — Ambulatory Visit (INDEPENDENT_AMBULATORY_CARE_PROVIDER_SITE_OTHER): Payer: 59 | Admitting: Primary Care

## 2022-03-22 VITALS — BP 116/70 | HR 78 | Temp 98.0°F | Ht 65.0 in | Wt 246.0 lb

## 2022-03-22 DIAGNOSIS — R0683 Snoring: Secondary | ICD-10-CM | POA: Diagnosis not present

## 2022-03-22 DIAGNOSIS — G4730 Sleep apnea, unspecified: Secondary | ICD-10-CM | POA: Diagnosis not present

## 2022-03-22 NOTE — Assessment & Plan Note (Signed)
-   Patient has symptoms of snoring, restless sleep and daytime sleepiness.  Epworth score is 18.  Home sleep study on 03/05/2022 showed severe obstructive sleep apnea, AHI 32.7 an hour.  We discussed treatment options including weight loss, oral appliance, CPAP therapy and referral to ENT for possible surgical options.  Due to severity of her sleep apnea recommending patient be started on CPAP first. She is not likely candidate for oral appliance since she will be getting braces soon. She is anxious about starting CPAP, however, she is willing to try.  We will place an order for patient to be started on auto CPAP 5 to 15 cm H2O with mask of choice.  She is motivated to lose weight.  Advised against driving if experiencing excessive daytime sleepiness or fatigue.  Follow-up 31 to 90 days after starting CPAP for compliance check.

## 2022-03-22 NOTE — Progress Notes (Signed)
@Patient  ID: , female    DOB: 05-13-1966, 56 y.o.   MRN: [id number]  Chief Complaint  Patient presents with   Follow-up    Sleep study results    Referring provider: [id number], NP  HPI: 56 year old female, never smoked.  Past medical history significant for hypertension, chronic migraine, venous insufficiency of both lower extremities, mild exercise-induced asthma, chronic fatigue, hyperlipidemia, hypersomnia, obesity.  Previous [facility] pulmonary encounter:  01/07/2022 Patient presents today for sleep consult.  Patient has symptoms of daytime sleepiness, loud snoring, restless sleep.  She has woken herself up gasping for air.  She is constantly tired. She has a lot of stress at home caring for family members and attributes her fatigue to this.  Her PCP recently increased Paxil and added BuSpar.  She has not been able to exercise since a back injury several months ago.  She has been attending physical therapy.  She was recently released to resume exercise. She has gained 30-40 lbs in the last year.  Typical bedtime is 10 PM.  It does not long for her to fall asleep.  She will wake up on average 2-4 times a night.  She starts her day at 6 AM.  Previa sleep studies.  She is not on CPAP or oxygen.  Epworth score 18.  Sleep questionnaire Symptoms- Louds snoring, wakes up choking, Daytime sleepiness, restless sleep  Prior sleep study- None Bedtime- 10pm Time to fall asleep- <15 mins  Nocturnal awakenings- 2-4 times Out of bed/start of day- 6am Weight changes- up 30 lbs Do you operate heavy machinery- No Do you currently wear CPAP- No Do you current wear oxygen- No Epworth- 18   03/22/2022 Patient presents today to review sleep study results.  She had a home sleep study on 03/05/2022 that showed severe obstructive sleep apnea, AHI 32.7 an hour with SPO2 low 82% (93% average).  We discussed treatment options including weight loss, oral appliance, CPAP therapy referral to ENT  for possible surgical options.  She is anxious to start CPAP but is willing to try.  She fears she will not be able to tolerate and that her partner will be disrupted by the noise from her CPAP machine.  She will be getting braces shortly so oral appliance likely not an option at this time.  She is wanting to work on weight loss, cardiology recommended she be started on Ozempic and she will discuss this with her PCP.   Allergies  Allergen Reactions   Other Swelling    Bees/Wasps- redness    Immunization History  Administered Date(s) Administered   Influenza Split 04/28/2012   Janssen (J&J) SARS-COV-2 Vaccination 04/24/2020   Tdap 01/25/2019    Past Medical History:  Diagnosis Date   Asthma    Hypertension    Sinus trouble     Tobacco History: Social History   Tobacco Use  Smoking Status Never   Passive exposure: Past  Smokeless Tobacco Never  Tobacco Comments   NEVER SMOKER:  EXPOSED TO 2ND(HUSBAND) X SEVERAL YEARS.    Counseling given: Not Answered Tobacco comments: NEVER SMOKER:  EXPOSED TO 2ND(HUSBAND) X SEVERAL YEARS.    Outpatient Medications Prior to Visit  Medication Sig Dispense Refill   albuterol (VENTOLIN HFA) 108 (90 Base) MCG/ACT inhaler TAKE 2 PUFFS BY MOUTH EVERY 6 HOURS AS NEEDED FOR WHEEZE OR SHORTNESS OF BREATH 8.5 each 0   amLODipine (NORVASC) 5 MG tablet Take 1 tablet (5 mg total) by mouth daily. for blood  pressure. 90 tablet 1   busPIRone (BUSPAR) 5 MG tablet TAKE 1 TABLET (5 MG TOTAL) BY MOUTH 2 (TWO) TIMES DAILY FOR ANXIETY. 180 tablet 0   famotidine (PEPCID) 20 MG tablet One at bedtime 30 tablet 11   losartan (COZAAR) 25 MG tablet Take 1 tablet (25 mg total) by mouth daily. for blood pressure. 90 tablet 1   meloxicam (MOBIC) 7.5 MG tablet Take 7.5 mg by mouth 2 (two) times daily.     mometasone-formoterol (DULERA) 100-5 MCG/ACT AERO Inhale 2 puffs into the lungs 2 (two) times daily. 13 g 5   PARoxetine (PAXIL) 40 MG tablet TAKE 1 TABLET (40 MG  TOTAL) BY MOUTH EVERY MORNING. FOR ANXIETY 90 tablet 2   rosuvastatin (CRESTOR) 10 MG tablet TAKE 1 TABLET BY MOUTH DAILY. FOR CHOLESTEROL 90 tablet 1   SUMAtriptan (IMITREX) 50 MG tablet TAKE 1 TABLET MY MOUTH AT MIGRAINE ONSET. MAY REPEAT IN 2 HOURS IF HEADACHE PERSISTS OR RECURS. 10 tablet 5   triamcinolone (NASACORT AQ) 55 MCG/ACT nasal inhaler Place 2 sprays into the nose daily. 1 Inhaler 0   No facility-administered medications prior to visit.   Review of Systems  Review of Systems  Constitutional: Negative.   Respiratory: Negative.    Cardiovascular: Negative.    Physical Exam  BP 116/70 (BP Location: Left Arm, Patient Position: Sitting, Cuff Size: Large)   Pulse 78   Temp 98 F (36.7 C) (Oral)   Ht 5\' 5"  (1.651 m)   Wt 246 lb (111.6 kg)   SpO2 98%   BMI 40.94 kg/m  Physical Exam Constitutional:      Appearance: Normal appearance.  Cardiovascular:     Rate and Rhythm: Normal rate.  Pulmonary:     Effort: Pulmonary effort is normal.  Neurological:     General: No focal deficit present.     Mental Status: She is alert and oriented to person, place, and time. Mental status is at baseline.  Psychiatric:        Mood and Affect: Mood normal.        Behavior: Behavior normal.        Thought Content: Thought content normal.        Judgment: Judgment normal.      Lab Results:  CBC    Component Value Date/Time   WBC 11.1 (H) 08/06/2021 1746   RBC 4.42 08/06/2021 1746   HGB 13.6 08/06/2021 1746   HCT 41.1 08/06/2021 1746   PLT 321 08/06/2021 1746   MCV 93.0 08/06/2021 1746   MCH 30.8 08/06/2021 1746   MCHC 33.1 08/06/2021 1746   RDW 13.8 08/06/2021 1746   LYMPHSABS 4.0 12/19/2016 1654   MONOABS 0.7 12/19/2016 1654   EOSABS 0.2 12/19/2016 1654   BASOSABS 0.2 (H) 12/19/2016 1654    BMET    Component Value Date/Time   NA 140 08/21/2021 0832   NA 138 01/27/2019 1204   K 4.2 08/21/2021 0832   CL 104 08/21/2021 0832   CO2 31 08/21/2021 0832   GLUCOSE 91  08/21/2021 0832   BUN 10 08/21/2021 0832   BUN 9 01/27/2019 1204   CREATININE 0.66 08/21/2021 0832   CALCIUM 9.1 08/21/2021 0832   GFRNONAA >60 08/06/2021 1746   GFRAA 100 01/27/2019 1204    BNP No results found for: "BNP"  ProBNP    Component Value Date/Time   PROBNP 33.0 01/25/2020 1455    Imaging: No results found.   Assessment & Plan:   Severe sleep  apnea - Patient has symptoms of snoring, restless sleep and daytime sleepiness.  Epworth score is 18.  Home sleep study on 03/05/2022 showed severe obstructive sleep apnea, AHI 32.7 an hour.  We discussed treatment options including weight loss, oral appliance, CPAP therapy and referral to ENT for possible surgical options.  Due to severity of her sleep apnea recommending patient be started on CPAP first. She is not likely candidate for oral appliance since she will be getting braces soon. She is anxious about starting CPAP, however, she is willing to try.  We will place an order for patient to be started on auto CPAP 5 to 15 cm H2O with mask of choice.  She is motivated to lose weight.  Advised against driving if experiencing excessive daytime sleepiness or fatigue.  Follow-up 31 to 90 days after starting CPAP for compliance check.  Obesity, Class III, BMI 40-49.9 (morbid obesity) ([place]) - Patient is motivated to lose weight, however, she is finding it difficult.  She was referred to [facility] during her last office visit but they currently have  long wait to get in as a new patient.  She is interested in trying weight loss medication such as Ozempic.  Advised patient discuss options with PCP.     Martyn Ehrich, NP 03/22/2022

## 2022-03-22 NOTE — Assessment & Plan Note (Addendum)
-   Patient is motivated to lose weight, however, she is finding it difficult.  She was referred to [facility] during her last office visit but they currently have  long wait to get in as a new patient.  She is interested in trying weight loss medication such as Ozempic.  Advised patient discuss options with PCP.

## 2022-03-22 NOTE — Patient Instructions (Signed)
Home sleep study showed evidence of severe obstructive sleep apnea, on average you had 32 apneic/hypopneic events an hour  Treated sleep apnea puts you at high risk for cardiac arrhythmias, pulmonary hypertension, stroke and diabetes  Treatment options include weight loss, oral appliance, CPAP therapy or referral to ENT for possible surgical options  You likely would not be a candidate for oral appliance if you will be getting braces shortly  Due to severity of your sleep apnea I do recommend you be started on CPAP first, if you do not tolerate this we can consider ear nose and throat for surgical evaluation  Encourage weight loss efforts  When she receives CPAP aim to wear every night for minimum 4 to 6 hours or longer  Orders Auto CPAP 5 to 15 cm H2O with mask of choice  Follow-up 31 to 90 days after CPAP start for compliance check (Please call to schedule once you receive your machine)  CPAP and BIPAP Information CPAP and BIPAP are methods that use air pressure to keep your airways open and to help you breathe well. CPAP and BIPAP use different amounts of pressure. Your health care provider will tell you whether CPAP or BIPAP would be more helpful for you. CPAP stands for "continuous positive airway pressure." With CPAP, the amount of pressure stays the same while you breathe in (inhale) and out (exhale). BIPAP stands for "bi-level positive airway pressure." With BIPAP, the amount of pressure will be higher when you inhale and lower when you exhale. This allows you to take larger breaths. CPAP or BIPAP may be used in the hospital, or your health care provider may want you to use it at home. You may need to have a sleep study before your health care provider can order a machine for you to use at home. What are the advantages? CPAP or BIPAP can be helpful if you have: Sleep apnea. Chronic obstructive pulmonary disease (COPD). Heart failure. Medical conditions that cause muscle  weakness, including muscular dystrophy or amyotrophic lateral sclerosis (ALS). Other problems that cause breathing to be shallow, weak, abnormal, or difficult. CPAP and BIPAP are most commonly used for obstructive sleep apnea (OSA) to keep the airways from collapsing when the muscles relax during sleep. What are the risks? Generally, this is a safe treatment. However, problems may occur, including: Irritated skin or skin sores if the mask does not fit properly. Dry or stuffy nose or nosebleeds. Dry mouth. Feeling gassy or bloated. Sinus or lung infection if the equipment is not cleaned properly. When should CPAP or BIPAP be used? In most cases, the mask only needs to be worn during sleep. Generally, the mask needs to be worn throughout the night and during any daytime naps. People with certain medical conditions may also need to wear the mask at other times, such as when they are awake. Follow instructions from your health care provider about when to use the machine. What happens during CPAP or BIPAP?  Both CPAP and BIPAP are provided by a small machine with a flexible plastic tube that attaches to a plastic mask that you wear. Air is blown through the mask into your nose or mouth. The amount of pressure that is used to blow the air can be adjusted on the machine. Your health care provider will set the pressure setting and help you find the best mask for you. Tips for using the mask Because the mask needs to be snug, some people feel trapped or closed-in (claustrophobic) when  first using the mask. If you feel this way, you may need to get used to the mask. One way to do this is to hold the mask loosely over your nose or mouth and then gradually apply the mask more snugly. You can also gradually increase the amount of time that you use the mask. Masks are available in various types and sizes. If your mask does not fit well, talk with your health care provider about getting a different one. Some  common types of masks include: Full face masks, which fit over the mouth and nose. Nasal masks, which fit over the nose. Nasal pillow or prong masks, which fit into the nostrils. If you are using a mask that fits over your nose and you tend to breathe through your mouth, a chin strap may be applied to help keep your mouth closed. Use a skin barrier to protect your skin as told by your health care provider. Some CPAP and BIPAP machines have alarms that may sound if the mask comes off or develops a leak. If you have trouble with the mask, it is very important that you talk with your health care provider about finding a way to make the mask easier to tolerate. Do not stop using the mask. There could be a negative impact on your health if you stop using the mask. Tips for using the machine Place your CPAP or BIPAP machine on a secure table or stand near an electrical outlet. Know where the on/off switch is on the machine. Follow instructions from your health care provider about how to set the pressure on your machine and when you should use it. Do not eat or drink while the CPAP or BIPAP machine is on. Food or fluids could get pushed into your lungs by the pressure of the CPAP or BIPAP. For home use, CPAP and BIPAP machines can be rented or purchased through home health care companies. Many different brands of machines are available. Renting a machine before purchasing may help you find out which particular machine works well for you. Your health insurance company may also decide which machine you may get. Keep the CPAP or BIPAP machine and attachments clean. Ask your health care provider for specific instructions. Check the humidifier if you have a dry stuffy nose or nosebleeds. Make sure it is working correctly. Follow these instructions at home: Take over-the-counter and prescription medicines only as told by your health care provider. Ask if you can take sinus medicine if your sinuses are  blocked. Do not use any products that contain nicotine or tobacco. These products include cigarettes, chewing tobacco, and vaping devices, such as e-cigarettes. If you need help quitting, ask your health care provider. Keep all follow-up visits. This is important. Contact a health care provider if: You have redness or pressure sores on your head, face, mouth, or nose from the mask or head gear. You have trouble using the CPAP or BIPAP machine. You cannot tolerate wearing the CPAP or BIPAP mask. Someone tells you that you snore even when wearing your CPAP or BIPAP. Get help right away if: You have trouble breathing. You feel confused. Summary CPAP and BIPAP are methods that use air pressure to keep your airways open and to help you breathe well. If you have trouble with the mask, it is very important that you talk with your health care provider about finding a way to make the mask easier to tolerate. Do not stop using the mask. There could be  a negative impact to your health if you stop using the mask. Follow instructions from your health care provider about when to use the machine. This information is not intended to replace advice given to you by your health care provider. Make sure you discuss any questions you have with your health care provider. Document Revised: 02/21/2021 Document Reviewed: 06/23/2020 Elsevier Patient Education  2023 [employer].

## 2022-03-23 NOTE — Progress Notes (Signed)
Reviewed and agree with assessment/plan.   Menno Vanbergen, MD [facility] 03/23/2022, 2:23 PM Pager:  [phone]  

## 2022-04-18 ENCOUNTER — Ambulatory Visit (INDEPENDENT_AMBULATORY_CARE_PROVIDER_SITE_OTHER): Payer: 59 | Admitting: Primary Care

## 2022-04-18 ENCOUNTER — Encounter: Payer: Self-pay | Admitting: Primary Care

## 2022-04-18 VITALS — BP 127/82 | HR 88 | Temp 97.3°F | Ht 65.0 in | Wt 249.0 lb

## 2022-04-18 DIAGNOSIS — R7303 Prediabetes: Secondary | ICD-10-CM

## 2022-04-18 MED ORDER — WEGOVY 0.25 MG/0.5ML ~~LOC~~ SOAJ: 0.2500 mg | SUBCUTANEOUS | 0 refills | Status: DC

## 2022-04-18 NOTE — Assessment & Plan Note (Addendum)
Long discussion today regarding weight loss options, her current diet, and her current lack of physical activity. We discussed that the GLP 1 antagonist medications will curb her appetite, but that she needs to make lifestyle changes in general.  We discussed common side effects of GLP 1 antagonists, she verbalized understanding.    Rx for Wegovy 0.5 mg sent to pharmacy, start with 0.25 mg weekly x 4 weeks, then increase to 0.5 mg weekly thereafter.  We will plan to see her back in 2 months for follow up. She agrees.

## 2022-04-18 NOTE — Patient Instructions (Signed)
Start Wegovy injections for weight loss.   Inject 0.25 mg into the skin once weekly for 4 weeks. Please message me on MyChart once you use your last pen of the 0.25 mg dose.   Cut out take out food during lunch.  Increase physical activity.   Schedule a follow up visit for 2 months once you start the medication.   It was a pleasure to see you today!

## 2022-04-18 NOTE — Progress Notes (Signed)
Subjective:    Patient ID: Amanda Dixon, female    DOB: [birth year], 56 y.o.   MRN: [id number]  HPI  Amanda Dixon is a very pleasant 56 y.o. female with a history of morbid obesity, hypersomnia, hyperlipidemia, chronic fatigue, prediabetes, sleep apnea, hypertension who presents today to discuss obesity.  She contacted [place] via [facility] in late August 2023 requesting treatment for weight loss with Ozempic.  She was told by her cardiologist that she would be a good candidate.  Given her request we asked her to come into the office for evaluation and discussion.  She has been obese since 2008, but she's been struggling with weight loss over the last few years. Her goal weight is 180 pounds. Weight has fluctuated over the years between high 100's, but over the last three years she's struggled with weight loss, has been stuck in the low to mid 200's.   She's tried numerous diet plans and attempts for weight loss including weight watchers, exercising, nutrisystem, bariatric clinic with phentermine, calorie counting, [employer]. She's seen temporary improvement but has regained her weight.  Diet currently consists of:  Breakfast: Protein shake, Bagel  Lunch: Take out food Dinner: Home cooked meals, meat, potatoes, veggies (corn, carrots, brussels sprouts),  Snacks: Rarely, veggies, cheese and crackers Desserts: On occasion  Beverages: Soda mostly, water, coffee   Exercise: No regular exercise   Wt Readings from Last 3 Encounters:  04/18/22 249 lb (112.9 kg)  03/22/22 246 lb (111.6 kg)  02/19/22 245 lb (111.1 kg)     Review of Systems  Constitutional:  Positive for fatigue.  Respiratory:  Negative for shortness of breath.   Cardiovascular:  Negative for chest pain.  Gastrointestinal:  Negative for abdominal pain.  Neurological:  Negative for headaches.         Past Medical History:  Diagnosis Date   Asthma    Hypertension    Sinus trouble     Social History    Socioeconomic History   Marital status: Married    Spouse name: Not on file   Number of children: Not on file   Years of education: Not on file   Highest education level: Not on file  Occupational History   Occupation: Scientist, water quality  Tobacco Use   Smoking status: Never    Passive exposure: Past   Smokeless tobacco: Never   Tobacco comments:    NEVER SMOKER:  EXPOSED TO 2ND(HUSBAND) X SEVERAL YEARS.   Vaping Use   Vaping Use: Never used  Substance and Sexual Activity   Alcohol use: No   Drug use: No   Sexual activity: Yes    Partners: Male    Birth control/protection: Surgical    Comment: Hysterectomy   Other Topics Concern   Not on file  Social History Narrative   Not on file   Social Determinants of Health   Financial Resource Strain: Not on file  Food Insecurity: Not on file  Transportation Needs: Not on file  Physical Activity: Not on file  Stress: Not on file  Social Connections: Not on file  Intimate Partner Violence: Not on file    Past Surgical History:  Procedure Laterality Date   ABDOMINAL HYSTERECTOMY  2008   supracervical   CESAREAN SECTION     CHOLECYSTECTOMY  1996/97?   TONSILLECTOMY      Family History  Problem Relation Age of Onset   Allergies Mother    Heart disease Father  multpile bypass   Allergies Sister    Diabetes Sister    Asthma Sister    Eczema Sister    Cancer - Cervical Sister    Endometriosis Sister    Heart disease Maternal Grandfather    Emphysema Maternal Grandfather    Asthma Maternal Grandfather    Emphysema Paternal Grandmother    Diabetes Paternal Grandmother    Heart disease Paternal Grandfather    Allergies Son    Asthma Maternal Aunt    Diabetes Paternal Aunt    Diabetes Paternal Uncle    Allergies Other        mothers side   Asthma Other        father side   Breast cancer Neg Hx     Allergies  Allergen Reactions   Other Swelling    Bees/Wasps- redness    Current Outpatient  Medications on File Prior to Visit  Medication Sig Dispense Refill   albuterol (VENTOLIN HFA) 108 (90 Base) MCG/ACT inhaler TAKE 2 PUFFS BY MOUTH EVERY 6 HOURS AS NEEDED FOR WHEEZE OR SHORTNESS OF BREATH 8.5 each 0   amLODipine (NORVASC) 5 MG tablet Take 1 tablet (5 mg total) by mouth daily. for blood pressure. 90 tablet 1   famotidine (PEPCID) 20 MG tablet One at bedtime 30 tablet 11   losartan (COZAAR) 25 MG tablet Take 1 tablet (25 mg total) by mouth daily. for blood pressure. 90 tablet 1   mometasone-formoterol (DULERA) 100-5 MCG/ACT AERO Inhale 2 puffs into the lungs 2 (two) times daily. 13 g 5   PARoxetine (PAXIL) 40 MG tablet TAKE 1 TABLET (40 MG TOTAL) BY MOUTH EVERY MORNING. FOR ANXIETY 90 tablet 2   rosuvastatin (CRESTOR) 10 MG tablet TAKE 1 TABLET BY MOUTH DAILY. FOR CHOLESTEROL 90 tablet 1   SUMAtriptan (IMITREX) 50 MG tablet TAKE 1 TABLET MY MOUTH AT MIGRAINE ONSET. MAY REPEAT IN 2 HOURS IF HEADACHE PERSISTS OR RECURS. 10 tablet 5   triamcinolone (NASACORT AQ) 55 MCG/ACT nasal inhaler Place 2 sprays into the nose daily. 1 Inhaler 0   busPIRone (BUSPAR) 5 MG tablet TAKE 1 TABLET (5 MG TOTAL) BY MOUTH 2 (TWO) TIMES DAILY FOR ANXIETY. (Patient not taking: Reported on 04/18/2022) 180 tablet 0   meloxicam (MOBIC) 7.5 MG tablet Take 7.5 mg by mouth 2 (two) times daily. (Patient not taking: Reported on 04/18/2022)     No current facility-administered medications on file prior to visit.    BP 127/82   Pulse 88   Temp (!) 97.3 F (36.3 C) (Oral)   Ht 5\' 5"  (1.651 m)   Wt 249 lb (112.9 kg)   SpO2 98%   BMI 41.44 kg/m  Objective:   Physical Exam Cardiovascular:     Rate and Rhythm: Normal rate and regular rhythm.  Pulmonary:     Effort: Pulmonary effort is normal.     Breath sounds: Normal breath sounds.  Musculoskeletal:     Cervical back: Neck supple.  Skin:    General: Skin is warm and dry.           Assessment & Plan:   Problem List Items Addressed This Visit        Other   Obesity, Class III, BMI 40-49.9 (morbid obesity) ([place])    Long discussion today regarding weight loss options, her current diet, and her current lack of physical activity. We discussed that the GLP 1 antagonist medications will curb her appetite, but that she needs to make lifestyle changes in  general.  We discussed common side effects of GLP 1 antagonists, she verbalized understanding.    Rx for Wegovy 0.5 mg sent to pharmacy, start with 0.25 mg weekly x 4 weeks, then increase to 0.5 mg weekly thereafter.  We will plan to see her back in 2 months for follow up. She agrees.       Relevant Medications   Semaglutide-Weight Management (WEGOVY) 0.25 MG/0.5ML SOAJ   Prediabetes - Primary   Relevant Medications   Semaglutide-Weight Management (WEGOVY) 0.25 MG/0.5ML SOAJ       Pleas Koch, NP

## 2022-04-19 ENCOUNTER — Telehealth: Payer: Self-pay

## 2022-04-19 NOTE — Telephone Encounter (Signed)
Prior auth for Amanda Dixon 0.25MG /0.5ML auto-injectors has been started and approved. Amanda Dixon (Key: [id number]) Rx #: [id number]  Your prior authorization for [name] has been approved! Message from plan: [id number];IDHWYS:[id number];Review Type:Prior Auth;Coverage Start Date:03/20/2022;Coverage End Date:11/15/2022  Patient notified via mychart.

## 2022-04-24 ENCOUNTER — Other Ambulatory Visit: Payer: Self-pay | Admitting: Primary Care

## 2022-04-24 DIAGNOSIS — F411 Generalized anxiety disorder: Secondary | ICD-10-CM

## 2022-04-26 MED ORDER — PEN NEEDLES 31G X 6 MM MISC: 0 refills | Status: DC

## 2022-04-26 MED ORDER — SAXENDA 18 MG/3ML ~~LOC~~ SOPN: PEN_INJECTOR | SUBCUTANEOUS | 0 refills | Status: DC

## 2022-04-26 NOTE — Addendum Note (Signed)
Addended by: Pleas Koch on: 04/26/2022 04:17 PM   Modules accepted: Orders

## 2022-05-09 ENCOUNTER — Encounter (INDEPENDENT_AMBULATORY_CARE_PROVIDER_SITE_OTHER): Payer: Self-pay | Admitting: Internal Medicine

## 2022-05-09 ENCOUNTER — Ambulatory Visit (INDEPENDENT_AMBULATORY_CARE_PROVIDER_SITE_OTHER): Payer: 59 | Admitting: Internal Medicine

## 2022-05-09 VITALS — BP 167/92 | HR 78 | Temp 98.1°F | Ht 65.0 in | Wt 249.6 lb

## 2022-05-09 DIAGNOSIS — G4730 Sleep apnea, unspecified: Secondary | ICD-10-CM | POA: Diagnosis not present

## 2022-05-09 DIAGNOSIS — R7303 Prediabetes: Secondary | ICD-10-CM

## 2022-05-09 DIAGNOSIS — E7849 Other hyperlipidemia: Secondary | ICD-10-CM

## 2022-05-09 DIAGNOSIS — I10 Essential (primary) hypertension: Secondary | ICD-10-CM

## 2022-05-09 DIAGNOSIS — Z6841 Body Mass Index (BMI) 40.0 and over, adult: Secondary | ICD-10-CM

## 2022-05-09 DIAGNOSIS — Z0289 Encounter for other administrative examinations: Secondary | ICD-10-CM

## 2022-05-09 NOTE — Progress Notes (Signed)
Office: [phone]  /  Fax: [phone]  Initial Visit  Amanda Dixon was seen in clinic today to evaluate for obesity. She is interested in losing weight to improve overall health and reduce the risk of weight related complications.  She was referred by primary care physician.  She reports having progressive weight gain and difficulty losing or maintaining weight over the last 7 years.  She enjoys working out but despite her best efforts she continues to struggle.  She does not like how she feels and feels desperate at times.  She has tried [place] and phentermine in the past.  She was also recently prescribed Saxenda but has not been able to obtain medication.  She acknowledges having problems with emotional eating.  She rates her stress levels at 7 out of a scale of 1-10.  She cares for several family members and also works for a [employer].  She is currently not following a particular nutritional plan.  Associated conditions include: Hypertension, OSA, hyperlipidemia, prediabetes.  She presents today to review program treatment options, initial physical assessment, and evaluation.      Past medical history includes:   Past Medical History:  Diagnosis Date   Asthma    Hypertension    Sinus trouble      Objective:   BP (!) 167/92   Pulse 78   Temp 98.1 F (36.7 C)   Ht 5\' 5"  (1.651 m)   Wt 249 lb 9.6 oz (113.2 kg)   SpO2 98%   BMI 41.54 kg/m  She was weighed on the bioimpedance scale:  Body mass index is 41.54 kg/m.  General:  Alert, oriented and cooperative. Patient is in no acute distress.  Respiratory: Normal respiratory effort, no problems with respiration noted  Extremities: Normal range of motion.    Mental Status: Normal mood and affect. Normal behavior. Normal judgment and thought content.   Assessment and Plan:  1. Class 3 severe obesity without serious comorbidity with body mass index (BMI) of 40.0 to 44.9 in adult, unspecified obesity type (HCC) We  reviewed weight and associated medical conditions. Contributing factors include: Genetics, nutritional, OSA, stress, medications (Paxil). She would benefit from weight loss therapy via a calorie restricted, low-carb, high-protein nutritional plan tailored to her REE (resting energy expenditure) which is to be determined by indirect calorimetry.  We will also assess for cardiometabolic risk and nutritional derangements via serologies at her next visit.  She will follow-up with her prescribing physician regarding switching SSRI to a weight neutral SSRI like fluoxetine or escitalopram.  2. Prediabetes Most recent hemoglobin A1c was 5.9.  She is asymptomatic.  We discussed the benefits of weight loss therapy for diabetes prevention.  3. Primary hypertension Her blood pressure is uncontrolled.  Recheck was 150/85.  She is on losartan, hydrochlorothiazide and amlodipine and these are being titrated by her cardiologist.  I recommend she check her blood pressure in the morning and also before bedtime and to contact cardiologist before her next appointment if blood pressure remains above target.  We discussed the risk associated with uncontrolled blood pressure.  4. Severe sleep apnea Patient is having difficulty with tolerating mask and leaks.  We reviewed the risk associated with untreated sleep apnea.  She will work with her sleep specialist to look at other treatment options.        Obesity Treatment Plan:  She will work on garnering support from family and friends to begin weight loss journey. Work on eliminating or reducing the presence  of highly processed, calorie dense foods in the home. Complete provided nutritional and psychosocial assessment questionnaire.  Assess for cardiometabolic complications and nutritional deficiencies via serologies. Assess REE via indirect calorimetry at next office visit to guide the creation of a reduced calorie, high protein meal plan to promote loss of fat mass.    Davonna Belling will follow up in the next 1-2 weeks to review the above steps and continue evaluation and treatment.  Obesity Education Performed Today:  She was weighed on the bioimpedance scale and results were discussed and documented in the synopsis.  We discussed obesity as a disease and the importance of a more detailed evaluation of all the factors contributing to the disease.  We discussed the importance of long term lifestyle changes which include nutrition, exercise and behavioral modifications as well as the importance of customizing this to her specific health and social needs.  We discussed the benefits of reaching a healthier weight to alleviate the symptoms of existing conditions and reduce the risks of the biomechanical, metabolic and psychological effects of obesity.  Amanda Dixon appears to be in the action stage of change and states they are ready to start intensive lifestyle modifications and behavioral modifications.  30 minutes was spent today on this visit including the above counseling, pre-visit chart review, and post-visit documentation.  Thomes Dinning, MD

## 2022-05-14 ENCOUNTER — Ambulatory Visit (INDEPENDENT_AMBULATORY_CARE_PROVIDER_SITE_OTHER): Payer: 59 | Admitting: Internal Medicine

## 2022-05-14 ENCOUNTER — Encounter (INDEPENDENT_AMBULATORY_CARE_PROVIDER_SITE_OTHER): Payer: Self-pay | Admitting: Internal Medicine

## 2022-05-14 VITALS — BP 154/85 | HR 74 | Temp 98.3°F | Ht 65.0 in | Wt 247.4 lb

## 2022-05-14 DIAGNOSIS — R5383 Other fatigue: Secondary | ICD-10-CM | POA: Diagnosis not present

## 2022-05-14 DIAGNOSIS — Z6841 Body Mass Index (BMI) 40.0 and over, adult: Secondary | ICD-10-CM

## 2022-05-14 DIAGNOSIS — F419 Anxiety disorder, unspecified: Secondary | ICD-10-CM | POA: Insufficient documentation

## 2022-05-14 DIAGNOSIS — G4733 Obstructive sleep apnea (adult) (pediatric): Secondary | ICD-10-CM | POA: Diagnosis not present

## 2022-05-14 DIAGNOSIS — F32A Depression, unspecified: Secondary | ICD-10-CM | POA: Insufficient documentation

## 2022-05-14 DIAGNOSIS — E7849 Other hyperlipidemia: Secondary | ICD-10-CM | POA: Insufficient documentation

## 2022-05-14 DIAGNOSIS — R7303 Prediabetes: Secondary | ICD-10-CM

## 2022-05-14 DIAGNOSIS — R0602 Shortness of breath: Secondary | ICD-10-CM | POA: Diagnosis not present

## 2022-05-14 DIAGNOSIS — Z1331 Encounter for screening for depression: Secondary | ICD-10-CM | POA: Insufficient documentation

## 2022-05-14 DIAGNOSIS — F3289 Other specified depressive episodes: Secondary | ICD-10-CM

## 2022-05-14 DIAGNOSIS — I10 Essential (primary) hypertension: Secondary | ICD-10-CM | POA: Diagnosis not present

## 2022-05-14 NOTE — Progress Notes (Signed)
Office: [phone]  /  Fax: [phone]    Date: 05/27/2022   Appointment Start Time: 9:01am Duration: 53 minutes Provider: Glennie Dixon, Psy.D. Type of Session: Intake for Individual Therapy  Location of Patient: Home (private location) Location of Provider: Provider's home (private office) Type of Contact: Telepsychological Visit via MyChart Video Visit  Informed Consent: Prior to proceeding with today's appointment, two pieces of identifying information were obtained. In addition, Amanda Dixon's physical location at the time of this appointment was obtained as well a phone number she could be reached at in the event of technical difficulties. Amanda Dixon and this provider participated in today's telepsychological service.   The provider's role was explained to Amanda Dixon. The provider reviewed and discussed issues of confidentiality, privacy, and limits therein (e.g., reporting obligations). In addition to verbal informed consent, written informed consent for psychological services was obtained prior to the initial appointment. Since the clinic is not a 24/7 crisis center, mental health emergency resources were shared and this  provider explained MyChart, e-mail, voicemail, and/or other messaging systems should be utilized only for non-emergency reasons. This provider also explained that information obtained during appointments will be placed in Amanda Dixon's medical record and relevant information will be shared with other providers at [facility] for coordination of care. Amanda Dixon agreed information may be shared with other [facility] providers as needed for coordination of care and by signing the service agreement document, she provided written consent for coordination of care. Prior to initiating telepsychological services, Amanda Dixon completed an informed consent document, which included the development of a safety plan (i.e., an emergency contact and emergency resources) in the event  of an emergency/crisis. Amanda Dixon verbally acknowledged understanding she is ultimately responsible for understanding her insurance benefits for telepsychological and in-person services. This provider also reviewed confidentiality, as it relates to telepsychological services. Amanda Dixon  acknowledged understanding that appointments cannot be recorded without both party consent and she is aware she is responsible for securing confidentiality on her end of the session. Amanda Dixon verbally consented to proceed.  Chief Complaint/HPI: Amanda Dixon was referred by Dr. Maudry Mayhew due to  "other depression-emotional eating disorder" . Per the note for the initial visit with Dr. Maudry Mayhew on May 14, 2022, "Amanda Dixon has significant difficulties with eating associated with emotions, stress and difficulties controlling portions and impaired sense of satiety." The note for the initial appointment further indicated the following: "Amanda Dixon's habits were reviewed today and are as follows: Her family eats meals together, she thinks her family will eat healthier with her, her desired weight loss is 72-97 lbs, she started gaining weight in 2009, her heaviest weight ever was 250 pounds, she is a picky eater and doesn't like to eat healthier foods, she has significant food cravings issues, she snacks frequently in the evenings, she skips meals frequently, she is frequently drinking liquids with calories, she frequently makes poor food choices, she has binge eating behaviors, and she struggles with emotional eating." Amanda Dixon's Food and Mood (modified PHQ-9) score on May 14, 2022 was 21.  During today's appointment, Amanda Dixon reported her family celebrates "everything with food." She recalled a history of fluctuations in her weight. Delta believes the onset of emotional eating behaviors was likely in childhood, and described the current frequency of emotional eating behaviors as "daily." In addition, [name] denied a history of binge eating behaviors. She  acknowledged a history of taking laxatives when she ate "too much," noting the last time was in high school. [name] denied a  history of engagement in any other compensatory strategies, and has never been diagnosed with an eating disorder. She also denied a history of treatment for emotional eating behaviors. Currently, Amanda Dixon indicated frustration, anger, stress, and loss triggers emotional eating behaviors, whereas working out makes emotional eating behaviors better. Furthermore, Amanda Dixon discussed ongoing medical concerns and worry about her mother's health.    Mental Status Examination:  Appearance: neat Behavior: appropriate to circumstances Mood: neutral Affect: mood congruent Speech: WNL Eye Contact: appropriate Psychomotor Activity: WNL Gait: unable to assess  Thought Process: linear, logical, and goal directed and denies suicidal, homicidal, and self-harm ideation, plan and intent  Thought Content/Perception: no hallucinations, delusions, bizarre thinking or behavior endorsed or observed Orientation: AAOx4 Memory/Concentration: memory, attention, language, and fund of knowledge intact  Insight/Judgment: fair  Family & Psychosocial History: [name] reported she is married and she has one adult son and an adult step-daughter, adding she has four grandchildren. 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 Additionally, [name] shared her highest level of education obtained is a bachelor's degree. Currently, [name]'s social support system consists of her husband, son, daughter, granddaughters, sister, mother, really close work friends, and other close friends. Moreover, [name] stated she resides with her husband and three dogs.   Medical History:  Past Medical History:  Diagnosis Date   Anemia    Anxiety    Asthma    Back pain    Bilateral swelling of feet    Depression    Gallbladder problem    GERD (gastroesophageal reflux disease)    High cholesterol    History of  swelling of feet    Hypertension    IBS (irritable bowel syndrome)    Joint pain    Obesity    Sinus trouble    Sleep apnea    SOB (shortness of breath)    Vitamin B12 deficiency    Vitamin D deficiency    Past Surgical History:  Procedure Laterality Date   ABDOMINAL HYSTERECTOMY  2008   supracervical   CESAREAN SECTION  1990   CHOLECYSTECTOMY  1996/97?   TONSILLECTOMY     Current Outpatient Medications on File Prior to Visit  Medication Sig Dispense Refill   acetaminophen (TYLENOL) 325 MG tablet Take 650 mg by mouth every 6 (six) hours as needed.     albuterol (VENTOLIN HFA) 108 (90 Base) MCG/ACT inhaler TAKE 2 PUFFS BY MOUTH EVERY 6 HOURS AS NEEDED FOR WHEEZE OR SHORTNESS OF BREATH 8.5 each 0   amLODipine (NORVASC) 5 MG tablet Take 1 tablet (5 mg total) by mouth daily. for blood pressure. 90 tablet 1   Aspirin-Acetaminophen-Caffeine (EXCEDRIN PO) Take by mouth.     busPIRone (BUSPAR) 5 MG tablet TAKE 1 TABLET (5 MG TOTAL) BY MOUTH 2 (TWO) TIMES DAILY FOR ANXIETY. 180 tablet 0   famotidine (PEPCID) 20 MG tablet One at bedtime 30 tablet 11   Insulin Pen Needle (PEN NEEDLES) 31G X 6 MM MISC Use daily with Saxenda 100 each 0   Liraglutide -Weight Management (SAXENDA) 18 MG/3ML SOPN Inject 0.6 mg into the skin daily for 1 week, then increase to 1.2 mg into the skin daily thereafter. (Patient not taking: Reported on 05/15/2022) 15 mL 0   losartan (COZAAR) 25 MG tablet Take 1 tablet (25 mg total) by mouth daily. for blood pressure. 90 tablet 1   meloxicam (MOBIC) 7.5 MG tablet Take 7.5 mg by mouth 2 (two) times daily. (Patient not taking: Reported on 05/15/2022)  mometasone-formoterol (Amanda Dixon) 100-5 MCG/ACT AERO Inhale 2 puffs into the lungs 2 (two) times daily. 13 g 5   PARoxetine (PAXIL) 20 MG tablet Take 1 tablet (20 mg total) by mouth daily. For anxiety 90 tablet 0   rosuvastatin (CRESTOR) 10 MG tablet TAKE 1 TABLET BY MOUTH DAILY. FOR CHOLESTEROL 90 tablet 1   SUMAtriptan  (IMITREX) 50 MG tablet TAKE 1 TABLET MY MOUTH AT MIGRAINE ONSET. MAY REPEAT IN 2 HOURS IF HEADACHE PERSISTS OR RECURS. 10 tablet 5   triamcinolone (NASACORT AQ) 55 MCG/ACT nasal inhaler Place 2 sprays into the nose daily. 1 Inhaler 0   No current facility-administered medications on file prior to visit.  Amanda Dixon stated she is medication compliant.   Mental Health History: Amanda Dixon reported she attended a history of therapeutic services, noting the last time was 5-7 years ago to address work stressors. She stated her PCP currently prescribes Paxil and her cardiologist prescribed Buspar PRN. Aylyn reported there is no history of hospitalizations for psychiatric concerns. Amanda Dixon endorsed a family history of mental health related concerns on her father's side, but she is unsure of diagnoses. Amanda Dixon reported there is no history of trauma including psychological, physical , and sexual abuse, as well as neglect.   Amanda Dixon described her typical mood lately as "fine," but pain can impact her mood. She acknowledged experiencing crying spells due to pain and limited mobility. Amanda Dixon denied current alcohol use. She denied tobacco use. She denied illicit/recreational substance use. Furthermore, Gisel indicated she is not experiencing the following: hallucinations and delusions, paranoia, symptoms of mania , social withdrawal, panic attacks, memory concerns, attention and concentration issues, and obsessions and compulsions. She also denied history of and current suicidal ideation, plan, and intent; history of and current homicidal ideation, plan, and intent; and history of and current engagement in self-harm.  Legal History: Amanda Dixon reported there is no history of legal involvement.   Structured Assessments Results: The Patient Health Questionnaire-9 (PHQ-9) is a self-report measure that assesses symptoms and severity of depression over the course of the last two weeks. Amanda Dixon obtained a score of 10 suggesting moderate depression. Amanda Dixon  finds the endorsed symptoms to be somewhat difficult. [0= Not at all; 1= Several days; 2= More than half the days; 3= Nearly every day] Amanda Dixon interest or pleasure in doing things 0  Feeling down, depressed, or hopeless 1  Trouble falling or staying asleep, or sleeping too much- attributes it to sleep apnea 1  Feeling tired or having Amanda Dixon energy 3  Poor appetite or overeating 1  Feeling bad about yourself --- or that you are a failure or have let yourself or your family down 3  Trouble concentrating on things, such as reading the newspaper or watching television 1  Moving or speaking so slowly that other people could have noticed? Or the opposite --- being so fidgety or restless that you have been moving around a lot more than usual 0  Thoughts that you would be better off dead or hurting yourself in some way 0  PHQ-9 Score 10    The Generalized Anxiety Disorder-7 (GAD-7) is a brief self-report measure that assesses symptoms of anxiety over the course of the last two weeks. Amanda Dixon obtained a score of 5 suggesting mild anxiety. Amanda Dixon finds the endorsed symptoms to be somewhat difficult. [0= Not at all; 1= Several days; 2= Over half the days; 3= Nearly every day] Feeling nervous, anxious, on edge 1  Not being able to stop or control worrying 1  Worrying too much about different things 1  Trouble relaxing 1  Being so restless that it's hard to sit still 1  Becoming easily annoyed or irritable 0  Feeling afraid as if something awful might happen 0  GAD-7 Score 5   Interventions:  Conducted a chart review Focused on rapport building Verbally administered PHQ-9 and GAD-7 for symptom monitoring Provided emphatic reflections and validation Psychoeducation provided regarding physical versus emotional hunger Recommended/discussed option for longer-term therapeutic services  Diagnostic Impressions & Provisional DSM-5 Diagnosis(es): Dhanvi disclosed a history of engagement in emotional eating  behaviors likely starting in childhood and described the current frequency as daily. She denied current engagement in any other disordered eating behaviors. Based on the aforementioned, the following diagnosis was assigned: F50.89 Other Specified Feeding or Eating Disorder, Emotional Eating Behaviors. Oluwaseun also shared she is currently prescribed Paxil and disclosed anxiety-related symptoms on the GAD-7. She also reported pain can impact mood and cause crying spells. Additionally, she endorsed depression-related symptomatology on the PHQ-9. Given the limited scope of this appointment and this provider's role with the clinic, the following diagnoses were assigned: F41.9 Unspecified Anxiety Disorder, and  F32.A Unspecified Depressive Disorder. This provider recommended traditional therapeutic services to address ongoing stressors/symptomatology outside of eating-related concerns.   Plan: Levon declined traditional therapeutic services at this time; however, she noted a desire to continue with this provider to address eating-related concerns. She appears able and willing to participate as evidenced by collaboration on a treatment goal, engagement in reciprocal conversation, and asking questions as needed for clarification. The next appointment is scheduled for 06/17/2022 at 10am, which will be via MyChart Video Visit. The following treatment goal was established: increase coping skills. This provider will regularly review the treatment plan and medical chart to keep informed of status changes. Vickey expressed understanding and agreement with the initial treatment plan of care. Sakeena will be sent a handout via e-mail to utilize between now and the next appointment to increase awareness of hunger patterns and subsequent eating. Syble provided verbal consent during today's appointment for this provider to send the handout via e-mail.

## 2022-05-15 ENCOUNTER — Telehealth (INDEPENDENT_AMBULATORY_CARE_PROVIDER_SITE_OTHER): Payer: 59 | Admitting: Primary Care

## 2022-05-15 DIAGNOSIS — F411 Generalized anxiety disorder: Secondary | ICD-10-CM | POA: Diagnosis not present

## 2022-05-15 DIAGNOSIS — I10 Essential (primary) hypertension: Secondary | ICD-10-CM | POA: Diagnosis not present

## 2022-05-15 LAB — LIPID PANEL WITH LDL/HDL RATIO
Cholesterol, Total: 174 mg/dL (ref 100–199)
HDL: 48 mg/dL (ref >39)
LDL Chol Calc (NIH): 100 mg/dL — ABNORMAL HIGH (ref 0–99)
LDL/HDL Ratio: 2.1 ratio (ref 0.0–3.2)
Triglycerides: 151 mg/dL — ABNORMAL HIGH (ref 0–149)
VLDL Cholesterol Cal: 26 mg/dL (ref 5–40)

## 2022-05-15 LAB — INSULIN, RANDOM: INSULIN: 24.3 u[IU]/mL (ref 2.6–24.9)

## 2022-05-15 MED ORDER — PAROXETINE HCL 20 MG PO TABS: 20.0000 mg | ORAL_TABLET | Freq: Every day | ORAL | 0 refills | Status: DC

## 2022-05-15 NOTE — Assessment & Plan Note (Signed)
Above goal.  Discussed to resume amlodipine 5 mg daily. Continue losartan 25 mg daily.

## 2022-05-15 NOTE — Assessment & Plan Note (Signed)
Improved with removal of extraneous variables causing stress.    Discussed that paroxetine isn't directly causing weight gain, rather paroxetine can cause increased cravings which lead to increased food intake and weight gain. She and I agree to continue paroxetine as she has done so well on this historically. Since her anxiety has improved we will reduce her dose of paroxetine to 20 mg daily. Consider further dose reduction to 10 mg in the future if able.   Continue buspirone 5 mg PRN.   Continue to monitor.

## 2022-05-15 NOTE — Progress Notes (Signed)
Patient ID: Amanda Dixon, female    DOB: Jun 19, 1966, 56 y.o.   MRN: [id number]  Virtual visit completed through [employer], a video enabled telemedicine application. Due to national recommendations of social distancing due to COVID-19, a virtual visit is felt to be most appropriate for this patient at this time. Reviewed limitations, risks, security and privacy concerns of performing a virtual visit and the availability of in person appointments. I also reviewed that there may be a patient responsible charge related to this service. The patient agreed to proceed.   Patient location: home Provider location: [place] at [facility], office Persons participating in this virtual visit: patient, provider   If any vitals were documented, they were collected by patient at home unless specified below.    There were no vitals taken for this visit.   CC: Anxiety Subjective:   HPI: Amanda Dixon is a 56 y.o. female with a history of hypertension, GAD, morbid obesity, hyperlipidemia presenting on 05/15/2022 for Anxiety (Wants to discuss Paxil. )  Currently managed on paroxetine 40 mg for which she's been taking for years for anxiety. Overall well controlled on paroxetine but she is wanting to discuss switching to another agent for reasons of weight gain.  Recently evaluated at a weight loss clinic and it was advised that she discontinue paroxetine for side effects of weight gain and inhibition of weight loss.    She is interested in reducing her dose of paroxetine as she's done much better in terms of anxiety. Her family and personal stress has significantly reduced. She's starting to work her way back into exercise which has historically helped with anxiety. She's not taking buspirone consistently as she doesn't feel that she needs it.   She stopped taking amlodipine weeks ago.   Wt Readings from Last 3 Encounters:  05/14/22 247 lb 6.4 oz (112.2 kg)  05/09/22 249 lb 9.6 oz (113.2 kg)   04/18/22 249 lb (112.9 kg)        Relevant past medical, surgical, family and social history reviewed and updated as indicated. Interim medical history since our last visit reviewed. Allergies and medications reviewed and updated. Outpatient Medications Prior to Visit  Medication Sig Dispense Refill   acetaminophen (TYLENOL) 325 MG tablet Take 650 mg by mouth every 6 (six) hours as needed.     albuterol (VENTOLIN HFA) 108 (90 Base) MCG/ACT inhaler TAKE 2 PUFFS BY MOUTH EVERY 6 HOURS AS NEEDED FOR WHEEZE OR SHORTNESS OF BREATH 8.5 each 0   amLODipine (NORVASC) 5 MG tablet Take 1 tablet (5 mg total) by mouth daily. for blood pressure. 90 tablet 1   Aspirin-Acetaminophen-Caffeine (EXCEDRIN PO) Take by mouth.     busPIRone (BUSPAR) 5 MG tablet TAKE 1 TABLET (5 MG TOTAL) BY MOUTH 2 (TWO) TIMES DAILY FOR ANXIETY. 180 tablet 0   famotidine (PEPCID) 20 MG tablet One at bedtime 30 tablet 11   Insulin Pen Needle (PEN NEEDLES) 31G X 6 MM MISC Use daily with Saxenda 100 each 0   losartan (COZAAR) 25 MG tablet Take 1 tablet (25 mg total) by mouth daily. for blood pressure. 90 tablet 1   mometasone-formoterol (DULERA) 100-5 MCG/ACT AERO Inhale 2 puffs into the lungs 2 (two) times daily. 13 g 5   rosuvastatin (CRESTOR) 10 MG tablet TAKE 1 TABLET BY MOUTH DAILY. FOR CHOLESTEROL 90 tablet 1   SUMAtriptan (IMITREX) 50 MG tablet TAKE 1 TABLET MY MOUTH AT MIGRAINE ONSET. MAY REPEAT IN 2 HOURS IF HEADACHE PERSISTS  OR RECURS. 10 tablet 5   triamcinolone (NASACORT AQ) 55 MCG/ACT nasal inhaler Place 2 sprays into the nose daily. 1 Inhaler 0   PARoxetine (PAXIL) 40 MG tablet TAKE 1 TABLET (40 MG TOTAL) BY MOUTH EVERY MORNING. FOR ANXIETY 90 tablet 2   Liraglutide -Weight Management (SAXENDA) 18 MG/3ML SOPN Inject 0.6 mg into the skin daily for 1 week, then increase to 1.2 mg into the skin daily thereafter. (Patient not taking: Reported on 05/15/2022) 15 mL 0   meloxicam (MOBIC) 7.5 MG tablet Take 7.5 mg by mouth 2  (two) times daily. (Patient not taking: Reported on 05/15/2022)     No facility-administered medications prior to visit.     Per HPI unless specifically indicated in ROS section below Review of Systems  Respiratory:  Negative for shortness of breath.   Cardiovascular:  Negative for chest pain.  Psychiatric/Behavioral:  The patient is nervous/anxious.        Improved anxiety, see HPI   Objective:  There were no vitals taken for this visit.  Wt Readings from Last 3 Encounters:  05/14/22 247 lb 6.4 oz (112.2 kg)  05/09/22 249 lb 9.6 oz (113.2 kg)  04/18/22 249 lb (112.9 kg)       Physical exam: General: Alert and oriented x 3, no distress, does not appear sickly  Pulmonary: Speaks in complete sentences without increased work of breathing, no cough during visit.  Psychiatric: Normal mood, thought content, and behavior.     Results for orders placed or performed in visit on 05/14/22  Insulin, random  Result Value Ref Range   INSULIN 24.3 2.6 - 24.9 uIU/mL  Lipid Panel With LDL/HDL Ratio  Result Value Ref Range   Cholesterol, Total 174 100 - 199 mg/dL   Triglycerides 008 (H) 0 - 149 mg/dL   HDL 48 >67 mg/dL   VLDL Cholesterol Cal 26 5 - 40 mg/dL   LDL Chol Calc (NIH) 619 (H) 0 - 99 mg/dL   LDL/HDL Ratio 2.1 0.0 - 3.2 ratio  Vitamin B12  Result Value Ref Range   Vitamin B-12 377 232 - 1,245 pg/mL   Assessment & Plan:   Problem List Items Addressed This Visit       Cardiovascular and Mediastinum   Primary hypertension    Above goal.  Discussed to resume amlodipine 5 mg daily. Continue losartan 25 mg daily.        Other   GAD (generalized anxiety disorder) - Primary    Improved with removal of extraneous variables causing stress.    Discussed that paroxetine isn't directly causing weight gain, rather paroxetine can cause increased cravings which lead to increased food intake and weight gain. She and I agree to continue paroxetine as she has done so well on this  historically. Since her anxiety has improved we will reduce her dose of paroxetine to 20 mg daily. Consider further dose reduction to 10 mg in the future if able.   Continue buspirone 5 mg PRN.   Continue to monitor.        Relevant Medications   PARoxetine (PAXIL) 20 MG tablet     Meds ordered this encounter  Medications   PARoxetine (PAXIL) 20 MG tablet    Sig: Take 1 tablet (20 mg total) by mouth daily. For anxiety    Dispense:  90 tablet    Refill:  0    Order Specific Question:   Supervising Provider    Answer:   BEDSOLE, AMY E [[id number]]  No orders of the defined types were placed in this encounter.   I discussed the assessment and treatment plan with the patient. The patient was provided an opportunity to ask questions and all were answered. The patient agreed with the plan and demonstrated an understanding of the instructions. The patient was advised to call back or seek an in-person evaluation if the symptoms worsen or if the condition fails to improve as anticipated.  Follow up plan:  We reduced your dose of paroxetine to 20 mg daily for anxiety.   Let me know if you have problems with the lower dose.   It was a pleasure to see you today!  Doreene Nest, NP

## 2022-05-15 NOTE — Patient Instructions (Signed)
We reduced your dose of paroxetine to 20 mg daily for anxiety.   Let me know if you have problems with the lower dose.   It was a pleasure to see you today!

## 2022-05-20 ENCOUNTER — Encounter: Payer: Self-pay | Admitting: Primary Care

## 2022-05-20 ENCOUNTER — Ambulatory Visit (INDEPENDENT_AMBULATORY_CARE_PROVIDER_SITE_OTHER): Payer: 59 | Admitting: Primary Care

## 2022-05-20 VITALS — BP 110/80 | HR 79 | Temp 98.0°F | Ht 65.0 in | Wt 252.4 lb

## 2022-05-20 DIAGNOSIS — G4730 Sleep apnea, unspecified: Secondary | ICD-10-CM

## 2022-05-20 NOTE — Progress Notes (Signed)
Reviewed and agree with assessment/plan.   Chesley Mires, MD [facility] Pulmonary/Critical Care 05/20/2022, 2:42 PM Pager:  [phone]

## 2022-05-20 NOTE — Progress Notes (Signed)
@Patient  ID: , female    DOB: 06/06/1966, 56 y.o.   MRN: [id number]  No chief complaint on file.   Referring provider: [id number], NP  HPI: 56 year old female, never smoked.  Past medical history significant for hypertension, chronic migraine, venous insufficiency of both lower extremities, mild exercise-induced asthma, chronic fatigue, hyperlipidemia, hypersomnia, obesity.  Previous [facility] pulmonary encounter:  01/07/2022 Patient presents today for sleep consult.  Patient has symptoms of daytime sleepiness, loud snoring, restless sleep.  She has woken herself up gasping for air.  She is constantly tired. She has a lot of stress at home caring for family members and attributes her fatigue to this.  Her PCP recently increased Paxil and added BuSpar.  She has not been able to exercise since a back injury several months ago.  She has been attending physical therapy.  She was recently released to resume exercise. She has gained 30-40 lbs in the last year.  Typical bedtime is 10 PM.  It does not long for her to fall asleep.  She will wake up on average 2-4 times a night.  She starts her day at 6 AM.  Previa sleep studies.  She is not on CPAP or oxygen.  Epworth score 18.  Sleep questionnaire Symptoms- Louds snoring, wakes up choking, Daytime sleepiness, restless sleep  Prior sleep study- None Bedtime- 10pm Time to fall asleep- <15 mins  Nocturnal awakenings- 2-4 times Out of bed/start of day- 6am Weight changes- up 30 lbs Do you operate heavy machinery- No Do you currently wear CPAP- No Do you current wear oxygen- No Epworth- 18   03/22/2022 Patient presents today to review sleep study results.  She had a home sleep study on 03/05/2022 that showed severe obstructive sleep apnea, AHI 32.7 an hour with SPO2 low 82% (93% average).  We discussed treatment options including weight loss, oral appliance, CPAP therapy referral to ENT for possible surgical options.  She is anxious to  start CPAP but is willing to try.  She fears she will not be able to tolerate and that her partner will be disrupted by the noise from her CPAP machine.  She will be getting braces shortly so oral appliance likely not an option at this time.  She is wanting to work on weight loss, cardiology recommended she be started on Ozempic and she will discuss this with her PCP.  Severe sleep apnea - Patient has symptoms of snoring, restless sleep and daytime sleepiness.  Epworth score is 18.  Home sleep study on 03/05/2022 showed severe obstructive sleep apnea, AHI 32.7 an hour.  We discussed treatment options including weight loss, oral appliance, CPAP therapy and referral to ENT for possible surgical options.  Due to severity of her sleep apnea recommending patient be started on CPAP first. She is not likely candidate for oral appliance since she will be getting braces soon. She is anxious about starting CPAP, however, she is willing to try.  We will place an order for patient to be started on auto CPAP 5 to 15 cm H2O with mask of choice.  She is motivated to lose weight.  Advised against driving if experiencing excessive daytime sleepiness or fatigue.  Follow-up 31 to 90 days after starting CPAP for compliance check.  05/20/2022- interim hx  Patient presents today for 2 to 74-month follow-up.  She was started on auto CPAP back in August for severe sleep apnea, received CPAP unit end of September. Current pressure setting 5 to 15  cm H2O. She is having large amount abount airleaks and some residual apneas. She has tried several different CPAP masks except for full face mask. She prefers nasal pillow mask. Chin strap comes off. She has a side sleeper pillow. She got braces last week. Her dog chewed up power code, she was able to find a replacement cord locally.   Airview download 04/19/22-05/18/22 Usage 24/40 days; 70% > 4 hours Average usage 4 hours 51 mins Pressure 5-15cm h20 (14.7cm h20-05%) Airleaks 32.9L/min  (95%) AHI 10.9    Allergies  Allergen Reactions   Other Swelling    Bees/Wasps- redness    Immunization History  Administered Date(s) Administered   Influenza Split 04/28/2012   Janssen (J&J) SARS-COV-2 Vaccination 04/24/2020   Tdap 01/25/2019    Past Medical History:  Diagnosis Date   Anemia    Anxiety    Asthma    Back pain    Bilateral swelling of feet    Depression    Gallbladder problem    GERD (gastroesophageal reflux disease)    High cholesterol    History of swelling of feet    Hypertension    IBS (irritable bowel syndrome)    Joint pain    Obesity    Sinus trouble    Sleep apnea    SOB (shortness of breath)    Vitamin B12 deficiency    Vitamin D deficiency     Tobacco History: Social History   Tobacco Use  Smoking Status Never   Passive exposure: Past  Smokeless Tobacco Never  Tobacco Comments   NEVER SMOKER:  EXPOSED TO 2ND(HUSBAND) X SEVERAL YEARS.    Counseling given: Not Answered Tobacco comments: NEVER SMOKER:  EXPOSED TO 2ND(HUSBAND) X SEVERAL YEARS.    Outpatient Medications Prior to Visit  Medication Sig Dispense Refill   acetaminophen (TYLENOL) 325 MG tablet Take 650 mg by mouth every 6 (six) hours as needed.     albuterol (VENTOLIN HFA) 108 (90 Base) MCG/ACT inhaler TAKE 2 PUFFS BY MOUTH EVERY 6 HOURS AS NEEDED FOR WHEEZE OR SHORTNESS OF BREATH 8.5 each 0   amLODipine (NORVASC) 5 MG tablet Take 1 tablet (5 mg total) by mouth daily. for blood pressure. 90 tablet 1   Aspirin-Acetaminophen-Caffeine (EXCEDRIN PO) Take by mouth.     busPIRone (BUSPAR) 5 MG tablet TAKE 1 TABLET (5 MG TOTAL) BY MOUTH 2 (TWO) TIMES DAILY FOR ANXIETY. 180 tablet 0   famotidine (PEPCID) 20 MG tablet One at bedtime 30 tablet 11   Insulin Pen Needle (PEN NEEDLES) 31G X 6 MM MISC Use daily with Saxenda 100 each 0   losartan (COZAAR) 25 MG tablet Take 1 tablet (25 mg total) by mouth daily. for blood pressure. 90 tablet 1   mometasone-formoterol (DULERA) 100-5  MCG/ACT AERO Inhale 2 puffs into the lungs 2 (two) times daily. 13 g 5   PARoxetine (PAXIL) 20 MG tablet Take 1 tablet (20 mg total) by mouth daily. For anxiety 90 tablet 0   rosuvastatin (CRESTOR) 10 MG tablet TAKE 1 TABLET BY MOUTH DAILY. FOR CHOLESTEROL 90 tablet 1   SUMAtriptan (IMITREX) 50 MG tablet TAKE 1 TABLET MY MOUTH AT MIGRAINE ONSET. MAY REPEAT IN 2 HOURS IF HEADACHE PERSISTS OR RECURS. 10 tablet 5   triamcinolone (NASACORT AQ) 55 MCG/ACT nasal inhaler Place 2 sprays into the nose daily. 1 Inhaler 0   Liraglutide -Weight Management (SAXENDA) 18 MG/3ML SOPN Inject 0.6 mg into the skin daily for 1 week, then increase to 1.2 mg  into the skin daily thereafter. (Patient not taking: Reported on 05/15/2022) 15 mL 0   meloxicam (MOBIC) 7.5 MG tablet Take 7.5 mg by mouth 2 (two) times daily. (Patient not taking: Reported on 05/15/2022)     No facility-administered medications prior to visit.    Review of Systems  Review of Systems  Constitutional:  Positive for fatigue.  HENT: Negative.    Respiratory: Negative.    Cardiovascular: Negative.     Physical Exam  BP 110/80 (BP Location: Left Arm, Patient Position: Sitting, Cuff Size: Large)   Pulse 79   Temp 98 F (36.7 C) (Oral)   Ht 5\' 5"  (1.651 m)   Wt 252 lb 6.4 oz (114.5 kg)   SpO2 97%   BMI 42.00 kg/m  Physical Exam Constitutional:      Appearance: Normal appearance.  Cardiovascular:     Rate and Rhythm: Normal rate.  Pulmonary:     Effort: Pulmonary effort is normal.  Musculoskeletal:        General: Normal range of motion.  Skin:    General: Skin is warm and dry.  Neurological:     General: No focal deficit present.     Mental Status: She is alert and oriented to person, place, and time. Mental status is at baseline.  Psychiatric:        Mood and Affect: Mood normal.        Behavior: Behavior normal.        Thought Content: Thought content normal.        Judgment: Judgment normal.      Lab Results:  CBC     Component Value Date/Time   WBC 11.1 (H) 08/06/2021 1746   RBC 4.42 08/06/2021 1746   HGB 13.6 08/06/2021 1746   HCT 41.1 08/06/2021 1746   PLT 321 08/06/2021 1746   MCV 93.0 08/06/2021 1746   MCH 30.8 08/06/2021 1746   MCHC 33.1 08/06/2021 1746   RDW 13.8 08/06/2021 1746   LYMPHSABS 4.0 12/19/2016 1654   MONOABS 0.7 12/19/2016 1654   EOSABS 0.2 12/19/2016 1654   BASOSABS 0.2 (H) 12/19/2016 1654    BMET    Component Value Date/Time   NA 140 08/21/2021 0832   NA 138 01/27/2019 1204   K 4.2 08/21/2021 0832   CL 104 08/21/2021 0832   CO2 31 08/21/2021 0832   GLUCOSE 91 08/21/2021 0832   BUN 10 08/21/2021 0832   BUN 9 01/27/2019 1204   CREATININE 0.66 08/21/2021 0832   CALCIUM 9.1 08/21/2021 0832   GFRNONAA >60 08/06/2021 1746   GFRAA 100 01/27/2019 1204    BNP No results found for: "BNP"  ProBNP    Component Value Date/Time   PROBNP 33.0 01/25/2020 1455    Imaging: No results found.   Assessment & Plan:   Severe sleep apnea - Patient has severe sleep apnea, started on CPAP in September.  She is 70% compliant with CPAP use greater than 4 hours over the last 30 days.  She has been having issues with airleaks along with residual apneas.  Pressure setting 5 to 15 cm H2O; AHI 10.9/hour. We will place an order to adjust pressure 10-20cm H20 and for patient to be provided fullface mask.  Follow-up in 4 weeks virtual visit, if patient is still having issues we will get a mask desensitization study.   [place], NP 05/20/2022

## 2022-05-20 NOTE — Assessment & Plan Note (Signed)
-   Patient has severe sleep apnea, started on CPAP in September.  She is 70% compliant with CPAP use greater than 4 hours over the last 30 days.  She has been having issues with airleaks along with residual apneas.  Pressure setting 5 to 15 cm H2O; AHI 10.9/hour. We will place an order to adjust pressure 10-20cm H20 and for patient to be provided fullface mask.  Follow-up in 4 weeks virtual visit, if patient is still having issues we will get a mask desensitization study.

## 2022-05-20 NOTE — Patient Instructions (Addendum)
Orders: Adjust CPAP pressure 10-20/ and turn off ramp Provide patient with full faced mask  Follow-up: 4 weeks with Beth NP - virtual   CPAP and BIPAP Information CPAP and BIPAP are methods that use air pressure to keep your airways open and to help you breathe well. CPAP and BIPAP use different amounts of pressure. Your health care provider will tell you whether CPAP or BIPAP would be more helpful for you. CPAP stands for "continuous positive airway pressure." With CPAP, the amount of pressure stays the same while you breathe in (inhale) and out (exhale). BIPAP stands for "bi-level positive airway pressure." With BIPAP, the amount of pressure will be higher when you inhale and lower when you exhale. This allows you to take larger breaths. CPAP or BIPAP may be used in the hospital, or your health care provider may want you to use it at home. You may need to have a sleep study before your health care provider can order a machine for you to use at home. What are the advantages? CPAP or BIPAP can be helpful if you have: Sleep apnea. Chronic obstructive pulmonary disease (COPD). Heart failure. Medical conditions that cause muscle weakness, including muscular dystrophy or amyotrophic lateral sclerosis (ALS). Other problems that cause breathing to be shallow, weak, abnormal, or difficult. CPAP and BIPAP are most commonly used for obstructive sleep apnea (OSA) to keep the airways from collapsing when the muscles relax during sleep. What are the risks? Generally, this is a safe treatment. However, problems may occur, including: Irritated skin or skin sores if the mask does not fit properly. Dry or stuffy nose or nosebleeds. Dry mouth. Feeling gassy or bloated. Sinus or lung infection if the equipment is not cleaned properly. When should CPAP or BIPAP be used? In most cases, the mask only needs to be worn during sleep. Generally, the mask needs to be worn throughout the night and during any  daytime naps. People with certain medical conditions may also need to wear the mask at other times, such as when they are awake. Follow instructions from your health care provider about when to use the machine. What happens during CPAP or BIPAP?  Both CPAP and BIPAP are provided by a small machine with a flexible plastic tube that attaches to a plastic mask that you wear. Air is blown through the mask into your nose or mouth. The amount of pressure that is used to blow the air can be adjusted on the machine. Your health care provider will set the pressure setting and help you find the best mask for you. Tips for using the mask Because the mask needs to be snug, some people feel trapped or closed-in (claustrophobic) when first using the mask. If you feel this way, you may need to get used to the mask. One way to do this is to hold the mask loosely over your nose or mouth and then gradually apply the mask more snugly. You can also gradually increase the amount of time that you use the mask. Masks are available in various types and sizes. If your mask does not fit well, talk with your health care provider about getting a different one. Some common types of masks include: Full face masks, which fit over the mouth and nose. Nasal masks, which fit over the nose. Nasal pillow or prong masks, which fit into the nostrils. If you are using a mask that fits over your nose and you tend to breathe through your mouth, a chin strap  may be applied to help keep your mouth closed. Use a skin barrier to protect your skin as told by your health care provider. Some CPAP and BIPAP machines have alarms that may sound if the mask comes off or develops a leak. If you have trouble with the mask, it is very important that you talk with your health care provider about finding a way to make the mask easier to tolerate. Do not stop using the mask. There could be a negative impact on your health if you stop using the mask. Tips for  using the machine Place your CPAP or BIPAP machine on a secure table or stand near an electrical outlet. Know where the on/off switch is on the machine. Follow instructions from your health care provider about how to set the pressure on your machine and when you should use it. Do not eat or drink while the CPAP or BIPAP machine is on. Food or fluids could get pushed into your lungs by the pressure of the CPAP or BIPAP. For home use, CPAP and BIPAP machines can be rented or purchased through home health care companies. Many different brands of machines are available. Renting a machine before purchasing may help you find out which particular machine works well for you. Your health insurance company may also decide which machine you may get. Keep the CPAP or BIPAP machine and attachments clean. Ask your health care provider for specific instructions. Check the humidifier if you have a dry stuffy nose or nosebleeds. Make sure it is working correctly. Follow these instructions at home: Take over-the-counter and prescription medicines only as told by your health care provider. Ask if you can take sinus medicine if your sinuses are blocked. Do not use any products that contain nicotine or tobacco. These products include cigarettes, chewing tobacco, and vaping devices, such as e-cigarettes. If you need help quitting, ask your health care provider. Keep all follow-up visits. This is important. Contact a health care provider if: You have redness or pressure sores on your head, face, mouth, or nose from the mask or head gear. You have trouble using the CPAP or BIPAP machine. You cannot tolerate wearing the CPAP or BIPAP mask. Someone tells you that you snore even when wearing your CPAP or BIPAP. Get help right away if: You have trouble breathing. You feel confused. Summary CPAP and BIPAP are methods that use air pressure to keep your airways open and to help you breathe well. If you have trouble with the  mask, it is very important that you talk with your health care provider about finding a way to make the mask easier to tolerate. Do not stop using the mask. There could be a negative impact to your health if you stop using the mask. Follow instructions from your health care provider about when to use the machine. This information is not intended to replace advice given to you by your health care provider. Make sure you discuss any questions you have with your health care provider. Document Revised: 02/21/2021 Document Reviewed: 06/23/2020 Elsevier Patient Education  [place].

## 2022-05-21 NOTE — Progress Notes (Unsigned)
Chief Complaint:   OBESITY Amanda Dixon (MR# [id number]) is a 56 y.o. female who presents for evaluation and treatment of obesity and related comorbidities. Current BMI is Body mass index is 41.17 kg/m. Amanda Dixon has been struggling with her weight for many years and has been unsuccessful in either losing weight, maintaining weight loss, or reaching her healthy weight goal.  Amanda Dixon is currently in the action stage of change and ready to dedicate time achieving and maintaining a healthier weight. Amanda Dixon is interested in becoming our patient and working on intensive lifestyle modifications including (but not limited to) diet and exercise for weight loss.  Amanda Dixon's habits were reviewed today and are as follows: {MWM WT HABITS:23461}.  Depression Screen Amanda Dixon's Food and Mood (modified PHQ-9) score was ***.     05/14/2022    9:10 AM  Depression screen PHQ 2/9  Decreased Interest 3  Down, Depressed, Hopeless 3  PHQ - 2 Score 6  Altered sleeping 3  Tired, decreased energy 3  Change in appetite 3  Feeling bad or failure about yourself  2  Trouble concentrating 1  Moving slowly or fidgety/restless 3  Suicidal thoughts 0  PHQ-9 Score 21  Difficult doing work/chores Somewhat difficult   Subjective:   1. Other fatigue Amanda Dixon {Actions; denies/reports/admits to:19208} daytime somnolence and {Actions; denies/reports/admits to:19208} waking up still tired. Patient has a history of symptoms of {OSA Sx:17850}. Amanda Dixon generally gets {numbers (fuzzy):14653} hours of sleep per night, and states that she has {sleep quality:17851}. Snoring {is/are not:32546} present. Apneic episodes {is/are not:32546} present. Epworth Sleepiness Score is ***.   2. SOB (shortness of breath) Amanda Dixon notes increasing shortness of breath with exercising and seems to be worsening over time with weight gain. She notes getting out of breath sooner with activity than she used to. This has not gotten worse recently. Amanda Dixon denies shortness  of breath at rest or orthopnea.  3. Hypertension, essential ***  4. Prediabetes ***  5. Other hyperlipidemia ***  6. Obstructive sleep apnea ***  7. Other depression-emotional eating disorder ***  Assessment/Plan:   1. Other fatigue Amanda Dixon does feel that her weight is causing her energy to be lower than it should be. Fatigue may be related to obesity, depression or many other causes. Labs will be ordered, and in the meanwhile, Amanda Dixon will focus on self care including making healthy food choices, increasing physical activity and focusing on stress reduction.  - CBC with Differential/Platelet; Future - Comprehensive metabolic panel; Future - TSH; Future - VITAMIN D 25 Hydroxy (Vit-D Deficiency, Fractures); Future - EKG 12-Lead - Vitamin B12  2. SOB (shortness of breath) Amanda Dixon does feel that she gets out of breath more easily that she used to when she exercises. Amanda Dixon's shortness of breath appears to be obesity related and exercise induced. She has agreed to work on weight loss and gradually increase exercise to treat her exercise induced shortness of breath. Will continue to monitor closely.  3. Hypertension, essential ***  4. Prediabetes *** - Hemoglobin A1c; Future - Insulin, random  5. Other hyperlipidemia *** - Lipid Panel With LDL/HDL Ratio  6. Obstructive sleep apnea ***  7. Other depression-emotional eating disorder ***  8. Depression screening Amanda Dixon had a positive depression screening. Depression is commonly associated with obesity and often results in emotional eating behaviors. We will monitor this closely and work on CBT to help improve the non-hunger eating patterns. Referral to Psychology may be required if no improvement is seen as she continues  in our clinic.  9. Class 3 severe obesity without serious comorbidity with body mass index (BMI) of 40.0 to 44.9 in adult, unspecified obesity type (HCC) Amanda Dixon is currently in the action stage of change and her goal  is to continue with weight loss efforts. I recommend Amanda Dixon begin the structured treatment plan as follows:  She has agreed to the Category 3 Plan.  Exercise goals: As is.    Behavioral modification strategies: increasing lean protein intake, decreasing simple carbohydrates, increasing vegetables, no skipping meals, meal planning and cooking strategies, keeping healthy foods in the home, better snacking choices, emotional eating strategies, avoiding temptations, and planning for success.  She was informed of the importance of frequent follow-up visits to maximize her success with intensive lifestyle modifications for her multiple health conditions. She was informed we would discuss her lab results at her next visit unless there is a critical issue that needs to be addressed sooner. Amanda Dixon agreed to keep her next visit at the agreed upon time to discuss these results.  Objective:   Blood pressure (!) 154/85, pulse 74, temperature 98.3 F (36.8 C), height 5\' 5"  (1.651 m), weight 247 lb 6.4 oz (112.2 kg), SpO2 96 %. Body mass index is 41.17 kg/m.  EKG: Normal sinus rhythm, rate 74 BPM.  Indirect Calorimeter completed today shows a VO2 of (unable to obtain) and a REE of 1987.  Her calculated basal metabolic rate is 0272 thus her basal metabolic rate is better than expected.  General: Cooperative, alert, well developed, in no acute distress. HEENT: Conjunctivae and lids unremarkable. Cardiovascular: Regular rhythm.  Lungs: Normal work of breathing. Neurologic: No focal deficits.   Lab Results  Component Value Date   CREATININE 0.66 08/21/2021   BUN 10 08/21/2021   NA 140 08/21/2021   K 4.2 08/21/2021   CL 104 08/21/2021   CO2 31 08/21/2021   Lab Results  Component Value Date   ALT 15 06/01/2021   AST 17 06/01/2021   ALKPHOS 65 06/01/2021   BILITOT 0.5 06/01/2021   Lab Results  Component Value Date   HGBA1C 5.9 12/21/2021   HGBA1C 5.7 06/01/2021   HGBA1C 5.7 01/25/2020   HGBA1C  5.5 01/27/2019   Lab Results  Component Value Date   INSULIN 24.3 05/14/2022   Lab Results  Component Value Date   TSH 2.95 06/01/2021   Lab Results  Component Value Date   CHOL 174 05/14/2022   HDL 48 05/14/2022   LDLCALC 100 (H) 05/14/2022   TRIG 151 (H) 05/14/2022   CHOLHDL 3 07/10/2021   Lab Results  Component Value Date   WBC 11.1 (H) 08/06/2021   HGB 13.6 08/06/2021   HCT 41.1 08/06/2021   MCV 93.0 08/06/2021   PLT 321 08/06/2021   No results found for: "IRON", "TIBC", "FERRITIN"  Attestation Statements:   Reviewed by clinician on day of visit: allergies, medications, problem list, medical history, surgical history, family history, social history, and previous encounter notes.  Time spent on visit including pre-visit chart review and post-visit charting and care was 40 minutes.   I, Trixie Dredge, am acting as transcriptionist for Thomes Dinning, MD.  I have reviewed the above documentation for accuracy and completeness, and I agree with the above. - ***

## 2022-05-27 ENCOUNTER — Telehealth (INDEPENDENT_AMBULATORY_CARE_PROVIDER_SITE_OTHER): Payer: 59 | Admitting: Psychology

## 2022-05-27 DIAGNOSIS — F419 Anxiety disorder, unspecified: Secondary | ICD-10-CM | POA: Diagnosis not present

## 2022-05-27 DIAGNOSIS — F32A Depression, unspecified: Secondary | ICD-10-CM | POA: Diagnosis not present

## 2022-05-27 DIAGNOSIS — F5089 Other specified eating disorder: Secondary | ICD-10-CM | POA: Diagnosis not present

## 2022-05-28 ENCOUNTER — Encounter (INDEPENDENT_AMBULATORY_CARE_PROVIDER_SITE_OTHER): Payer: Self-pay | Admitting: Internal Medicine

## 2022-05-28 ENCOUNTER — Ambulatory Visit (INDEPENDENT_AMBULATORY_CARE_PROVIDER_SITE_OTHER): Payer: 59 | Admitting: Internal Medicine

## 2022-05-28 VITALS — BP 136/92 | HR 73 | Temp 98.1°F | Ht 65.0 in | Wt 245.0 lb

## 2022-05-28 DIAGNOSIS — R7303 Prediabetes: Secondary | ICD-10-CM

## 2022-05-28 DIAGNOSIS — E161 Other hypoglycemia: Secondary | ICD-10-CM

## 2022-05-28 DIAGNOSIS — I10 Essential (primary) hypertension: Secondary | ICD-10-CM

## 2022-05-28 DIAGNOSIS — E669 Obesity, unspecified: Secondary | ICD-10-CM | POA: Diagnosis not present

## 2022-05-28 DIAGNOSIS — R5383 Other fatigue: Secondary | ICD-10-CM | POA: Diagnosis not present

## 2022-05-28 DIAGNOSIS — F3289 Other specified depressive episodes: Secondary | ICD-10-CM

## 2022-05-28 DIAGNOSIS — Z6841 Body Mass Index (BMI) 40.0 and over, adult: Secondary | ICD-10-CM

## 2022-05-29 DIAGNOSIS — E161 Other hypoglycemia: Secondary | ICD-10-CM | POA: Insufficient documentation

## 2022-05-29 HISTORY — DX: Other hypoglycemia: E16.1

## 2022-05-29 LAB — CBC WITH DIFFERENTIAL
Basophils Absolute: 0 10*3/uL (ref 0.0–0.2)
Basos: 0 %
EOS (ABSOLUTE): 0 10*3/uL (ref 0.0–0.4)
Eos: 0 %
Hematocrit: 40.1 % (ref 34.0–46.6)
Hemoglobin: 13.4 g/dL (ref 11.1–15.9)
Immature Grans (Abs): 0.1 10*3/uL (ref 0.0–0.1)
Immature Granulocytes: 1 %
Lymphocytes Absolute: 2.5 10*3/uL (ref 0.7–3.1)
Lymphs: 17 %
MCH: 29.8 pg (ref 26.6–33.0)
MCHC: 33.4 g/dL (ref 31.5–35.7)
MCV: 89 fL (ref 79–97)
Monocytes Absolute: 0.8 10*3/uL (ref 0.1–0.9)
Monocytes: 5 %
Neutrophils Absolute: 11.1 10*3/uL — ABNORMAL HIGH (ref 1.4–7.0)
Neutrophils: 77 %
RBC: 4.5 x10E6/uL (ref 3.77–5.28)
RDW: 13.1 % (ref 11.7–15.4)
WBC: 14.5 10*3/uL — ABNORMAL HIGH (ref 3.4–10.8)

## 2022-05-29 LAB — CMP14+EGFR
ALT: 13 IU/L (ref 0–32)
AST: 21 IU/L (ref 0–40)
Albumin/Globulin Ratio: 1.5 (ref 1.2–2.2)
Albumin: 4.4 g/dL (ref 3.8–4.9)
Alkaline Phosphatase: 73 IU/L (ref 44–121)
BUN/Creatinine Ratio: 19 (ref 9–23)
BUN: 13 mg/dL (ref 6–24)
Bilirubin Total: 0.2 mg/dL (ref 0.0–1.2)
CO2: 21 mmol/L (ref 20–29)
Calcium: 9.4 mg/dL (ref 8.7–10.2)
Chloride: 100 mmol/L (ref 96–106)
Creatinine, Ser: 0.68 mg/dL (ref 0.57–1.00)
Globulin, Total: 3 g/dL (ref 1.5–4.5)
Glucose: 91 mg/dL (ref 70–99)
Potassium: 4.6 mmol/L (ref 3.5–5.2)
Sodium: 140 mmol/L (ref 134–144)
Total Protein: 7.4 g/dL (ref 6.0–8.5)
eGFR: 102 mL/min/{1.73_m2} (ref >59)

## 2022-05-29 LAB — VITAMIN D 25 HYDROXY (VIT D DEFICIENCY, FRACTURES): Vit D, 25-Hydroxy: 27.1 ng/mL — ABNORMAL LOW (ref 30.0–100.0)

## 2022-05-29 LAB — TSH: TSH: 2.34 u[IU]/mL (ref 0.450–4.500)

## 2022-05-29 LAB — HEMOGLOBIN A1C
Est. average glucose Bld gHb Est-mCnc: 123 mg/dL
Hgb A1c MFr Bld: 5.9 % — ABNORMAL HIGH (ref 4.8–5.6)

## 2022-05-31 ENCOUNTER — Other Ambulatory Visit (INDEPENDENT_AMBULATORY_CARE_PROVIDER_SITE_OTHER): Payer: Self-pay | Admitting: Internal Medicine

## 2022-05-31 DIAGNOSIS — E559 Vitamin D deficiency, unspecified: Secondary | ICD-10-CM

## 2022-05-31 MED ORDER — VITAMIN D (ERGOCALCIFEROL) 1.25 MG (50000 UNIT) PO CAPS: 50000.0000 [IU] | ORAL_CAPSULE | ORAL | 0 refills | Status: DC

## 2022-06-04 NOTE — Progress Notes (Signed)
Chief Complaint:   OBESITY Amanda Dixon is here to discuss her progress with her obesity treatment plan along with follow-up of her obesity related diagnoses. Amanda Dixon is on the Category 3 Plan and states she is following her eating plan approximately 0% of the time. Amanda Dixon states she is doing 0 minutes 0 times per week.  Today's visit was #: 2 Starting weight: 247 lbs Starting date: 05/14/2022 Today's weight: 245 lbs Today's date: 05/28/2022 Total lbs lost to date: 2 Total lbs lost since last in-office visit: 2  Interim History: Amanda Dixon is present for follow-up.  She reports some setbacks related to her back and some traveling.  She is gradually incorporating nutritional changes.  She is working on reducing smoothies and soda.  She is working with behavioral health to assist with eating patterns.  She notes increased cravings.  She saw her PCP and her paroxetine was decreased to 20 mg a day.  Subjective:   1. Hyperinsulinemia Arma's insulin level is elevated.  We are awaiting A1c results.  This may contribute to her weight gain and carbohydrate cravings.  I discussed labs with the patient today.  2. Other fatigue Amanda Dixon's labs were not completed due to internal processing error.  Patient has been informed.  3. Other depression-emotional eating Amanda Dixon's emotional eating symptoms are improving.  She is trying to incorporate coping strategies.  She has been seen by Amanda Dixon and she is working with the patient.  Assessment/Plan:   1. Hyperinsulinemia We will check labs today. Amanda Dixon was counseled on reducing simple sugars.  We will consider pharmacotherapy and she will follow-up with Amanda Dixon, support is appreciated.    - Hemoglobin A1c  2. Other fatigue We will check labs today, and we will follow-up at [facility] next office visit.   - CMP14+EGFR - CBC With Differential - Hemoglobin A1c - TSH - VITAMIN D 25 Hydroxy (Vit-D Deficiency, Fractures)  3. Other depression-emotional eating Amanda Dixon  will continue with behavioral therapy.  Will consider pharmacotherapy in the near future.  4. Obesity current BMI 40.8 Amanda Dixon is currently in the action stage of change. As such, her goal is to continue with weight loss efforts. She has agreed to the Category 3 Plan.   Patient has been encountering multiple barriers.  She will work on implementing changes and behavioral techniques over the next 2 weeks.  Exercise goals: Patient will resume exercise once her back improves.  Behavioral modification strategies: increasing lean protein intake, decreasing simple carbohydrates, decreasing liquid calories, meal planning and cooking strategies, travel eating strategies, and celebration eating strategies.  Amanda Dixon has agreed to follow-up with our clinic in 2 to 3 weeks. She was informed of the importance of frequent follow-up visits to maximize her success with intensive lifestyle modifications for her multiple health conditions.   Amanda Dixon was informed we would discuss her lab results at her next visit unless there is a critical issue that needs to be addressed sooner. Amanda Dixon agreed to keep her next visit at the agreed upon time to discuss these results.  Objective:   Blood pressure (!) 136/92, pulse 73, temperature 98.1 F (36.7 C), height _0  (1.651 m), weight 245 lb (111.1 kg), SpO2 97 %. Body mass index is 40.77 kg/m.  General: Cooperative, alert, well developed, in no acute distress. HEENT: Conjunctivae and lids unremarkable. Cardiovascular: Regular rhythm.  Lungs: Normal work of breathing. Neurologic: No focal deficits.   Lab Results  Component Value Date   CREATININE 0.68 05/28/2022   BUN  13 05/28/2022   NA 140 05/28/2022   K 4.6 05/28/2022   CL 100 05/28/2022   CO2 21 05/28/2022   Lab Results  Component Value Date   ALT 13 05/28/2022   AST 21 05/28/2022   ALKPHOS 73 05/28/2022   BILITOT 0.2 05/28/2022   Lab Results  Component Value Date   HGBA1C 5.9 (H) 05/28/2022   HGBA1C 5.9  12/21/2021   HGBA1C 5.7 06/01/2021   HGBA1C 5.7 01/25/2020   HGBA1C 5.5 01/27/2019   Lab Results  Component Value Date   INSULIN 24.3 05/14/2022   Lab Results  Component Value Date   TSH 2.340 05/28/2022   Lab Results  Component Value Date   CHOL 174 05/14/2022   HDL 48 05/14/2022   LDLCALC 100 (H) 05/14/2022   TRIG 151 (H) 05/14/2022   CHOLHDL 3 07/10/2021   Lab Results  Component Value Date   VD25OH 27.1 (L) 05/28/2022   Lab Results  Component Value Date   WBC 14.5 (H) 05/28/2022   HGB 13.4 05/28/2022   HCT 40.1 05/28/2022   MCV 89 05/28/2022   PLT 321 08/06/2021   No results found for: "IRON", "TIBC", "FERRITIN"  Attestation Statements:   Reviewed by clinician on day of visit: allergies, medications, problem list, medical history, surgical history, family history, social history, and previous encounter notes.  Time spent on visit including pre-visit chart review and post-visit care and charting was 40 minutes.   Wilhemena Durie, am acting as transcriptionist for Thomes Dinning, MD.  I have reviewed the above documentation for accuracy and completeness, and I agree with the above. -Thomes Dinning, MD

## 2022-06-16 NOTE — Progress Notes (Deleted)
@Patient  ID: , female    DOB: Dec 10, 1965, 56 y.o.   MRN: [id number]  No chief complaint on file.   Referring provider: [id number], NP  HPI:  56 year old female, never smoked.  Past medical history significant for hypertension, chronic migraine, venous insufficiency of both lower extremities, mild exercise-induced asthma, chronic fatigue, hyperlipidemia, hypersomnia, obesity.  Previous [facility] pulmonary encounter:  01/07/2022 Patient presents today for sleep consult.  Patient has symptoms of daytime sleepiness, loud snoring, restless sleep.  She has woken herself up gasping for air.  She is constantly tired. She has a lot of stress at home caring for family members and attributes her fatigue to this.  Her PCP recently increased Paxil and added BuSpar.  She has not been able to exercise since a back injury several months ago.  She has been attending physical therapy.  She was recently released to resume exercise. She has gained 30-40 lbs in the last year.  Typical bedtime is 10 PM.  It does not long for her to fall asleep.  She will wake up on average 2-4 times a night.  She starts her day at 6 AM.  Previa sleep studies.  She is not on CPAP or oxygen.  Epworth score 18.  Sleep questionnaire Symptoms- Louds snoring, wakes up choking, Daytime sleepiness, restless sleep  Prior sleep study- None Bedtime- 10pm Time to fall asleep- <15 mins  Nocturnal awakenings- 2-4 times Out of bed/start of day- 6am Weight changes- up 30 lbs Do you operate heavy machinery- No Do you currently wear CPAP- No Do you current wear oxygen- No Epworth- 18   03/22/2022 Patient presents today to review sleep study results.  She had a home sleep study on 03/05/2022 that showed severe obstructive sleep apnea, AHI 32.7 an hour with SPO2 low 82% (93% average).  We discussed treatment options including weight loss, oral appliance, CPAP therapy referral to ENT for possible surgical options.  She is anxious  to start CPAP but is willing to try.  She fears she will not be able to tolerate and that her partner will be disrupted by the noise from her CPAP machine.  She will be getting braces shortly so oral appliance likely not an option at this time.  She is wanting to work on weight loss, cardiology recommended she be started on Ozempic and she will discuss this with her PCP.  Severe sleep apnea - Patient has symptoms of snoring, restless sleep and daytime sleepiness.  Epworth score is 18.  Home sleep study on 03/05/2022 showed severe obstructive sleep apnea, AHI 32.7 an hour.  We discussed treatment options including weight loss, oral appliance, CPAP therapy and referral to ENT for possible surgical options.  Due to severity of her sleep apnea recommending patient be started on CPAP first. She is not likely candidate for oral appliance since she will be getting braces soon. She is anxious about starting CPAP, however, she is willing to try.  We will place an order for patient to be started on auto CPAP 5 to 15 cm H2O with mask of choice.  She is motivated to lose weight.  Advised against driving if experiencing excessive daytime sleepiness or fatigue.  Follow-up 31 to 90 days after starting CPAP for compliance check.  05/20/2022 Patient presents today for 2 to 70-month follow-up.  She was started on auto CPAP back in August for severe sleep apnea, received CPAP unit end of September. Current pressure setting 5 to 15 cm H2O.  She is having large amount abount airleaks and some residual apneas. She has tried several different CPAP masks except for full face mask. She prefers nasal pillow mask. Chin strap comes off. She has a side sleeper pillow. She got braces last week. Her dog chewed up power code, she was able to find a replacement cord locally.   Airview download 04/19/22-05/18/22 Usage 24/40 days; 70% > 4 hours Average usage 4 hours 51 mins Pressure 5-15cm h20 (14.7cm h20-05%) Airleaks 32.9L/min (95%) AHI  10.9   06/17/2022 - interim hx  Patient presents today for follow-up OSA. During last visit patient was having residual apneas on pressure setting 5-15cm h20. We adjust pressure to 10-20cm h20 and asked patient be provided full face mask.             Allergies  Allergen Reactions   Other Swelling    Bees/Wasps- redness    Immunization History  Administered Date(s) Administered   Influenza Split 04/28/2012   Janssen (J&J) SARS-COV-2 Vaccination 04/24/2020   Tdap 01/25/2019    Past Medical History:  Diagnosis Date   Anemia    Anxiety    Asthma    Back pain    Bilateral swelling of feet    Depression    Gallbladder problem    GERD (gastroesophageal reflux disease)    High cholesterol    History of swelling of feet    Hypertension    IBS (irritable bowel syndrome)    Joint pain    Obesity    Sinus trouble    Sleep apnea    SOB (shortness of breath)    Vitamin B12 deficiency    Vitamin D deficiency     Tobacco History: Social History   Tobacco Use  Smoking Status Never   Passive exposure: Past  Smokeless Tobacco Never  Tobacco Comments   NEVER SMOKER:  EXPOSED TO 2ND(HUSBAND) X SEVERAL YEARS.    Counseling given: Not Answered Tobacco comments: NEVER SMOKER:  EXPOSED TO 2ND(HUSBAND) X SEVERAL YEARS.    Outpatient Medications Prior to Visit  Medication Sig Dispense Refill   acetaminophen (TYLENOL) 325 MG tablet Take 650 mg by mouth every 6 (six) hours as needed.     albuterol (VENTOLIN HFA) 108 (90 Base) MCG/ACT inhaler TAKE 2 PUFFS BY MOUTH EVERY 6 HOURS AS NEEDED FOR WHEEZE OR SHORTNESS OF BREATH 8.5 each 0   amLODipine (NORVASC) 5 MG tablet Take 1 tablet (5 mg total) by mouth daily. for blood pressure. 90 tablet 1   Aspirin-Acetaminophen-Caffeine (EXCEDRIN PO) Take by mouth.     busPIRone (BUSPAR) 5 MG tablet TAKE 1 TABLET (5 MG TOTAL) BY MOUTH 2 (TWO) TIMES DAILY FOR ANXIETY. 180 tablet 0   famotidine (PEPCID) 20 MG tablet One at bedtime 30 tablet  11   Insulin Pen Needle (PEN NEEDLES) 31G X 6 MM MISC Use daily with Saxenda 100 each 0   Liraglutide -Weight Management (SAXENDA) 18 MG/3ML SOPN Inject 0.6 mg into the skin daily for 1 week, then increase to 1.2 mg into the skin daily thereafter. (Patient not taking: Reported on 05/15/2022) 15 mL 0   losartan (COZAAR) 25 MG tablet Take 1 tablet (25 mg total) by mouth daily. for blood pressure. 90 tablet 1   methocarbamol (ROBAXIN) 500 MG tablet Take 500 mg by mouth 3 (three) times daily.     methylPREDNISolone (MEDROL DOSEPAK) 4 MG TBPK tablet Take by mouth.     mometasone-formoterol (DULERA) 100-5 MCG/ACT AERO Inhale 2 puffs into the lungs  2 (two) times daily. 13 g 5   PARoxetine (PAXIL) 20 MG tablet Take 1 tablet (20 mg total) by mouth daily. For anxiety 90 tablet 0   rosuvastatin (CRESTOR) 10 MG tablet TAKE 1 TABLET BY MOUTH DAILY. FOR CHOLESTEROL 90 tablet 1   SUMAtriptan (IMITREX) 50 MG tablet TAKE 1 TABLET MY MOUTH AT MIGRAINE ONSET. MAY REPEAT IN 2 HOURS IF HEADACHE PERSISTS OR RECURS. 10 tablet 5   triamcinolone (NASACORT AQ) 55 MCG/ACT nasal inhaler Place 2 sprays into the nose daily. 1 Inhaler 0   Vitamin D, Ergocalciferol, (DRISDOL) 1.25 MG (50000 UNIT) CAPS capsule Take 1 capsule (50,000 Units total) by mouth every 7 (seven) days. 13 capsule 0   No facility-administered medications prior to visit.      Review of Systems  Review of Systems   Physical Exam  There were no vitals taken for this visit. Physical Exam   Lab Results:  CBC    Component Value Date/Time   WBC 14.5 (H) 05/28/2022 1416   WBC 11.1 (H) 08/06/2021 1746   RBC 4.50 05/28/2022 1416   RBC 4.42 08/06/2021 1746   HGB 13.4 05/28/2022 1416   HCT 40.1 05/28/2022 1416   PLT 321 08/06/2021 1746   MCV 89 05/28/2022 1416   MCH 29.8 05/28/2022 1416   MCH 30.8 08/06/2021 1746   MCHC 33.4 05/28/2022 1416   MCHC 33.1 08/06/2021 1746   RDW 13.1 05/28/2022 1416   LYMPHSABS 2.5 05/28/2022 1416   MONOABS 0.7  12/19/2016 1654   EOSABS 0.0 05/28/2022 1416   BASOSABS 0.0 05/28/2022 1416    BMET    Component Value Date/Time   NA 140 05/28/2022 1416   K 4.6 05/28/2022 1416   CL 100 05/28/2022 1416   CO2 21 05/28/2022 1416   GLUCOSE 91 05/28/2022 1416   GLUCOSE 91 08/21/2021 0832   BUN 13 05/28/2022 1416   CREATININE 0.68 05/28/2022 1416   CALCIUM 9.4 05/28/2022 1416   GFRNONAA >60 08/06/2021 1746   GFRAA 100 01/27/2019 1204    BNP No results found for: "BNP"  ProBNP    Component Value Date/Time   PROBNP 33.0 01/25/2020 1455    Imaging: No results found.   Assessment & Plan:   No problem-specific Assessment & Plan notes found for this encounter.     Glenford Bayley, NP 06/16/2022

## 2022-06-17 ENCOUNTER — Ambulatory Visit: Payer: 59 | Admitting: Primary Care

## 2022-06-17 ENCOUNTER — Telehealth (INDEPENDENT_AMBULATORY_CARE_PROVIDER_SITE_OTHER): Payer: 59 | Admitting: Psychology

## 2022-06-17 DIAGNOSIS — F32A Depression, unspecified: Secondary | ICD-10-CM

## 2022-06-17 DIAGNOSIS — F5089 Other specified eating disorder: Secondary | ICD-10-CM

## 2022-06-17 DIAGNOSIS — F419 Anxiety disorder, unspecified: Secondary | ICD-10-CM | POA: Diagnosis not present

## 2022-06-17 NOTE — Progress Notes (Signed)
  Office: [phone]  /  Fax: [phone]    Date: June 17, 2022    Appointment Start Time: 10:01am Duration: 31 minutes Provider: Lawerance Cruel, Psy.D. Type of Session: Individual Therapy  Location of Patient: Work (private location) Location of Provider: Provider's Home (private office) Type of Contact: Telepsychological Visit via MyChart Video Visit  Session Content: Amanda Dixon is a 56 y.o. female presenting for a follow-up appointment to address the previously established treatment goal of increasing coping skills.Today's appointment was a telepsychological visit. Amanda Dixon provided verbal consent for today's telepsychological appointment and she is aware she is responsible for securing confidentiality on her end of the session. Prior to proceeding with today's appointment, Amanda Dixon's physical location at the time of this appointment was obtained as well a phone number she could be reached at in the event of technical difficulties. [name] and this provider participated in today's telepsychological service.   This provider conducted a brief check-in. [name] reported, "There's so much going on, I don't have much time for anything." She indicated feeling overwhelmed, noting her Paxil dose was reportedly decreased due to weight gain concerns. This provider recommended longer-term therapeutic services due to ongoing stressors and discussed options to establish care with a new provider. Amanda Dixon provided verbal consent for this provider to place a referral with [facility]. Given ongoing stressors making it challenging to eat congruent to her structured meal plan, she was engaged in problem solving to help her make better choices and engage in portion control. Additionally, psychoeducation was provided regarding all or nothing thinking. Overall, Amanda Dixon was receptive to today's appointment as evidenced by openness to sharing, responsiveness to feedback, and willingness to implement discussed strategies  .  Mental Status Examination:  Appearance: neat Behavior: appropriate to circumstances Mood: neutral Affect: mood congruent Speech: WNL Eye Contact: appropriate Psychomotor Activity: WNL Gait: unable to assess Thought Process: linear, logical, and goal directed and no evidence or endorsement of suicidal, homicidal, and self-harm ideation, plan and intent  Thought Content/Perception: no hallucinations, delusions, bizarre thinking or behavior endorsed or observed Orientation: AAOx4 Memory/Concentration: memory, attention, language, and fund of knowledge intact  Insight: fair Judgment: fair  Interventions:  Conducted a brief chart review Provided empathic reflections and validation Employed supportive psychotherapy interventions to facilitate reduced distress and to improve coping skills with identified stressors Engaged patient in problem solving Recommended/discussed options for longer-term therapeutic services  DSM-5 Diagnosis(es):  F50.89 Other Specified Feeding or Eating Disorder, Emotional Eating Behaviors, F41.9 Unspecified Anxiety Disorder, and  F32.A Unspecified Depressive Disorder  Treatment Goal & Progress: During the initial appointment with this provider, the following treatment goal was established: increase coping skills. Progress is limited, as Amanda Dixon has just begun treatment with this provider; however, she is receptive to the interaction and interventions and rapport is being established.   Plan: The next appointment is scheduled for 07/01/2022 at 8:30am, which will be via MyChart Video Visit. The next session will focus on working towards the established treatment goal. Additionally, she provided verbal consent for this provider to place a referral with [facility].

## 2022-06-21 ENCOUNTER — Other Ambulatory Visit: Payer: Self-pay | Admitting: Primary Care

## 2022-06-21 DIAGNOSIS — I10 Essential (primary) hypertension: Secondary | ICD-10-CM

## 2022-06-23 ENCOUNTER — Other Ambulatory Visit: Payer: Self-pay | Admitting: Primary Care

## 2022-06-23 NOTE — Telephone Encounter (Signed)
Is she still using the Saxenda daily for weight loss? I know [facility].

## 2022-06-24 ENCOUNTER — Encounter (INDEPENDENT_AMBULATORY_CARE_PROVIDER_SITE_OTHER): Payer: Self-pay | Admitting: Internal Medicine

## 2022-06-24 ENCOUNTER — Ambulatory Visit (INDEPENDENT_AMBULATORY_CARE_PROVIDER_SITE_OTHER): Payer: 59 | Admitting: Internal Medicine

## 2022-06-24 VITALS — BP 145/78 | HR 85 | Temp 98.6°F | Ht 65.0 in | Wt 244.6 lb

## 2022-06-24 DIAGNOSIS — E669 Obesity, unspecified: Secondary | ICD-10-CM | POA: Diagnosis not present

## 2022-06-24 DIAGNOSIS — I10 Essential (primary) hypertension: Secondary | ICD-10-CM | POA: Diagnosis not present

## 2022-06-24 DIAGNOSIS — Z6841 Body Mass Index (BMI) 40.0 and over, adult: Secondary | ICD-10-CM | POA: Diagnosis not present

## 2022-06-24 DIAGNOSIS — R7303 Prediabetes: Secondary | ICD-10-CM

## 2022-06-24 NOTE — Telephone Encounter (Signed)
Noted  

## 2022-06-24 NOTE — Telephone Encounter (Signed)
Spoke with patient, she is not using Saxenda or any other weight loss medication at this time. She was not able to get anything from the pharmacy. She does not need the pen needles refilled.

## 2022-07-01 ENCOUNTER — Telehealth (INDEPENDENT_AMBULATORY_CARE_PROVIDER_SITE_OTHER): Payer: 59 | Admitting: Psychology

## 2022-07-01 DIAGNOSIS — F419 Anxiety disorder, unspecified: Secondary | ICD-10-CM | POA: Diagnosis not present

## 2022-07-01 DIAGNOSIS — F5089 Other specified eating disorder: Secondary | ICD-10-CM | POA: Diagnosis not present

## 2022-07-01 DIAGNOSIS — F32A Depression, unspecified: Secondary | ICD-10-CM

## 2022-07-01 NOTE — Progress Notes (Signed)
  Office: [phone]  /  Fax: [phone]    Date: July 01, 2022    Appointment Start Time: 8:31am Duration: 27 minutes Provider: Lawerance Cruel, Psy.D. Type of Session: Individual Therapy  Location of Patient: Home (private location) Location of Provider: Provider's Home (private office) Type of Contact: Telepsychological Visit via MyChart Video Visit  Session Content: Amanda Dixon is a 56 y.o. female presenting for a follow-up appointment to address the previously established treatment goal of increasing coping skills.Today's appointment was a telepsychological visit. Amanda Dixon provided verbal consent for today's telepsychological appointment and she is aware she is responsible for securing confidentiality on her end of the session. Prior to proceeding with today's appointment, Amanda Dixon's physical location at the time of this appointment was obtained as well a phone number she could be reached at in the event of technical difficulties. Amanda Dixon and this provider participated in today's telepsychological service.   This provider conducted a brief check-in. Amanda Dixon shared about recent events, including "doubt[ing]" herself due to ongoing stress resulting in a desire to increase her Paxil prescription. She plans to speak to her prescribing provider. Amanda Dixon reflected that not feeling well this past weekend resulted in her having to take a break, which led to an improved mood. As such, psychoeducation regarding the importance of self-care utilizing the oxygen mask metaphor was provided. Psychoeducation regarding pleasurable activities, including its impact on emotional eating and overall well-being was also provided. Amanda Dixon was provided with a handout with various options of pleasurable activities, and was encouraged to engage in one activity a day and additional activities as needed when triggered to emotionally eat. Amanda Dixon agreed. Amanda Dixon provided verbal consent during today's appointment for this provider to send a handout with  pleasurable activities via e-mail. Amanda Dixon was receptive to today's appointment as evidenced by openness to sharing, responsiveness to feedback, and willingness to engage in pleasurable activities to assist with coping.  Mental Status Examination:  Appearance: neat Behavior: appropriate to circumstances Mood: anxious Affect: mood congruent Speech: WNL Eye Contact: appropriate Psychomotor Activity: WNL Gait: unable to assess Thought Process: linear, logical, and goal directed and no evidence or endorsement of suicidal, homicidal, and self-harm ideation, plan and intent  Thought Content/Perception: no hallucinations, delusions, bizarre thinking or behavior endorsed or observed Orientation: AAOx4 Memory/Concentration: memory, attention, language, and fund of knowledge intact  Insight: fair Judgment: fair  Interventions:  Conducted a brief chart review Provided empathic reflections and validation Employed supportive psychotherapy interventions to facilitate reduced distress and to improve coping skills with identified stressors Psychoeducation provided regarding pleasurable activities Psychoeducation provided regarding self-care  DSM-5 Diagnosis(es):  F50.89 Other Specified Feeding or Eating Disorder, Emotional Eating Behaviors, F41.9 Unspecified Anxiety Disorder, and  F32.A Unspecified Depressive Disorder  Treatment Goal & Progress: During the initial appointment with this provider, the following treatment goal was established: increase coping skills. Amanda Dixon has demonstrated progress in her goal as evidenced by willingness to engage in pleasurable activities to assist with coping.  Plan: The next appointment is scheduled for 07/15/2022 at 8:30am, which will be via MyChart Video Visit. The next session will focus on working towards the established treatment goal. Amanda Dixon stated she is in the process of establishing care with [facility].

## 2022-07-02 ENCOUNTER — Encounter: Payer: 59 | Admitting: Primary Care

## 2022-07-03 NOTE — Progress Notes (Signed)
Chief Complaint:   OBESITY Amanda Dixon is here to discuss her progress with her obesity treatment plan along with follow-up of her obesity related diagnoses. Amanda Dixon is on the Category 3 Plan and states she is following her eating plan approximately 0% of the time. Amanda Dixon states she is doing 0 minutes 0 times per week.  Today's visit was #: 3 Starting weight: 247 lbs Starting date: 05/14/2022 Today's weight: 244 lbs Today's date: 06/24/2022 Total lbs lost to date: 3 Total lbs lost since last in-office visit: 1  Interim History: Amanda Dixon has not been able to make changes. She has a busy life, many responsibilities, and work related stress/demands that interfere with her personal goals.  She acknowledges that she is not prioritizing her health and being easily side-tracked. She has met with behavioral health specialist and she has now been referred for counseling, but not established yet.   Subjective:   1. Pre-diabetes Amanda Dixon's last A1c was 5.9. She was counseled on the risk of progression. I discussed labs with the patient today.   2. Hypertension, essential Amanda Dixon's blood pressure has improved, but not at goal.  Contributing factors are obstructive sleep apnea and stress.  Assessment/Plan:   1. Pre-diabetes Amanda Dixon will continue working on her weight loss therapy and increase her physical activity.   2. Hypertension, essential Amanda Dixon was counseled on monitoring her blood pressure at home. Goal blood pressure is <130/80. She is to contact her PCP if her blood pressure remains above this target.  We counseled on the risk associated with untreated blood pressure.    3. Obesity current BMI 40.7 Amanda Dixon is currently in the action stage of change. As such, her goal is to continue with weight loss efforts. She has agreed to the Category 3 Plan.   Amanda Dixon appears to be in the contemplation stage.  There is dissonance between desired health goals and priorities.  She has not been able to make changes or implement  the meal plan. She was counseled on cognitive restructuring, thinking of her "why" and to committing and prioritizing her healthcare needs.   Exercise goals: Contemplating exercise program.   Behavioral modification strategies: increasing lean protein intake, keeping healthy foods in the home, and planning for success.  Amanda Dixon has agreed to follow-up with our clinic in 2 to 3 weeks. She was informed of the importance of frequent follow-up visits to maximize her success with intensive lifestyle modifications for her multiple health conditions.   Objective:   Blood pressure (!) 145/78, pulse 85, temperature 98.6 F (37 C), height _0  (1.651 m), weight 244 lb 9.6 oz (110.9 kg), SpO2 98 %. Body mass index is 40.7 kg/m.  General: Cooperative, alert, well developed, in no acute distress. HEENT: Conjunctivae and lids unremarkable. Cardiovascular: Regular rhythm.  Lungs: Normal work of breathing. Neurologic: No focal deficits.   Lab Results  Component Value Date   CREATININE 0.68 05/28/2022   BUN 13 05/28/2022   NA 140 05/28/2022   K 4.6 05/28/2022   CL 100 05/28/2022   CO2 21 05/28/2022   Lab Results  Component Value Date   ALT 13 05/28/2022   AST 21 05/28/2022   ALKPHOS 73 05/28/2022   BILITOT 0.2 05/28/2022   Lab Results  Component Value Date   HGBA1C 5.9 (H) 05/28/2022   HGBA1C 5.9 12/21/2021   HGBA1C 5.7 06/01/2021   HGBA1C 5.7 01/25/2020   HGBA1C 5.5 01/27/2019   Lab Results  Component Value Date   INSULIN 24.3 05/14/2022  Lab Results  Component Value Date   TSH 2.340 05/28/2022   Lab Results  Component Value Date   CHOL 174 05/14/2022   HDL 48 05/14/2022   LDLCALC 100 (H) 05/14/2022   TRIG 151 (H) 05/14/2022   CHOLHDL 3 07/10/2021   Lab Results  Component Value Date   VD25OH 27.1 (L) 05/28/2022   Lab Results  Component Value Date   WBC 14.5 (H) 05/28/2022   HGB 13.4 05/28/2022   HCT 40.1 05/28/2022   MCV 89 05/28/2022   PLT 321 08/06/2021   No  results found for: "IRON", "TIBC", "FERRITIN"  Attestation Statements:   Reviewed by clinician on day of visit: allergies, medications, problem list, medical history, surgical history, family history, social history, and previous encounter notes.  Time spent on visit including pre-visit chart review and post-visit care and charting was 20 minutes.   Wilhemena Durie, am acting as transcriptionist for Thomes Dinning, MD.  I have reviewed the above documentation for accuracy and completeness, and I agree with the above. -Thomes Dinning, MD

## 2022-07-05 ENCOUNTER — Other Ambulatory Visit: Payer: Self-pay | Admitting: Primary Care

## 2022-07-05 DIAGNOSIS — Z1231 Encounter for screening mammogram for malignant neoplasm of breast: Secondary | ICD-10-CM

## 2022-07-08 ENCOUNTER — Ambulatory Visit (INDEPENDENT_AMBULATORY_CARE_PROVIDER_SITE_OTHER): Payer: 59 | Admitting: Primary Care

## 2022-07-08 ENCOUNTER — Encounter: Payer: Self-pay | Admitting: Primary Care

## 2022-07-08 VITALS — BP 156/82 | HR 80 | Temp 97.9°F | Ht 65.0 in | Wt 242.0 lb

## 2022-07-08 DIAGNOSIS — J4530 Mild persistent asthma, uncomplicated: Secondary | ICD-10-CM

## 2022-07-08 DIAGNOSIS — E785 Hyperlipidemia, unspecified: Secondary | ICD-10-CM

## 2022-07-08 DIAGNOSIS — R7303 Prediabetes: Secondary | ICD-10-CM | POA: Diagnosis not present

## 2022-07-08 DIAGNOSIS — Z0001 Encounter for general adult medical examination with abnormal findings: Secondary | ICD-10-CM

## 2022-07-08 DIAGNOSIS — I10 Essential (primary) hypertension: Secondary | ICD-10-CM

## 2022-07-08 DIAGNOSIS — G8929 Other chronic pain: Secondary | ICD-10-CM

## 2022-07-08 DIAGNOSIS — G43709 Chronic migraine without aura, not intractable, without status migrainosus: Secondary | ICD-10-CM

## 2022-07-08 DIAGNOSIS — M545 Low back pain, unspecified: Secondary | ICD-10-CM

## 2022-07-08 DIAGNOSIS — R051 Acute cough: Secondary | ICD-10-CM | POA: Insufficient documentation

## 2022-07-08 DIAGNOSIS — G4730 Sleep apnea, unspecified: Secondary | ICD-10-CM

## 2022-07-08 DIAGNOSIS — F411 Generalized anxiety disorder: Secondary | ICD-10-CM

## 2022-07-08 MED ORDER — AZITHROMYCIN 250 MG PO TABS: ORAL_TABLET | ORAL | 0 refills | Status: DC

## 2022-07-08 MED ORDER — BENZONATATE 200 MG PO CAPS: 200.0000 mg | ORAL_CAPSULE | Freq: Three times a day (TID) | ORAL | 0 refills | Status: DC | PRN

## 2022-07-08 NOTE — Progress Notes (Signed)
Subjective:    Patient ID: Amanda Dixon, female    DOB: 1966/03/01, 56 y.o.   MRN: [id number]  HPI  Amanda Dixon is a very pleasant 56 y.o. female who presents today for complete physical and follow up of chronic conditions.  She would also like to mention cough. Acute, deep, congested cough x 3 weeks. Also with fatigue. She's taken numerous medications OTC without improvement. She's taken 3 Covid-19 tests, all of which are negative. She's feeling run down, fatigued, not herself. She's had to use her albuterol inhaler more frequently.  Immunizations: -Tetanus: 2020 -Influenza: Has not completed  -Shingles: Never completed  Diet: Fair diet.  Exercise: No regular exercise.  Eye exam: Completes annually  Dental exam: Completes semi-annually   Pap Smear: Completed in 2020, hysterectomy Mammogram: Completed in December 2022, scheduled for December 2023  Colonoscopy: Completed around the age of 56 per patient, we have no records. She thinks she's due 10 years from her date.   BP Readings from Last 3 Encounters:  07/08/22 (!) 156/82  06/24/22 (!) 145/78  05/28/22 (!) 136/92        Review of Systems  Constitutional:  Positive for fatigue. Negative for fever and unexpected weight change.  HENT:  Positive for congestion, postnasal drip and rhinorrhea.   Respiratory:  Positive for cough. Negative for shortness of breath.   Cardiovascular:  Negative for chest pain.  Gastrointestinal:  Negative for constipation and diarrhea.  Genitourinary:  Negative for difficulty urinating.  Musculoskeletal:  Negative for arthralgias and myalgias.  Skin:  Negative for rash.  Allergic/Immunologic: Positive for environmental allergies.  Neurological:  Negative for dizziness, numbness and headaches.  Psychiatric/Behavioral:  The patient is not nervous/anxious.          Past Medical History:  Diagnosis Date   Anemia    Anxiety    Asthma    Back pain    Bilateral swelling of feet     Depression    Epistaxis 01/24/2022   Gallbladder problem    GERD (gastroesophageal reflux disease)    High cholesterol    History of swelling of feet    Hypertension    IBS (irritable bowel syndrome)    Joint pain    Obesity    Sinus trouble    Sleep apnea    SOB (shortness of breath)    Vitamin B12 deficiency    Vitamin D deficiency     Social History   Socioeconomic History   Marital status: Married    Spouse name: Not on file   Number of children: Not on file   Years of education: Not on file   Highest education level: Not on file  Occupational History   Occupation: Research scientist (medical)executive assistant  Tobacco Use   Smoking status: Never    Passive exposure: Past   Smokeless tobacco: Never   Tobacco comments:    NEVER SMOKER:  EXPOSED TO 2ND(HUSBAND) X SEVERAL YEARS.   Vaping Use   Vaping Use: Never used  Substance and Sexual Activity   Alcohol use: No   Drug use: No   Sexual activity: Yes    Partners: Male    Birth control/protection: Surgical    Comment: Hysterectomy   Other Topics Concern   Not on file  Social History Narrative   Not on file   Social Determinants of Health   Financial Resource Strain: Not on file  Food Insecurity: Not on file  Transportation Needs: Not on file  Physical Activity: Not  on file  Stress: Not on file  Social Connections: Not on file  Intimate Partner Violence: Not on file    Past Surgical History:  Procedure Laterality Date   ABDOMINAL HYSTERECTOMY  2008   supracervical   CESAREAN SECTION  1990   CHOLECYSTECTOMY  1996/97?   TONSILLECTOMY      Family History  Problem Relation Age of Onset   Allergies Mother    High Cholesterol Mother    Obesity Mother    Heart disease Father        multpile bypass   Hypertension Father    High Cholesterol Father    Sudden death Father    Obesity Father    Allergies Sister    Diabetes Sister    Asthma Sister    Eczema Sister    Cancer - Cervical Sister    Endometriosis Sister     Allergies Son    Asthma Maternal Aunt    Diabetes Paternal Aunt    Diabetes Paternal Uncle    Heart disease Maternal Grandfather    Emphysema Maternal Grandfather    Asthma Maternal Grandfather    Emphysema Paternal Grandmother    Diabetes Paternal Grandmother    Heart disease Paternal Grandfather    Allergies Other        mothers side   Asthma Other        father side   Breast cancer Neg Hx     Allergies  Allergen Reactions   Other Swelling    Bees/Wasps- redness    Current Outpatient Medications on File Prior to Visit  Medication Sig Dispense Refill   acetaminophen (TYLENOL) 325 MG tablet Take 650 mg by mouth every 6 (six) hours as needed.     albuterol (VENTOLIN HFA) 108 (90 Base) MCG/ACT inhaler TAKE 2 PUFFS BY MOUTH EVERY 6 HOURS AS NEEDED FOR WHEEZE OR SHORTNESS OF BREATH 8.5 each 0   Aspirin-Acetaminophen-Caffeine (EXCEDRIN PO) Take by mouth.     busPIRone (BUSPAR) 5 MG tablet TAKE 1 TABLET (5 MG TOTAL) BY MOUTH 2 (TWO) TIMES DAILY FOR ANXIETY. 180 tablet 0   famotidine (PEPCID) 20 MG tablet One at bedtime 30 tablet 11   Insulin Pen Needle (PEN NEEDLES) 31G X 6 MM MISC Use daily with Saxenda 100 each 0   losartan (COZAAR) 25 MG tablet TAKE 1 TABLET BY MOUTH EVERY DAY FOR BLOOD PRESSURE 90 tablet 2   methocarbamol (ROBAXIN) 500 MG tablet Take 500 mg by mouth 3 (three) times daily.     mometasone-formoterol (DULERA) 100-5 MCG/ACT AERO Inhale 2 puffs into the lungs 2 (two) times daily. 13 g 5   PARoxetine (PAXIL) 20 MG tablet Take 1 tablet (20 mg total) by mouth daily. For anxiety 90 tablet 0   rosuvastatin (CRESTOR) 10 MG tablet TAKE 1 TABLET BY MOUTH DAILY. FOR CHOLESTEROL 90 tablet 1   SUMAtriptan (IMITREX) 50 MG tablet TAKE 1 TABLET MY MOUTH AT MIGRAINE ONSET. MAY REPEAT IN 2 HOURS IF HEADACHE PERSISTS OR RECURS. 10 tablet 5   triamcinolone (NASACORT AQ) 55 MCG/ACT nasal inhaler Place 2 sprays into the nose daily. 1 Inhaler 0   Vitamin D, Ergocalciferol, (DRISDOL) 1.25  MG (50000 UNIT) CAPS capsule Take 1 capsule (50,000 Units total) by mouth every 7 (seven) days. 13 capsule 0   No current facility-administered medications on file prior to visit.    BP (!) 156/82   Pulse 80   Temp 97.9 F (36.6 C) (Temporal)   Ht 5\' 5"  (1.651 m)  Wt 242 lb (109.8 kg)   SpO2 100%   BMI 40.27 kg/m  Objective:   Physical Exam HENT:     Right Ear: Tympanic membrane and ear canal normal.     Left Ear: Tympanic membrane and ear canal normal.     Nose: Nose normal.  Eyes:     Conjunctiva/sclera: Conjunctivae normal.     Pupils: Pupils are equal, round, and reactive to light.  Neck:     Thyroid: No thyromegaly.  Cardiovascular:     Rate and Rhythm: Normal rate and regular rhythm.     Heart sounds: No murmur heard. Pulmonary:     Effort: Pulmonary effort is normal.     Breath sounds: Normal breath sounds.     Comments: Congested, deep, cough noted several times during visit Abdominal:     General: Bowel sounds are normal.     Palpations: Abdomen is soft.     Tenderness: There is no abdominal tenderness.  Musculoskeletal:        General: Normal range of motion.     Cervical back: Neck supple.  Lymphadenopathy:     Cervical: No cervical adenopathy.  Skin:    General: Skin is warm and dry.     Findings: No rash.  Neurological:     Mental Status: She is alert and oriented to person, place, and time.     Cranial Nerves: No cranial nerve deficit.     Deep Tendon Reflexes: Reflexes are normal and symmetric.  Psychiatric:        Mood and Affect: Mood normal.           Assessment & Plan:   Problem List Items Addressed This Visit       Cardiovascular and Mediastinum   Chronic migraine without aura without status migrainosus, not intractable    Controlled.  Continue sumatriptan 50 mg PRN.       Primary hypertension    Above goal today, home readings are well controlled in the 110's-120's/70's.  Continue losartan 25 mg daily.  CMP reviewed from  October 2023.        Respiratory   Asthma    Controlled normally.  Continue Dulera 100-5 mcg, 2 puffs BID everyday. Continue albuterol inhaler PRN.      Severe sleep apnea    Following with pulmonology. Resume CPAP nightly, she has been without CPAP due to recent URI illness.         Other   Obesity, Class III, BMI 40-49.9 (morbid obesity) (HCC)    [facility], reviewed labs from October 2023      GAD (generalized anxiety disorder)    Controlled.  Continue paroxetine 20 mg daily. Continue buspirone 5 mg PRN>      Encounter for annual general medical examination with abnormal findings in adult - Primary    Due for Shingrix and influenza vaccines, declines today given acute illness. Mammogram scheduled. Colonoscopy due around 2027.  Discussed the importance of a healthy diet and regular exercise in order for weight loss, and to reduce the risk of further co-morbidity.  Exam stable. Labs pending.  Follow up in 1 year for repeat physical.       Hyperlipidemia    Continue Rosuvastatin 10 mg daily. Reviewed lipid panel from October 2023.      Prediabetes    Reviewed labs from October 2023. A1C stable at 5.9.  [facility]       Chronic back  pain    Overall stable. Continue methocarbamol 500 mg PRN and Meloxicam 15 mg PRN. Not using often.      Acute cough    Symptoms and presentation suspicous for bacterial cause at this point. Given duration of symptoms, coupled with deep.congested cough today, will treat.  Start Azithromycin antibiotics for infection. Take 2 tablets by mouth today, then 1 tablet daily for 4 additional days. Start [employer]. Take 1 capsule by mouth three times daily as needed for cough.   Follow up PRN.      Relevant Medications   azithromycin (ZITHROMAX) 250 MG tablet   benzonatate (TESSALON) 200 MG capsule       Doreene Nest, NP

## 2022-07-08 NOTE — Assessment & Plan Note (Signed)
Controlled normally.  Continue Dulera 100-5 mcg, 2 puffs BID everyday. Continue albuterol inhaler PRN.

## 2022-07-08 NOTE — Assessment & Plan Note (Signed)
Controlled.  Continue sumatriptan 50 mg PRN.

## 2022-07-08 NOTE — Assessment & Plan Note (Signed)
Due for Shingrix and influenza vaccines, declines today given acute illness. Mammogram scheduled. Colonoscopy due around 2027.  Discussed the importance of a healthy diet and regular exercise in order for weight loss, and to reduce the risk of further co-morbidity.  Exam stable. Labs pending.  Follow up in 1 year for repeat physical.

## 2022-07-08 NOTE — Assessment & Plan Note (Signed)
Following with pulmonology. Resume CPAP nightly, she has been without CPAP due to recent URI illness.

## 2022-07-08 NOTE — Assessment & Plan Note (Signed)
Overall stable. Continue methocarbamol 500 mg PRN and Meloxicam 15 mg PRN. Not using often.

## 2022-07-08 NOTE — Patient Instructions (Signed)
Start Azithromycin antibiotics for infection. Take 2 tablets by mouth today, then 1 tablet daily for 4 additional days.  You may take Benzonatate capsules for cough. Take 1 capsule by mouth three times daily as needed for cough.  It was a pleasure to see you today!  Preventive Care 22-56 Years Old, Female Preventive care refers to lifestyle choices and visits with your health care provider that can promote health and wellness. Preventive care visits are also called wellness exams. What can I expect for my preventive care visit? Counseling Your health care provider may ask you questions about your: Medical history, including: Past medical problems. Family medical history. Pregnancy history. Current health, including: Menstrual cycle. Method of birth control. Emotional well-being. Home life and relationship well-being. Sexual activity and sexual health. Lifestyle, including: Alcohol, nicotine or tobacco, and drug use. Access to firearms. Diet, exercise, and sleep habits. Work and work Statistician. Sunscreen use. Safety issues such as seatbelt and bike helmet use. Physical exam Your health care provider will check your: Height and weight. These may be used to calculate your BMI (body mass index). BMI is a measurement that tells if you are at a healthy weight. Waist circumference. This measures the distance around your waistline. This measurement also tells if you are at a healthy weight and may help predict your risk of certain diseases, such as type 2 diabetes and high blood pressure. Heart rate and blood pressure. Body temperature. Skin for abnormal spots. What immunizations do I need?  Vaccines are usually given at various ages, according to a schedule. Your health care provider will recommend vaccines for you based on your age, medical history, and lifestyle or other factors, such as travel or where you work. What tests do I need? Screening Your health care provider may  recommend screening tests for certain conditions. This may include: Lipid and cholesterol levels. Diabetes screening. This is done by checking your blood sugar (glucose) after you have not eaten for a while (fasting). Pelvic exam and Pap test. Hepatitis B test. Hepatitis C test. HIV (human immunodeficiency virus) test. STI (sexually transmitted infection) testing, if you are at risk. Lung cancer screening. Colorectal cancer screening. Mammogram. Talk with your health care provider about when you should start having regular mammograms. This may depend on whether you have a family history of breast cancer. BRCA-related cancer screening. This may be done if you have a family history of breast, ovarian, tubal, or peritoneal cancers. Bone density scan. This is done to screen for osteoporosis. Talk with your health care provider about your test results, treatment options, and if necessary, the need for more tests. Follow these instructions at home: Eating and drinking  Eat a diet that includes fresh fruits and vegetables, whole grains, lean protein, and low-fat dairy products. Take vitamin and mineral supplements as recommended by your health care provider. Do not drink alcohol if: Your health care provider tells you not to drink. You are pregnant, may be pregnant, or are planning to become pregnant. If you drink alcohol: Limit how much you have to 0-1 drink a day. Know how much alcohol is in your drink. In the [place] one drink equals one 12 oz bottle of beer (355 mL), one 5 oz glass of wine (148 mL), or one 1 oz glass of hard liquor (44 mL). Lifestyle Brush your teeth every morning and night with fluoride toothpaste. Floss one time each day. Exercise for at least 30 minutes 5 or more days each week. Do not use any  products that contain nicotine or tobacco. These products include cigarettes, chewing tobacco, and vaping devices, such as e-cigarettes. If you need help quitting, ask your health  care provider. Do not use drugs. If you are sexually active, practice safe sex. Use a condom or other form of protection to prevent STIs. If you do not wish to become pregnant, use a form of birth control. If you plan to become pregnant, see your health care provider for a prepregnancy visit. Take aspirin only as told by your health care provider. Make sure that you understand how much to take and what form to take. Work with your health care provider to find out whether it is safe and beneficial for you to take aspirin daily. Find healthy ways to manage stress, such as: Meditation, yoga, or listening to music. Journaling. Talking to a trusted person. Spending time with friends and family. Minimize exposure to UV radiation to reduce your risk of skin cancer. Safety Always wear your seat belt while driving or riding in a vehicle. Do not drive: If you have been drinking alcohol. Do not ride with someone who has been drinking. When you are tired or distracted. While texting. If you have been using any mind-altering substances or drugs. Wear a helmet and other protective equipment during sports activities. If you have firearms in your house, make sure you follow all gun safety procedures. Seek help if you have been physically or sexually abused. What's next? Visit your health care provider once a year for an annual wellness visit. Ask your health care provider how often you should have your eyes and teeth checked. Stay up to date on all vaccines. This information is not intended to replace advice given to you by your health care provider. Make sure you discuss any questions you have with your health care provider. Document Revised: 01/10/2021 Document Reviewed: 01/10/2021 Elsevier Patient Education  [employer].

## 2022-07-08 NOTE — Assessment & Plan Note (Signed)
[facility], reviewed labs from October 2023

## 2022-07-08 NOTE — Assessment & Plan Note (Signed)
Controlled.  Continue paroxetine 20 mg daily. Continue buspirone 5 mg PRN>

## 2022-07-08 NOTE — Assessment & Plan Note (Signed)
Continue Rosuvastatin 10 mg daily. Reviewed lipid panel from October 2023.

## 2022-07-08 NOTE — Assessment & Plan Note (Signed)
Reviewed labs from October 2023. A1C stable at 5.9.  Following [facility]

## 2022-07-08 NOTE — Assessment & Plan Note (Signed)
Above goal today, home readings are well controlled in the 110's-120's/70's.  Continue losartan 25 mg daily.  CMP reviewed from October 2023.

## 2022-07-08 NOTE — Assessment & Plan Note (Signed)
Symptoms and presentation suspicous for bacterial cause at this point. Given duration of symptoms, coupled with deep.congested cough today, will treat.  Start Azithromycin antibiotics for infection. Take 2 tablets by mouth today, then 1 tablet daily for 4 additional days. Start [employer]. Take 1 capsule by mouth three times daily as needed for cough.   Follow up PRN.

## 2022-07-15 ENCOUNTER — Telehealth (INDEPENDENT_AMBULATORY_CARE_PROVIDER_SITE_OTHER): Payer: 59 | Admitting: Psychology

## 2022-07-15 DIAGNOSIS — F5089 Other specified eating disorder: Secondary | ICD-10-CM

## 2022-07-15 DIAGNOSIS — F32A Depression, unspecified: Secondary | ICD-10-CM | POA: Diagnosis not present

## 2022-07-15 DIAGNOSIS — F419 Anxiety disorder, unspecified: Secondary | ICD-10-CM | POA: Diagnosis not present

## 2022-07-15 NOTE — Progress Notes (Signed)
  Office: [phone]  /  Fax: [phone]    Date: July 15, 2022    Appointment Start Time: 8:33am Duration: 31 minutes Provider: Lawerance Cruel, Psy.D. Type of Session: Individual Therapy  Location of Patient: Home (private location) Location of Provider: Provider's Home (private office) Type of Contact: Telepsychological Visit via MyChart Video Visit  Session Content: Amanda Dixon is a 56 y.o. female presenting for a follow-up appointment to address the previously established treatment goal of increasing coping skills.Today's appointment was a telepsychological visit. Amanda Dixon provided verbal consent for today's telepsychological appointment and she is aware she is responsible for securing confidentiality on her end of the session. Prior to proceeding with today's appointment, Amanda Dixon's physical location at the time of this appointment was obtained as well a phone number she could be reached at in the event of technical difficulties. Amanda Dixon and this provider participated in today's telepsychological service.   This provider conducted a brief check-in. Amanda Dixon stated she is "off for the rest of the year." She discussed plans for self-care (e.g., lunch with friends), adding, "I'm in a much better place." This provider and Amanda Dixon explored/discussed the impact of the aforementioned on her eating habits. She described a reduction in emotional eating behaviors, but wondered if it is due to decreased appetite from when she was sick. Psychoeducation regarding triggers for emotional eating was provided. Amanda Dixon was provided a handout, and encouraged to utilize the handout between now and the next appointment to increase awareness of triggers and frequency. Amanda Dixon agreed. This provider also discussed behavioral strategies for specific triggers, such as placing the utensil down when conversing to avoid mindless eating. Amanda Dixon provided verbal consent during today's appointment for this provider to send a handout about triggers via  e-mail. Overall, Amanda Dixon was receptive to today's appointment as evidenced by openness to sharing, responsiveness to feedback, and willingness to explore triggers for emotional eating.  Mental Status Examination:  Appearance: neat Behavior: appropriate to circumstances Mood: neutral Affect: mood congruent Speech: WNL Eye Contact: appropriate Psychomotor Activity: WNL Gait: unable to assess Thought Process: linear, logical, and goal directed and no evidence or endorsement of suicidal, homicidal, and self-harm ideation, plan and intent  Thought Content/Perception: no hallucinations, delusions, bizarre thinking or behavior endorsed or observed Orientation: AAOx4 Memory/Concentration: memory, attention, language, and fund of knowledge intact  Insight: fair Judgment: fair  Interventions:  Conducted a brief chart review Provided empathic reflections and validation Reviewed content from the previous session Provided positive reinforcement Employed supportive psychotherapy interventions to facilitate reduced distress and to improve coping skills with identified stressors Psychoeducation provided regarding triggers for emotional eating behaviors  DSM-5 Diagnosis(es):  F50.89 Other Specified Feeding or Eating Disorder, Emotional Eating Behaviors, F41.9 Unspecified Anxiety Disorder, and  F32.A Unspecified Depressive Disorder  Treatment Goal & Progress: During the initial appointment with this provider, the following treatment goal was established: increase coping skills. Amanda Dixon has demonstrated progress in her goal as evidenced by increased awareness of hunger patterns. Amanda Dixon also continues to demonstrate willingness to engage in learned skill(s).  Plan: The next appointment is scheduled for 08/05/2022 at 8:30am, which will be via MyChart Video Visit. The next session will focus on working towards the established treatment goal. Amanda Dixon will focus on identifying her triggers for emotional eating behaviors  and continue to engage in pleasurable activities. Additionally, Amanda Dixon stated she completed the new patient paperwork with [facility]; she agreed to call and schedule her initial appointment.

## 2022-07-17 ENCOUNTER — Telehealth (INDEPENDENT_AMBULATORY_CARE_PROVIDER_SITE_OTHER): Payer: 59 | Admitting: Primary Care

## 2022-07-17 ENCOUNTER — Encounter: Payer: Self-pay | Admitting: Primary Care

## 2022-07-17 DIAGNOSIS — M5441 Lumbago with sciatica, right side: Secondary | ICD-10-CM

## 2022-07-17 DIAGNOSIS — M545 Low back pain, unspecified: Secondary | ICD-10-CM | POA: Diagnosis not present

## 2022-07-17 DIAGNOSIS — M5442 Lumbago with sciatica, left side: Secondary | ICD-10-CM | POA: Diagnosis not present

## 2022-07-17 DIAGNOSIS — G8929 Other chronic pain: Secondary | ICD-10-CM | POA: Diagnosis not present

## 2022-07-17 HISTORY — DX: Lumbago with sciatica, left side: M54.41

## 2022-07-17 HISTORY — DX: Lumbago with sciatica, left side: M54.42

## 2022-07-17 MED ORDER — CYCLOBENZAPRINE HCL 5 MG PO TABS: 5.0000 mg | ORAL_TABLET | Freq: Three times a day (TID) | ORAL | 0 refills | Status: DC | PRN

## 2022-07-17 MED ORDER — PREDNISONE 20 MG PO TABS: ORAL_TABLET | ORAL | 0 refills | Status: DC

## 2022-07-17 NOTE — Assessment & Plan Note (Signed)
No alarm signs.  Treat current flare with prednisone 60 mg x 3 days, 40 mg x 3 days, 20 mg x 3 days. Start cyclobenzaprine 5-10 mg TID PRN. Drowsiness precautions provided.   Will obtain xray of lumbar spine once able.  Consider referral back to [facility]. She may ultimately need a MRI lumbar spine.

## 2022-07-17 NOTE — Progress Notes (Signed)
Patient ID: Amanda Dixon, female    DOB: 1965/08/29, 56 y.o.   MRN: [id number]  Virtual visit completed through [place], a video enabled telemedicine application. Due to national recommendations of social distancing due to COVID-19, a virtual visit is felt to be most appropriate for this patient at this time. Reviewed limitations, risks, security and privacy concerns of performing a virtual visit and the availability of in person appointments. I also reviewed that there may be a patient responsible charge related to this service. The patient agreed to proceed.   Patient location: home Provider location: [place] [facility], office Persons participating in this virtual visit: patient, provider   If any vitals were documented, they were collected by patient at home unless specified below.    There were no vitals taken for this visit.   CC: Back Pain Subjective:   HPI: Amanda Dixon is a 56 y.o. female presenting on 07/17/2022 for Back Pain (Was getting out of car Monday and got a hitch in back. That evening the pain was going down to her legs. She went to chiropractor yesterday, she was already in severe pain, and after she left she could barely walk, and was still in severe pain. The pain runs down the front and back of her legs and into her feet. )  Symptom onset two days ago while getting out of the car. She felt a sudden "hitch" to her mid back with immediate back pain, overal mild. She took some Advil and used a heating pad, and went to sleep. The following morning she stepped out of bed and her back increased significantly.  Her pain remains to the mid back with bilateral lower extremity pain and numbness and cramping. Down the anterior and posterior legs. She was evaluated by her chiropractor yesterday, no improvement after several adjustments. She continues to experience severe back pain with "intermittent catching and muscle spasms" to her legs.  She denies loss of  bowel/bladder control.   She was treated for the same sort of symptoms about one month ago, had to have muscle relaxer treatment. Evaluated by [facility], unable to collect xrays as patient couldn't stand. She underwent PT through [facility] a few times over the years with temporary improvement.   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      Relevant past medical, surgical, family and social history reviewed and updated as indicated. Interim medical history since our last visit reviewed. Allergies and medications reviewed and updated. Outpatient Medications Prior to Visit  Medication Sig Dispense Refill   acetaminophen (TYLENOL) 325 MG tablet Take 650 mg by mouth every 6 (six) hours as needed.     albuterol (VENTOLIN HFA) 108 (90 Base) MCG/ACT inhaler TAKE 2 PUFFS BY MOUTH EVERY 6 HOURS AS NEEDED FOR WHEEZE OR SHORTNESS OF BREATH 8.5 each 0   Aspirin-Acetaminophen-Caffeine (EXCEDRIN PO) Take by mouth.     busPIRone (BUSPAR) 5 MG tablet TAKE 1 TABLET (5 MG TOTAL) BY MOUTH 2 (TWO) TIMES DAILY FOR ANXIETY. 180 tablet 0   famotidine (PEPCID) 20 MG tablet One at bedtime 30 tablet 11   Insulin Pen Needle (PEN NEEDLES) 31G X 6 MM MISC Use daily with Saxenda 100 each 0   losartan (COZAAR) 25 MG tablet TAKE 1 TABLET BY MOUTH EVERY DAY FOR BLOOD PRESSURE 90 tablet 2   methocarbamol (ROBAXIN) 500 MG tablet Take 500 mg by mouth 3 (three) times daily.  mometasone-formoterol (DULERA) 100-5 MCG/ACT AERO Inhale 2 puffs into the lungs 2 (two) times daily. 13 g 5   PARoxetine (PAXIL) 20 MG tablet Take 1 tablet (20 mg total) by mouth daily. For anxiety 90 tablet 0   rosuvastatin (CRESTOR) 10 MG tablet TAKE 1 TABLET BY MOUTH DAILY. FOR CHOLESTEROL 90 tablet 1   SUMAtriptan (IMITREX) 50 MG tablet TAKE 1 TABLET MY MOUTH AT MIGRAINE ONSET. MAY REPEAT IN 2 HOURS IF HEADACHE PERSISTS OR RECURS. 10 tablet 5   triamcinolone (NASACORT AQ) 55 MCG/ACT nasal  inhaler Place 2 sprays into the nose daily. 1 Inhaler 0   Vitamin D, Ergocalciferol, (DRISDOL) 1.25 MG (50000 UNIT) CAPS capsule Take 1 capsule (50,000 Units total) by mouth every 7 (seven) days. 13 capsule 0   azithromycin (ZITHROMAX) 250 MG tablet Take 2 tablets by mouth today, then 1 tablet daily for 4 additional days. (Patient not taking: Reported on 07/17/2022) 6 tablet 0   benzonatate (TESSALON) 200 MG capsule Take 1 capsule (200 mg total) by mouth 3 (three) times daily as needed for cough. (Patient not taking: Reported on 07/17/2022) 15 capsule 0   No facility-administered medications prior to visit.     Per HPI unless specifically indicated in ROS section below Review of Systems Objective:  There were no vitals taken for this visit.  Wt Readings from Last 3 Encounters:  07/08/22 242 lb (109.8 kg)  06/24/22 244 lb 9.6 oz (110.9 kg)  05/28/22 245 lb (111.1 kg)       Physical exam: General: Alert and oriented x 3, does not appear sickly, appears very uncomfortable. Laying on her back during visit.  Pulmonary: Speaks in complete sentences without increased work of breathing, no cough during visit.  Psychiatric: Normal mood, thought content, and behavior.     Results for orders placed or performed in visit on 05/28/22  CMP14+EGFR  Result Value Ref Range   Glucose 91 70 - 99 mg/dL   BUN 13 6 - 24 mg/dL   Creatinine, Ser 0.68 0.57 - 1.00 mg/dL   eGFR 102 >59 mL/min/1.73   BUN/Creatinine Ratio 19 9 - 23   Sodium 140 134 - 144 mmol/L   Potassium 4.6 3.5 - 5.2 mmol/L   Chloride 100 96 - 106 mmol/L   CO2 21 20 - 29 mmol/L   Calcium 9.4 8.7 - 10.2 mg/dL   Total Protein 7.4 6.0 - 8.5 g/dL   Albumin 4.4 3.8 - 4.9 g/dL   Globulin, Total 3.0 1.5 - 4.5 g/dL   Albumin/Globulin Ratio 1.5 1.2 - 2.2   Bilirubin Total 0.2 0.0 - 1.2 mg/dL   Alkaline Phosphatase 73 44 - 121 IU/L   AST 21 0 - 40 IU/L   ALT 13 0 - 32 IU/L  CBC With Differential  Result Value Ref Range   WBC 14.5 (H)  3.4 - 10.8 x10E3/uL   RBC 4.50 3.77 - 5.28 x10E6/uL   Hemoglobin 13.4 11.1 - 15.9 g/dL   Hematocrit 40.1 34.0 - 46.6 %   MCV 89 79 - 97 fL   MCH 29.8 26.6 - 33.0 pg   MCHC 33.4 31.5 - 35.7 g/dL   RDW 13.1 11.7 - 15.4 %   Neutrophils 77 Not Estab. %   Lymphs 17 Not Estab. %   Monocytes 5 Not Estab. %   Eos 0 Not Estab. %   Basos 0 Not Estab. %   Neutrophils Absolute 11.1 (H) 1.4 - 7.0 x10E3/uL   Lymphocytes Absolute 2.5 0.7 -  3.1 x10E3/uL   Monocytes Absolute 0.8 0.1 - 0.9 x10E3/uL   EOS (ABSOLUTE) 0.0 0.0 - 0.4 x10E3/uL   Basophils Absolute 0.0 0.0 - 0.2 x10E3/uL   Immature Granulocytes 1 Not Estab. %   Immature Grans (Abs) 0.1 0.0 - 0.1 x10E3/uL  Hemoglobin A1c  Result Value Ref Range   Hgb A1c MFr Bld 5.9 (H) 4.8 - 5.6 %   Est. average glucose Bld gHb Est-mCnc 123 mg/dL  TSH  Result Value Ref Range   TSH 2.340 0.450 - 4.500 uIU/mL  VITAMIN D 25 Hydroxy (Vit-D Deficiency, Fractures)  Result Value Ref Range   Vit D, 25-Hydroxy 27.1 (L) 30.0 - 100.0 ng/mL   Assessment & Plan:   Problem List Items Addressed This Visit       Nervous and Auditory   Acute midline low back pain with bilateral sciatica - Primary    No alarm signs.  Treat current flare with prednisone 60 mg x 3 days, 40 mg x 3 days, 20 mg x 3 days. Start cyclobenzaprine 5-10 mg TID PRN. Drowsiness precautions provided.   Will obtain xray of lumbar spine once able.  Consider referral back to [facility]. She may ultimately need a MRI lumbar spine.       Relevant Medications   predniSONE (DELTASONE) 20 MG tablet   cyclobenzaprine (FLEXERIL) 5 MG tablet     Other   Chronic back pain   Relevant Medications   predniSONE (DELTASONE) 20 MG tablet   cyclobenzaprine (FLEXERIL) 5 MG tablet   Other Relevant Orders   DG Lumbar Spine Complete     Meds ordered this encounter  Medications   predniSONE (DELTASONE) 20 MG tablet    Sig: Take 3 tablets by mouth daily x 3 days, then 2 tablets x 3 days, then 1  tablet x 3 days.    Dispense:  18 tablet    Refill:  0    Order Specific Question:   Supervising Provider    Answer:   BEDSOLE, AMY E [[id number]]   cyclobenzaprine (FLEXERIL) 5 MG tablet    Sig: Take 1-2 tablets (5-10 mg total) by mouth 3 (three) times daily as needed for muscle spasms.    Dispense:  15 tablet    Refill:  0    Order Specific Question:   Supervising Provider    Answer:   Diona Browner, AMY E [[id number]]   Orders Placed This Encounter  Procedures   DG Lumbar Spine Complete    56 year old female with acute on chronic back pain with bilateral sciatica. No trauma.    Standing Status:   Future    Standing Expiration Date:   07/18/2023    Order Specific Question:   Reason for Exam (SYMPTOM  OR DIAGNOSIS REQUIRED)    Answer:   acute on chronic back pain    Order Specific Question:   Is patient pregnant?    Answer:   No    Order Specific Question:   Preferred imaging location?    Answer:   [facility]    I discussed the assessment and treatment plan with the patient. The patient was provided an opportunity to ask questions and all were answered. The patient agreed with the plan and demonstrated an understanding of the instructions. The patient was advised to call back or seek an in-person evaluation if the symptoms worsen or if the condition fails to improve as anticipated.  Follow up plan:  Start prednisone tablets as discussed.  Start cyclobenzaprine  muscle relaxer as needed for muscle spasms. This may cause drowsiness.   Come by for the xray as discussed.   It was a pleasure to see you today!  Pleas Koch, NP

## 2022-07-17 NOTE — Patient Instructions (Signed)
Start prednisone tablets as discussed.  Start cyclobenzaprine muscle relaxer as needed for muscle spasms. This may cause drowsiness.   Come by for the xray as discussed.   It was a pleasure to see you today!

## 2022-07-19 ENCOUNTER — Ambulatory Visit (INDEPENDENT_AMBULATORY_CARE_PROVIDER_SITE_OTHER)
Admission: RE | Admit: 2022-07-19 | Discharge: 2022-07-19 | Disposition: A | Discharge status: Home / Self Care | Payer: 59 | Source: Ambulatory Visit | Attending: Primary Care | Admitting: Primary Care

## 2022-07-19 DIAGNOSIS — M545 Low back pain, unspecified: Secondary | ICD-10-CM | POA: Diagnosis not present

## 2022-07-19 DIAGNOSIS — G8929 Other chronic pain: Secondary | ICD-10-CM | POA: Diagnosis not present

## 2022-07-22 DIAGNOSIS — M5441 Lumbago with sciatica, right side: Secondary | ICD-10-CM

## 2022-07-25 ENCOUNTER — Ambulatory Visit (INDEPENDENT_AMBULATORY_CARE_PROVIDER_SITE_OTHER): Payer: 59 | Admitting: Internal Medicine

## 2022-07-26 ENCOUNTER — Ambulatory Visit
Admission: RE | Admit: 2022-07-26 | Discharge: 2022-07-26 | Disposition: A | Discharge status: Home / Self Care | Payer: 59 | Source: Ambulatory Visit | Attending: Primary Care | Admitting: Primary Care

## 2022-07-26 DIAGNOSIS — Z1231 Encounter for screening mammogram for malignant neoplasm of breast: Secondary | ICD-10-CM | POA: Insufficient documentation

## 2022-07-30 MED ORDER — PREDNISONE 20 MG PO TABS: ORAL_TABLET | ORAL | 0 refills | Status: DC

## 2022-07-30 MED ORDER — CYCLOBENZAPRINE HCL 5 MG PO TABS: 5.0000 mg | ORAL_TABLET | Freq: Three times a day (TID) | ORAL | 0 refills | Status: DC | PRN

## 2022-07-31 ENCOUNTER — Other Ambulatory Visit: Payer: Self-pay | Admitting: Primary Care

## 2022-07-31 DIAGNOSIS — F411 Generalized anxiety disorder: Secondary | ICD-10-CM

## 2022-08-05 ENCOUNTER — Telehealth (INDEPENDENT_AMBULATORY_CARE_PROVIDER_SITE_OTHER): Payer: 59 | Admitting: Psychology

## 2022-08-05 DIAGNOSIS — F5089 Other specified eating disorder: Secondary | ICD-10-CM | POA: Diagnosis not present

## 2022-08-05 DIAGNOSIS — F419 Anxiety disorder, unspecified: Secondary | ICD-10-CM

## 2022-08-05 DIAGNOSIS — F32A Depression, unspecified: Secondary | ICD-10-CM

## 2022-08-05 NOTE — Progress Notes (Signed)
  Office: [phone]  /  Fax: [phone]    Date: August 05, 2022    Appointment Start Time: 8:31am Duration: 29 minutes Provider: Glennie Isle, Psy.D. Type of Session: Individual Therapy  Location of Patient: Home (private location) Location of Provider: Provider's Home (private office) Type of Contact: Telepsychological Visit via MyChart Video Visit  Session Content: Amanda Dixon is a 57 y.o. female presenting for a follow-up appointment to address the previously established treatment goal of increasing coping skills.Today's appointment was a telepsychological visit. Amanda Dixon provided verbal consent for today's telepsychological appointment and she is aware she is responsible for securing confidentiality on her end of the session. Prior to proceeding with today's appointment, Amanda Dixon's physical location at the time of this appointment was obtained as well a phone number she could be reached at in the event of technical difficulties. Amanda Dixon and this provider participated in today's telepsychological service.   This provider conducted a brief check-in. Amanda Dixon shared about issues with her "SI joint," noting she has attended numerous medical appointments since the last appointment with this provider. She acknowledged the pain has impacted her appetite and has contributed to emotional eating behaviors. This provider again recommended traditional therapeutic services. She indicated she submitted the new patient paperwork to [facility], but it was reportedly not received. She agreed to call [facility] today to follow-up again. Moreover, Amanda Dixon was engaged in problem solving to develop a plan to help cope with urges/cravings involving activities to relax, activities to distract, comforting places, people to call and connect with, and activities that help soothe senses. She was observed writing the plan. Overall, Amanda Dixon was receptive to today's appointment as evidenced by openness to sharing,  responsiveness to feedback, and willingness to implement discussed strategies .  Mental Status Examination:  Appearance: neat Behavior: appropriate to circumstances Mood: neutral Affect: mood congruent Speech: WNL Eye Contact: appropriate Psychomotor Activity: WNL Gait: unable to assess Thought Process: linear, logical, and goal directed and no evidence or endorsement of suicidal, homicidal, and self-harm ideation, plan and intent  Thought Content/Perception: no hallucinations, delusions, bizarre thinking or behavior endorsed or observed Orientation: AAOx4 Memory/Concentration: memory, attention, language, and fund of knowledge intact  Insight: fair Judgment: fair  Interventions:  Conducted a brief chart review Provided empathic reflections and validation Employed supportive psychotherapy interventions to facilitate reduced distress and to improve coping skills with identified stressors Engaged patient in problem solving Recommended traditional therapeutic services   DSM-5 Diagnosis(es):  F50.89 Other Specified Feeding or Eating Disorder, Emotional Eating Behaviors, F41.9 Unspecified Anxiety Disorder, and  F32.A Unspecified Depressive Disorder  Treatment Goal & Progress: During the initial appointment with this provider, the following treatment goal was established: increase coping skills. Amanda Dixon has demonstrated progress in her goal as evidenced by increased awareness of hunger patterns and increased awareness of triggers for emotional eating behaviors. Amanda Dixon also continues to demonstrate willingness to engage in learned skill(s).  Plan: The next appointment is scheduled for 08/19/2022 at 11:30am, which will be via MyChart Video Visit. The next session will focus on working towards the established treatment goal. Additionally, Amanda Dixon will follow-up with [facility].

## 2022-08-06 MED ORDER — METHOCARBAMOL 500 MG PO TABS: 500.0000 mg | ORAL_TABLET | Freq: Three times a day (TID) | ORAL | 0 refills | Status: DC | PRN

## 2022-08-06 MED ORDER — MELOXICAM 15 MG PO TABS: 15.0000 mg | ORAL_TABLET | Freq: Every day | ORAL | 0 refills | Status: DC | PRN

## 2022-08-07 ENCOUNTER — Other Ambulatory Visit (INDEPENDENT_AMBULATORY_CARE_PROVIDER_SITE_OTHER): Payer: Self-pay

## 2022-08-07 ENCOUNTER — Other Ambulatory Visit: Payer: Self-pay

## 2022-08-07 ENCOUNTER — Encounter (INDEPENDENT_AMBULATORY_CARE_PROVIDER_SITE_OTHER): Payer: Self-pay | Admitting: Internal Medicine

## 2022-08-07 ENCOUNTER — Ambulatory Visit (INDEPENDENT_AMBULATORY_CARE_PROVIDER_SITE_OTHER): Payer: 59 | Admitting: Internal Medicine

## 2022-08-07 VITALS — BP 136/82 | HR 84 | Temp 98.0°F | Ht 65.0 in | Wt 244.0 lb

## 2022-08-07 DIAGNOSIS — Z6841 Body Mass Index (BMI) 40.0 and over, adult: Secondary | ICD-10-CM

## 2022-08-07 DIAGNOSIS — I10 Essential (primary) hypertension: Secondary | ICD-10-CM | POA: Diagnosis not present

## 2022-08-07 DIAGNOSIS — R638 Other symptoms and signs concerning food and fluid intake: Secondary | ICD-10-CM

## 2022-08-07 DIAGNOSIS — E66813 Obesity, class 3: Secondary | ICD-10-CM

## 2022-08-07 MED ORDER — SEMAGLUTIDE-WEIGHT MANAGEMENT 0.25 MG/0.5ML ~~LOC~~ SOAJ: 0.2500 mg | SUBCUTANEOUS | 0 refills | Status: DC

## 2022-08-07 NOTE — Progress Notes (Signed)
Chief Complaint:   OBESITY Amanda Dixon is here to discuss her progress with her obesity treatment plan along with follow-up of her obesity related diagnoses. Amanda Dixon is on the Category 3 Plan and states she is following her eating plan approximately 30-40% of the time. Amanda Dixon states she is doing 0 minutes 0 times per week.  Today's visit was #: 4 Starting weight: 247 lbs Starting date: 05/14/2022 Today's weight: 244 lbs Today's date: 08/07/2022 Total lbs lost to date: 3 Total lbs lost since last in-office visit: 0  Interim History: Amanda Dixon presents today for weight management.  She has had a slow implementation of prescribed reduced calorie meal plan.  She notes challenges with increased hunger signals, cravings and grazing behavior.  She has been working with our behavioral health specialist.  She has been working on mindfulness, reducing unhealthy snacking.  Has increased water consumption and is trying to avoid tempting foods.  She inquires about pharmacotherapy.  She will also begin exercise program this was temporarily on hold because of back injury and this has improved.  Patient has also been working with her psychiatrist and they have reduced paroxetine but there is some hesitation about changing medication as it has been beneficial.  Patient aware that medication may cause weight gain.   Subjective:   1. Primary hypertension Blood pressure close to goal for age and risk category.  On losartan 25 mg a day without adverse effects.  Most recent renal parameters reviewed which showed normal electrolytes and kidney function.  2. Abnormal craving Multifactorial.  She has prediabetes and elevated insulin levels.  This may contribute to carb cravings, fatigue as well as decreased satiety.  Assessment/Plan:   1. Primary hypertension Continue with weight loss therapy.  Monitor for symptoms of orthostasis while losing weight. Continue current regimen and home monitoring for a goal blood pressure of  120/80.  2. Abnormal craving Patient will be started on GLP-1 therapy in addition to MNT.  3. Obesity, Class III, BMI 40-49.9 (morbid obesity) ([facility]) In addition to prescribed reduced calorie nutritional plan and increasing physical activity, I feel this patient would benefit from GLP-1 therapy to assist with hunger signals, satiety and cravings.  After discussion of mechanisms of action, benefits, common side effects, indication of long-term use and shared decision making she is agreeable to starting Wegovy 0.25 mg once a week.  She has relative contraindications to sympathomimetics and topiramate.  - Semaglutide-Weight Management 0.25 MG/0.5ML SOAJ; Inject 0.25 mg into the skin once a week for 28 days.  Dispense: 2 mL; Refill: 0  Amanda Dixon is currently in the action stage of change. As such, her goal is to continue with weight loss efforts. She has agreed to the Category 3 Plan.   Exercise goals: Resume exercise.   Behavioral modification strategies: increasing lean protein intake, increasing water intake, no skipping meals, meal planning and cooking strategies, better snacking choices, avoiding temptations, and planning for success.  Amanda Dixon has agreed to follow-up with our clinic in 3 weeks. She was informed of the importance of frequent follow-up visits to maximize her success with intensive lifestyle modifications for her multiple health conditions.   Objective:   Blood pressure 136/82, pulse 84, temperature 98 F (36.7 C), height 5\' 5"  (1.651 m), weight 244 lb (110.7 kg), SpO2 97 %. Body mass index is 40.6 kg/m.  General: Cooperative, alert, well developed, in no acute distress. HEENT: Conjunctivae and lids unremarkable. Cardiovascular: Regular rhythm.  Lungs: Normal work of breathing. Neurologic: No focal  deficits.   Lab Results  Component Value Date   CREATININE 0.68 05/28/2022   BUN 13 05/28/2022   NA 140 05/28/2022   K 4.6 05/28/2022   CL 100 05/28/2022   CO2 21 05/28/2022    Lab Results  Component Value Date   ALT 13 05/28/2022   AST 21 05/28/2022   ALKPHOS 73 05/28/2022   BILITOT 0.2 05/28/2022   Lab Results  Component Value Date   HGBA1C 5.9 (H) 05/28/2022   HGBA1C 5.9 12/21/2021   HGBA1C 5.7 06/01/2021   HGBA1C 5.7 01/25/2020   HGBA1C 5.5 01/27/2019   Lab Results  Component Value Date   INSULIN 24.3 05/14/2022   Lab Results  Component Value Date   TSH 2.340 05/28/2022   Lab Results  Component Value Date   CHOL 174 05/14/2022   HDL 48 05/14/2022   LDLCALC 100 (H) 05/14/2022   TRIG 151 (H) 05/14/2022   CHOLHDL 3 07/10/2021   Lab Results  Component Value Date   VD25OH 27.1 (L) 05/28/2022   Lab Results  Component Value Date   WBC 14.5 (H) 05/28/2022   HGB 13.4 05/28/2022   HCT 40.1 05/28/2022   MCV 89 05/28/2022   PLT 321 08/06/2021   No results found for: "IRON", "TIBC", "FERRITIN"  Attestation Statements:   Reviewed by clinician on day of visit: allergies, medications, problem list, medical history, surgical history, family history, social history, and previous encounter notes.  Time spent on visit including pre-visit chart review and post-visit care and charting was 30 minutes.   Wilhemena Durie, am acting as transcriptionist for Thomes Dinning, MD.  I have reviewed the above documentation for accuracy and completeness, and I agree with the above. -Thomes Dinning, MD

## 2022-08-19 ENCOUNTER — Telehealth (INDEPENDENT_AMBULATORY_CARE_PROVIDER_SITE_OTHER): Payer: 59 | Admitting: Psychology

## 2022-08-19 DIAGNOSIS — F5089 Other specified eating disorder: Secondary | ICD-10-CM | POA: Diagnosis not present

## 2022-08-19 DIAGNOSIS — F419 Anxiety disorder, unspecified: Secondary | ICD-10-CM | POA: Diagnosis not present

## 2022-08-19 DIAGNOSIS — F32A Depression, unspecified: Secondary | ICD-10-CM

## 2022-08-19 NOTE — Progress Notes (Signed)
  Office: [phone]  /  Fax: [phone]    Date: August 19, 2022    Appointment Start Time: 11:34am Duration: 23 minutes Provider: Glennie Isle, Psy.D. Type of Session: Individual Therapy  Location of Patient: Home (private location) Location of Provider: Provider's Home (private office) Type of Contact: Telepsychological Visit via MyChart Video Visit  Session Content: Amanda Dixon is a 57 y.o. female presenting for a follow-up appointment to address the previously established treatment goal of increasing coping skills.Today's appointment was a telepsychological visit. Amanda Dixon provided verbal consent for today's telepsychological appointment and she is aware she is responsible for securing confidentiality on her end of the session. Prior to proceeding with today's appointment, Amanda Dixon's physical location at the time of this appointment was obtained as well a phone number she could be reached at in the event of technical difficulties. Amanda Dixon and this provider participated in today's telepsychological service.   This provider conducted a brief check-in. Amanda Dixon shared about her current physical limitations. She further shared she started [facility] since the last appointment with this provider and also discussed implementation of her developed plan from the last appointment. Positive reinforcement was provided. She described a reduction in emotional eating behaviors, but noted some challenges with eating habits when experiencing pain and when sharing food with others. Further explored and processed. Psychoeducation regarding an eating-related mindfulness exercise was provided. She was observed writing notes. Amanda Dixon was receptive to today's appointment as evidenced by openness to sharing, responsiveness to feedback, and willingness to implement discussed strategies .  Mental Status Examination:  Appearance: neat Behavior: appropriate to circumstances Mood: neutral Affect: mood congruent Speech: WNL Eye Contact:  appropriate Psychomotor Activity: WNL Gait: unable to assess Thought Process: linear, logical, and goal directed and no evidence or endorsement of suicidal, homicidal, and self-harm ideation, plan and intent  Thought Content/Perception: no hallucinations, delusions, bizarre thinking or behavior endorsed or observed Orientation: AAOx4 Memory/Concentration: memory, attention, language, and fund of knowledge intact  Insight: fair Judgment: fair  Interventions:  Conducted a brief chart review Provided empathic reflections and validation Reviewed content from the previous session Provided positive reinforcement Employed supportive psychotherapy interventions to facilitate reduced distress and to improve coping skills with identified stressors Psychoeducation provided regarding mindfulness  DSM-5 Diagnosis(es):  F50.89 Other Specified Feeding or Eating Disorder, Emotional Eating Behaviors, F41.9 Unspecified Anxiety Disorder, and  F32.A Unspecified Depressive Disorder  Treatment Goal & Progress: During the initial appointment with this provider, the following treatment goal was established: increase coping skills. Amanda Dixon has demonstrated progress in her goal as evidenced by increased awareness of hunger patterns and increased awareness of triggers for emotional eating behaviors. Amanda Dixon also continues to demonstrate willingness to engage in learned skill(s).  Plan: The next appointment is scheduled for 09/02/2022 at 11:30am, which will be via MyChart Video Visit. The next session will focus on working towards the established treatment goal. Amanda Dixon stated she contacted [facility] and plans to call back again today to schedule an appointment.

## 2022-08-28 ENCOUNTER — Other Ambulatory Visit: Payer: Self-pay | Admitting: Primary Care

## 2022-08-28 DIAGNOSIS — E785 Hyperlipidemia, unspecified: Secondary | ICD-10-CM

## 2022-08-29 ENCOUNTER — Ambulatory Visit (INDEPENDENT_AMBULATORY_CARE_PROVIDER_SITE_OTHER): Payer: 59 | Admitting: Internal Medicine

## 2022-08-29 ENCOUNTER — Encounter (INDEPENDENT_AMBULATORY_CARE_PROVIDER_SITE_OTHER): Payer: Self-pay | Admitting: Internal Medicine

## 2022-08-29 VITALS — BP 124/82 | HR 80 | Temp 97.9°F | Ht 65.0 in | Wt 238.0 lb

## 2022-08-29 DIAGNOSIS — E559 Vitamin D deficiency, unspecified: Secondary | ICD-10-CM

## 2022-08-29 DIAGNOSIS — I10 Essential (primary) hypertension: Secondary | ICD-10-CM | POA: Diagnosis not present

## 2022-08-29 DIAGNOSIS — R7303 Prediabetes: Secondary | ICD-10-CM | POA: Diagnosis not present

## 2022-08-29 DIAGNOSIS — Z6841 Body Mass Index (BMI) 40.0 and over, adult: Secondary | ICD-10-CM

## 2022-08-29 DIAGNOSIS — E669 Obesity, unspecified: Secondary | ICD-10-CM | POA: Diagnosis not present

## 2022-08-29 MED ORDER — VITAMIN D (ERGOCALCIFEROL) 1.25 MG (50000 UNIT) PO CAPS: 50000.0000 [IU] | ORAL_CAPSULE | ORAL | 0 refills | Status: DC

## 2022-08-29 MED ORDER — SEMAGLUTIDE-WEIGHT MANAGEMENT 0.5 MG/0.5ML ~~LOC~~ SOAJ: 0.5000 mg | SUBCUTANEOUS | 0 refills | Status: DC

## 2022-08-29 NOTE — Progress Notes (Signed)
Office: [phone]  /  Fax: [phone]  WEIGHT SUMMARY AND BIOMETRICS  Medical Weight Loss Height: 5\' 5"  (1.651 m) Weight: 238 lb (108 kg) Temp: 97.9 F (36.6 C) Pulse Rate: 80 BP: 124/82 SpO2: 96 % Fasting: no Labs: no Today's Visit #: 5 Weight at Last VIsit: 244 lb Weight Lost Since Last Visit: 6 lb  Body Fat %: 46.7 % Fat Mass (lbs): 111.4 lbs Muscle Mass (lbs): 120.6 lbs Total Body Water (lbs): 80.4 lbs Visceral Fat Rating : 14 Peak Weight: 254 lb Starting Date: 05/14/22 Starting Weight: 247 lb Total Weight Loss (lbs): 9 lb (4.082 kg)    HPI  Chief Complaint: OBESITY  Amanda Dixon is here to discuss her progress with her obesity treatment plan. She is on the keeping a food journal and adhering to recommended goals of 1500 calories and 100 protein and states she is following her eating plan approximately 70 % of the time. She states she is not exercising.   Interval History: Amanda Dixon presents today for follow-up.  Since last office visit she has lost 6 pounds.  She was started on Wegovy 0.25 mg a week.  She denies side effects to medication.  She notes a decrease in appetite as well as cravings.  She has been doing a good job adhering to reduced calorie diet via journaling.  She is also trying to eat more fruits and vegetables, is reading labels and learning through tracking.  She is also been mindful about treats and although is not depriving herself she is eating smaller portions of foods that she tends to enjoy.  She seems very motivated but at the same time feels limited because of her chronic pain but is trying to get some walks in throughout the week.   Pharmacotherapy: Wegovy 0.25 mg once a week  PHYSICAL EXAM:  Blood pressure 124/82, pulse 80, temperature 97.9 F (36.6 C), height 5\' 5"  (1.651 m), weight 238 lb (108 kg), SpO2 96 %. Body mass index is 39.61 kg/m.  General: She is overweight, cooperative, alert, well developed, and in no acute distress. PSYCH: Has  normal mood, affect and thought process.   HEENT: EOMI, sclerae are anicteric. Lungs: Normal breathing effort, no conversational dyspnea. Extremities: No edema.  Neurologic: No gross sensory or motor deficits. No tremors or fasciculations noted.    DIAGNOSTIC DATA REVIEWED:  BMET    Component Value Date/Time   NA 140 05/28/2022 1416   K 4.6 05/28/2022 1416   CL 100 05/28/2022 1416   CO2 21 05/28/2022 1416   GLUCOSE 91 05/28/2022 1416   GLUCOSE 91 08/21/2021 0832   BUN 13 05/28/2022 1416   CREATININE 0.68 05/28/2022 1416   CALCIUM 9.4 05/28/2022 1416   GFRNONAA >60 08/06/2021 1746   GFRAA 100 01/27/2019 1204   Lab Results  Component Value Date   HGBA1C 5.9 (H) 05/28/2022   HGBA1C 5.5 01/27/2019   Lab Results  Component Value Date   INSULIN 24.3 05/14/2022   Lab Results  Component Value Date   TSH 2.340 05/28/2022   CBC    Component Value Date/Time   WBC 14.5 (H) 05/28/2022 1416   WBC 11.1 (H) 08/06/2021 1746   RBC 4.50 05/28/2022 1416   RBC 4.42 08/06/2021 1746   HGB 13.4 05/28/2022 1416   HCT 40.1 05/28/2022 1416   PLT 321 08/06/2021 1746   MCV 89 05/28/2022 1416   MCH 29.8 05/28/2022 1416   MCH 30.8 08/06/2021 1746   MCHC 33.4 05/28/2022 1416  MCHC 33.1 08/06/2021 1746   RDW 13.1 05/28/2022 1416   Iron Studies No results found for: "IRON", "TIBC", "FERRITIN", "IRONPCTSAT" Lipid Panel     Component Value Date/Time   CHOL 174 05/14/2022 1103   TRIG 151 (H) 05/14/2022 1103   HDL 48 05/14/2022 1103   CHOLHDL 3 07/10/2021 0850   VLDL 19.4 07/10/2021 0850   LDLCALC 100 (H) 05/14/2022 1103   Hepatic Function Panel     Component Value Date/Time   PROT 7.4 05/28/2022 1416   ALBUMIN 4.4 05/28/2022 1416   AST 21 05/28/2022 1416   ALT 13 05/28/2022 1416   ALKPHOS 73 05/28/2022 1416   BILITOT 0.2 05/28/2022 1416      Component Value Date/Time   TSH 2.340 05/28/2022 1416   Nutritional Lab Results  Component Value Date   VD25OH 27.1 (L) 05/28/2022      ASSESSMENT AND PLAN  TREATMENT PLAN FOR OBESITY:  Recommended Dietary Goals  [name] is currently in the action stage of change. As such, her goal is to continue weight management plan. She has agreed to keeping a food journal and adhering to recommended goals of 1500 calories and 100 protein.  Behavioral Intervention  We discussed the following Behavioral Modification Strategies today: increasing lean protein intake, decreasing simple carbohydrates , increasing vegetables, increase water intake, decrease liquid calories, work on meal planning and easy cooking plans, and strict journaling .  Additional resources provided today: Introduction to [facility] app for quick product nutrition assessment  Recommended Physical Activity Goals  [name] has been advised to work up to 150 minutes of moderate intensity aerobic activity a week and strengthening exercises 2-3 times per week for cardiovascular health, weight loss maintenance and preservation of muscle mass.   She has agreed to increase physical activity in their day and reduce sedentary time (increase NEAT).    Pharmacotherapy We discussed various medication options to help [name] with her weight loss efforts and we both agreed to increase Wegovy to .5 mg once a week to assist with hunger signals, satiety and cravings and support reduced calorie nutrition plan.  ASSOCIATED CONDITIONS ADDRESSED TODAY  Prediabetes Assessment & Plan: Most recent A1c is  Lab Results  Component Value Date   HGBA1C 5.9 (H) 05/28/2022   with associated elevated insulin levels.  Patient informed of disease state and risk of progression. This may contribute to abnormal cravings, fatigue and diabetes complications without having diabetes.   Continue with reduced calorie meal plan low in simple sugars and incretin therapy with JSHFWY.  We will increase Wegovy to 0.5 mg once a week.    Obesity, Class III, BMI 40-49.9 (morbid obesity) ([facility])  Vitamin D  deficiency Assessment & Plan: Most recent vitamin D levels  Lab Results  Component Value Date   VD25OH 27.1 (L) 05/28/2022     Deficiency state associated with adiposity and may result in leptin resistance, weight gain and fatigue. Currently on vitamin D supplementation without any adverse effects.  Continue high-dose vitamin D 50,000 units once a week for total of 3 months and check levels after 3-4 months of therapy for a goal of 50-60 mg/dl.   Orders: -     Vitamin D (Ergocalciferol); Take 1 capsule (50,000 Units total) by mouth every 7 (seven) days.  Dispense: 13 capsule; Refill: 0  Primary hypertension Assessment & Plan: Blood pressure at goal for age and risk category.  On losartan 25 mg without adverse effects.  Most recent renal parameters reviewed which showed normal electrolytes  and kidney function.  Continue with weight loss therapy.  Monitor for symptoms of orthostasis while losing weight. Continue current regimen and home monitoring for a goal blood pressure of 120/80. Losartan   Other orders -     Semaglutide-Weight Management; Inject 0.5 mg into the skin once a week for 28 days.  Dispense: 2 mL; Refill: 0      Return in about 3 weeks (around 09/19/2022) for For Weight Mangement with Dr. Gerarda Fraction.Marland Kitchen She was informed of the importance of frequent follow up visits to maximize her success with intensive lifestyle modifications for her multiple health conditions.   ATTESTASTION STATEMENTS:  Reviewed by clinician on day of visit: allergies, medications, problem list, medical history, surgical history, family history, social history, and previous encounter notes.   Time spent on visit including pre-visit chart review and post-visit care and charting was 30 minutes.    Thomes Dinning, MD

## 2022-08-29 NOTE — Assessment & Plan Note (Signed)
Most recent vitamin D levels  Lab Results  Component Value Date   VD25OH 27.1 (L) 05/28/2022     Deficiency state associated with adiposity and may result in leptin resistance, weight gain and fatigue. Currently on vitamin D supplementation without any adverse effects.  Continue high-dose vitamin D 50,000 units once a week for total of 3 months and check levels after 3-4 months of therapy for a goal of 50-60 mg/dl.

## 2022-08-29 NOTE — Assessment & Plan Note (Signed)
Most recent A1c is  Lab Results  Component Value Date   HGBA1C 5.9 (H) 05/28/2022   with associated elevated insulin levels.  Patient informed of disease state and risk of progression. This may contribute to abnormal cravings, fatigue and diabetes complications without having diabetes.   Continue with reduced calorie meal plan low in simple sugars and incretin therapy with XIHWTU.  We will increase Wegovy to 0.5 mg once a week.

## 2022-08-29 NOTE — Assessment & Plan Note (Signed)
Blood pressure at goal for age and risk category.  On losartan 25 mg without adverse effects.  Most recent renal parameters reviewed which showed normal electrolytes and kidney function.  Continue with weight loss therapy.  Monitor for symptoms of orthostasis while losing weight. Continue current regimen and home monitoring for a goal blood pressure of 120/80. Losartan

## 2022-09-02 ENCOUNTER — Telehealth (INDEPENDENT_AMBULATORY_CARE_PROVIDER_SITE_OTHER): Payer: 59 | Admitting: Psychology

## 2022-09-02 DIAGNOSIS — F5089 Other specified eating disorder: Secondary | ICD-10-CM

## 2022-09-02 DIAGNOSIS — F419 Anxiety disorder, unspecified: Secondary | ICD-10-CM

## 2022-09-02 DIAGNOSIS — F32A Depression, unspecified: Secondary | ICD-10-CM | POA: Diagnosis not present

## 2022-09-02 NOTE — Progress Notes (Signed)
  Office: [phone]  /  Fax: [phone]    Date: September 02, 2022    Appointment Start Time: 11:27am Duration: 30 minutes Provider: Glennie Isle, Psy.D. Type of Session: Individual Therapy  Location of Patient: Work (private location) Location of Provider: Provider's Home (private office) Type of Contact: Telepsychological Visit via MyChart Video Visit  Session Content: Amanda Dixon is a 57 y.o. female presenting for a follow-up appointment to address the previously established treatment goal of increasing coping skills.Today's appointment was a telepsychological visit. Amanda Dixon provided verbal consent for today's telepsychological appointment and she is aware she is responsible for securing confidentiality on her end of the session. Prior to proceeding with today's appointment, Amanda Dixon's physical location at the time of this appointment was obtained as well a phone number she could be reached at in the event of technical difficulties. Amanda Dixon and this provider participated in today's telepsychological service.   This provider conducted a brief check-in. Amanda Dixon stated she lost weight, noting she is "not excited" as she "want[s] the big wow factor." Further explored and processed. She was engaged in thought challenging/reframing. Notably, she discussed making better choices and described a reduction in emotional eating behaviors. Overall, Amanda Dixon was receptive to today's appointment as evidenced by openness to sharing, responsiveness to feedback, and willingness to continue engaging in learned skills.  Mental Status Examination:  Appearance: neat Behavior: appropriate to circumstances Mood: neutral Affect: mood congruent Speech: WNL Eye Contact: appropriate Psychomotor Activity: WNL Gait: unable to assess Thought Process: linear, logical, and goal directed and no evidence or endorsement of suicidal, homicidal, and self-harm ideation, plan and intent  Thought Content/Perception: no hallucinations, delusions,  bizarre thinking or behavior endorsed or observed Orientation: AAOx4 Memory/Concentration: memory, attention, language, and fund of knowledge intact  Insight: fair Judgment: fair  Interventions:  Conducted a brief chart review Provided empathic reflections and validation Employed supportive psychotherapy interventions to facilitate reduced distress and to improve coping skills with identified stressors Engaged patient in thought challenging/cognitive restructuring   DSM-5 Diagnosis(es):  F50.89 Other Specified Feeding or Eating Disorder, Emotional Eating Behaviors, F41.9 Unspecified Anxiety Disorder, and  F32.A Unspecified Depressive Disorder  Treatment Goal & Progress: During the initial appointment with this provider, the following treatment goal was established: increase coping skills. Amanda Dixon has demonstrated progress in her goal as evidenced by increased awareness of hunger patterns, increased awareness of triggers for emotional eating behaviors, and reduction in emotional eating behaviors . Amanda Dixon also continues to demonstrate willingness to engage in learned skill(s).  Plan: The next appointment is scheduled for 09/17/2022 at 11:30am, which will be via MyChart Video Visit. The next session will focus on working towards the established treatment goal. Additionally, [name] stated she completed the new patient paperwork for [facility] and is in the process of scheduling an initial appointment.

## 2022-09-06 ENCOUNTER — Other Ambulatory Visit: Payer: Self-pay | Admitting: Sports Medicine

## 2022-09-06 DIAGNOSIS — M533 Sacrococcygeal disorders, not elsewhere classified: Secondary | ICD-10-CM

## 2022-09-06 DIAGNOSIS — M5136 Other intervertebral disc degeneration, lumbar region: Secondary | ICD-10-CM

## 2022-09-06 DIAGNOSIS — M25551 Pain in right hip: Secondary | ICD-10-CM

## 2022-09-06 DIAGNOSIS — G8929 Other chronic pain: Secondary | ICD-10-CM

## 2022-09-06 DIAGNOSIS — M47816 Spondylosis without myelopathy or radiculopathy, lumbar region: Secondary | ICD-10-CM

## 2022-09-06 DIAGNOSIS — M51369 Other intervertebral disc degeneration, lumbar region without mention of lumbar back pain or lower extremity pain: Secondary | ICD-10-CM

## 2022-09-08 ENCOUNTER — Encounter (INDEPENDENT_AMBULATORY_CARE_PROVIDER_SITE_OTHER): Payer: Self-pay | Admitting: Internal Medicine

## 2022-09-09 ENCOUNTER — Ambulatory Visit
Admission: RE | Admit: 2022-09-09 | Discharge: 2022-09-09 | Disposition: A | Discharge status: Home / Self Care | Payer: 59 | Source: Ambulatory Visit | Attending: Sports Medicine | Admitting: Sports Medicine

## 2022-09-09 DIAGNOSIS — M5441 Lumbago with sciatica, right side: Secondary | ICD-10-CM | POA: Insufficient documentation

## 2022-09-09 DIAGNOSIS — M25551 Pain in right hip: Secondary | ICD-10-CM | POA: Diagnosis present

## 2022-09-09 DIAGNOSIS — M47816 Spondylosis without myelopathy or radiculopathy, lumbar region: Secondary | ICD-10-CM

## 2022-09-09 DIAGNOSIS — G8929 Other chronic pain: Secondary | ICD-10-CM | POA: Diagnosis present

## 2022-09-09 DIAGNOSIS — M5442 Lumbago with sciatica, left side: Secondary | ICD-10-CM | POA: Insufficient documentation

## 2022-09-09 DIAGNOSIS — M533 Sacrococcygeal disorders, not elsewhere classified: Secondary | ICD-10-CM

## 2022-09-09 DIAGNOSIS — M5136 Other intervertebral disc degeneration, lumbar region: Secondary | ICD-10-CM | POA: Insufficient documentation

## 2022-09-17 ENCOUNTER — Telehealth (INDEPENDENT_AMBULATORY_CARE_PROVIDER_SITE_OTHER): Payer: Self-pay | Admitting: Psychology

## 2022-09-17 ENCOUNTER — Telehealth (INDEPENDENT_AMBULATORY_CARE_PROVIDER_SITE_OTHER): Payer: 59 | Admitting: Psychology

## 2022-09-17 NOTE — Progress Notes (Incomplete)
  Office: [phone]  /  Fax: [phone]    Date: September 17, 2022    Appointment Start Time: *** Duration: *** minutes Provider: Glennie Isle, Psy.D. Type of Session: Individual Therapy  Location of Patient: {gbptloc:23249} (private location) Location of Provider: Provider's Home (private office) Type of Contact: Telepsychological Visit via MyChart Video Visit  Session Content: This provider called Amanda Dixon at 11:32am as she did not present for today's appointment. A HIPAA compliant voicemail was left requesting a call back.  As such, today's appointment was initiated *** minutes late.Amanda Dixon is a 57 y.o. female presenting for a follow-up appointment to address the previously established treatment goal of increasing coping skills.Today's appointment was a telepsychological visit. Amanda Dixon provided verbal consent for today's telepsychological appointment and she is aware she is responsible for securing confidentiality on her end of the session. Prior to proceeding with today's appointment, Amanda Dixon's physical location at the time of this appointment was obtained as well a phone number she could be reached at in the event of technical difficulties. Amanda Dixon and this provider participated in today's telepsychological service.   This provider conducted a brief check-in. *** Amanda Dixon was receptive to today's appointment as evidenced by openness to sharing, responsiveness to feedback, and {gbreceptiveness:23401}.  Mental Status Examination:  Appearance: {Appearance:22431} Behavior: {Behavior:22445} Mood: {gbmood:21757} Affect: {Affect:22436} Speech: {Speech:22432} Eye Contact: {Eye Contact:22433} Psychomotor Activity: {Motor Activity:22434} Gait: {gbgait:23404} Thought Process: {thought process:22448}  Thought Content/Perception: {disturbances:22451} Orientation: {Orientation:22437} Memory/Concentration: {gbcognition:22449} Insight: {Insight:22446} Judgment: {Insight:22446}  Interventions:   {Interventions for Progress Notes:23405}  DSM-5 Diagnosis(es):  F50.89 Other Specified Feeding or Eating Disorder, Emotional Eating Behaviors, F41.9 Unspecified Anxiety Disorder, and  F32.A Unspecified Depressive Disorder  Treatment Goal & Progress: During the initial appointment with this provider, the following treatment goal was established: increase coping skills. Amanda Dixon has demonstrated progress in her goal as evidenced by {gbtxprogress:22839}. Amanda Dixon also {gbtxprogress2:22951}.  Plan: The next appointment is scheduled for *** at ***, which will be via MyChart Video Visit. The next session will focus on {Plan for Next Appointment:23400}.

## 2022-09-17 NOTE — Telephone Encounter (Signed)
  Office: [phone]  /  Fax: [phone]  Date of Call: September 17, 2022  Time of Call: 11:32am Provider: Glennie Isle, PsyD  CONTENT:  This provider called Virgil to check-in as she did not present for today's MyChart Video Visit appointment at 11:30am. A HIPAA compliant voicemail was left requesting a call back. Of note, this provider stayed on the MyChart Video Visit appointment for 5 minutes prior to signing off per the clinic's grace period policy.    PLAN: This provider will wait for Tonique to call back. No further follow-up planned by this provider.

## 2022-09-19 ENCOUNTER — Encounter (INDEPENDENT_AMBULATORY_CARE_PROVIDER_SITE_OTHER): Payer: Self-pay | Admitting: Internal Medicine

## 2022-09-19 ENCOUNTER — Ambulatory Visit (INDEPENDENT_AMBULATORY_CARE_PROVIDER_SITE_OTHER): Payer: 59 | Admitting: Internal Medicine

## 2022-09-19 ENCOUNTER — Other Ambulatory Visit: Payer: Self-pay

## 2022-09-19 VITALS — BP 140/80 | HR 72 | Temp 98.2°F | Ht 65.0 in | Wt 241.0 lb

## 2022-09-19 DIAGNOSIS — R638 Other symptoms and signs concerning food and fluid intake: Secondary | ICD-10-CM | POA: Insufficient documentation

## 2022-09-19 DIAGNOSIS — Z6841 Body Mass Index (BMI) 40.0 and over, adult: Secondary | ICD-10-CM

## 2022-09-19 DIAGNOSIS — R7303 Prediabetes: Secondary | ICD-10-CM | POA: Diagnosis not present

## 2022-09-19 HISTORY — DX: Other symptoms and signs concerning food and fluid intake: R63.8

## 2022-09-19 MED ORDER — SAXENDA 18 MG/3ML ~~LOC~~ SOPN
PEN_INJECTOR | SUBCUTANEOUS | 0 refills | Status: DC
  Filled 2022-09-20 – 2022-09-25 (×4): qty 6, 27d supply, fill #0

## 2022-09-19 MED ORDER — BD PEN NEEDLE NANO 2ND GEN 32G X 4 MM MISC
1.0000 | Freq: Every day | 0 refills | Status: DC
  Filled 2022-09-25: qty 100, 90d supply, fill #0

## 2022-09-19 NOTE — Progress Notes (Signed)
Office: [phone]  /  Fax: [phone]  WEIGHT SUMMARY AND BIOMETRICS  Medical Weight Loss Height: 5' 5"$  (1.651 m) Weight: 241 lb (109.3 kg) Temp: 98.2 F (36.8 C) Pulse Rate: 72 BP: (!) 140/80 SpO2: 97 % Fasting: No Labs: No Today's Visit #: 6 Weight at Last VIsit: 241 lb Weight Lost Since Last Visit: +3  Body Fat %: 48.7 % Fat Mass (lbs): 117.4 lbs Muscle Mass (lbs): 117.4 lbs Total Body Water (lbs): 84.8 lbs Visceral Fat Rating : 15 Peak Weight: 254 lb Starting Date: 05/14/22 Starting Weight: 247 lb Total Weight Loss (lbs): 9 lb (4.082 kg)    HPI  Chief Complaint: OBESITY  Amanda Dixon is here to discuss her progress with her obesity treatment plan. She is on the 1500 cal and 100 grams of protein daily and states she is following her eating plan approximately 60 % of the time. She states she is not exercising.   Interval History:  Since last office visit she has gained 3 pounds. She reports disruption in treatment due to supply issues pertaining to [facility]  She has been without medication now for 2 weeks.  She reports having a difficult time with cravings, hunger and making healthy choices.  She is aware of her choices and feels guilty at times but has a difficult time controlling the urge.  She has been referred to behavioral health but we noticed she missed her last appointment.  She has also been struggling with back pain which has been frustrating for her as she would like to be more physically active.  She also states stress eating due to the pain.  She is also increased liquid calories.   Pharmacotherapy: No longer on Wegovy due to supply issues  PHYSICAL EXAM:  Blood pressure (!) 140/80, pulse 72, temperature 98.2 F (36.8 C), height 5' 5"$  (1.651 m), weight 241 lb (109.3 kg), SpO2 97 %. Body mass index is 40.1 kg/m.  General: She is overweight, cooperative, alert, well developed, and in no acute distress. PSYCH: Has normal mood, affect and thought process.    HEENT: EOMI, sclerae are anicteric. Lungs: Normal breathing effort, no conversational dyspnea. Extremities: No edema.  Neurologic: No gross sensory or motor deficits. No tremors or fasciculations noted.    ASSESSMENT AND PLAN  TREATMENT PLAN FOR OBESITY:  Recommended Dietary Goals  Calii is currently in the action stage of change. As such, her goal is to continue weight management plan. She has agreed to the Category 3 Plan.  Behavioral Intervention  We discussed the following Behavioral Modification Strategies today: increasing lean protein intake, decreasing simple carbohydrates , increasing vegetables, increase water intake, decrease liquid calories, work on meal planning and easy cooking plans, think about ways to increase physical activity, emotional eating strategies, avoid or reduce consumption of processed foods, work on managing stress, creating time for self-care and relaxation measures, and avoiding temptations.  Additional resources provided today: NA  Recommended Physical Activity Goals  Lavene has been advised to work up to 150 minutes of moderate intensity aerobic activity a week and strengthening exercises 2-3 times per week for cardiovascular health, weight loss maintenance and preservation of muscle mass.   She has agreed to increase physical activity in their day and reduce sedentary time (increase NEAT).    Pharmacotherapy We discussed various medication options to help Cidney with her weight loss efforts and we both agreed to to try a different GLP-1 drug as she is having problems obtaining continuous supply of Wegovy due to  disruption in supply chain.  In addition to reduced calorie nutrition plan (RCNP), behavioral strategies and physical activity, Brian benefits from pharmacotherapy to assist with hunger signals, satiety and cravings.  She actually did well when she started [facility].  This will reduce obesity-related health risks by inducing weight loss, and help reduce  food consumption and adherence to [facility]) . It may also improve QOL by improving self-confidence and reduce the  setbacks associated with metabolic adaptations.  After discussion of treatment options, mechanisms of action, benefits, side effects, contraindications and shared decision making she is agreeable to starting Saxenda 0.6 mg subcu daily for 1 week then 1.2 mg for 1 week and then 1.8 mg a week. Patient also made aware that medication is indicated for long-term management of obesity and the risk of weight regain following discontinuation of treatment and hence the importance of adhering to medical weight loss plan.  We demonstrated use of device and patient using teach back method was able to demonstrate proper technique.   ASSOCIATED CONDITIONS ADDRESSED TODAY  Obesity, Class III, BMI 40-49.9 (morbid obesity) ([place]) -     Saxenda; Inject 0.6 mg into the skin daily for 7 days, THEN 1.2 mg daily for 7 days, THEN 1.8 mg daily for 14 days.  Dispense: 6 mL; Refill: 0 -     BD Pen Needle Nano 2nd Gen; Inject 1 Package as directed daily before breakfast. Check 2 times daily  Dispense: 100 each; Refill: 0  Prediabetes Assessment & Plan: Most recent A1c is  Lab Results  Component Value Date   HGBA1C 5.9 (H) 05/28/2022   with associated elevated insulin levels.  Patient informed of disease state and risk of progression. This may contribute to abnormal cravings, fatigue and diabetes complications without having diabetes.   Continue to work on nutritional strategies patient does benefit from incretin therapy.  She will be started on Saxenda.  Has had a difficult time obtaining Wegovy.    Abnormal craving Assessment & Plan: Neurohormonal and psychological.  We had referred her to behavioral health specialist but has not followed up recently.  Patient encouraged to do so.  She will be started on incretin therapy to assist with neurohormonal pathways.       DIAGNOSTIC DATA REVIEWED:  BMET     Component Value Date/Time   NA 140 05/28/2022 1416   K 4.6 05/28/2022 1416   CL 100 05/28/2022 1416   CO2 21 05/28/2022 1416   GLUCOSE 91 05/28/2022 1416   GLUCOSE 91 08/21/2021 0832   BUN 13 05/28/2022 1416   CREATININE 0.68 05/28/2022 1416   CALCIUM 9.4 05/28/2022 1416   GFRNONAA >60 08/06/2021 1746   GFRAA 100 01/27/2019 1204   Lab Results  Component Value Date   HGBA1C 5.9 (H) 05/28/2022   HGBA1C 5.5 01/27/2019   Lab Results  Component Value Date   INSULIN 24.3 05/14/2022   Lab Results  Component Value Date   TSH 2.340 05/28/2022   CBC    Component Value Date/Time   WBC 14.5 (H) 05/28/2022 1416   WBC 11.1 (H) 08/06/2021 1746   RBC 4.50 05/28/2022 1416   RBC 4.42 08/06/2021 1746   HGB 13.4 05/28/2022 1416   HCT 40.1 05/28/2022 1416   PLT 321 08/06/2021 1746   MCV 89 05/28/2022 1416   MCH 29.8 05/28/2022 1416   MCH 30.8 08/06/2021 1746   MCHC 33.4 05/28/2022 1416   MCHC 33.1 08/06/2021 1746   RDW 13.1 05/28/2022 1416   Iron Studies  No results found for: "IRON", "TIBC", "FERRITIN", "IRONPCTSAT" Lipid Panel     Component Value Date/Time   CHOL 174 05/14/2022 1103   TRIG 151 (H) 05/14/2022 1103   HDL 48 05/14/2022 1103   CHOLHDL 3 07/10/2021 0850   VLDL 19.4 07/10/2021 0850   LDLCALC 100 (H) 05/14/2022 1103   Hepatic Function Panel     Component Value Date/Time   PROT 7.4 05/28/2022 1416   ALBUMIN 4.4 05/28/2022 1416   AST 21 05/28/2022 1416   ALT 13 05/28/2022 1416   ALKPHOS 73 05/28/2022 1416   BILITOT 0.2 05/28/2022 1416      Component Value Date/Time   TSH 2.340 05/28/2022 1416   Nutritional Lab Results  Component Value Date   VD25OH 27.1 (L) 05/28/2022      Return in about 2 years (around 09/19/2024) for For Weight Mangement with Dr. Gerarda Fraction.Marland Kitchen She was informed of the importance of frequent follow up visits to maximize her success with intensive lifestyle modifications for her multiple health conditions.    ATTESTASTION  STATEMENTS:  Reviewed by clinician on day of visit: allergies, medications, problem list, medical history, surgical history, family history, social history, and previous encounter notes.   Time spent on visit including pre-visit chart review and post-visit care and charting was 30 minutes.    Thomes Dinning, MD

## 2022-09-19 NOTE — Assessment & Plan Note (Signed)
Neurohormonal and psychological.  We had referred her to behavioral health specialist but has not followed up recently.  Patient encouraged to do so.  She will be started on incretin therapy to assist with neurohormonal pathways.

## 2022-09-19 NOTE — Assessment & Plan Note (Signed)
Most recent A1c is  Lab Results  Component Value Date   HGBA1C 5.9 (H) 05/28/2022   with associated elevated insulin levels.  Patient informed of disease state and risk of progression. This may contribute to abnormal cravings, fatigue and diabetes complications without having diabetes.   Continue to work on nutritional strategies patient does benefit from incretin therapy.  She will be started on Saxenda.  Has had a difficult time obtaining Wegovy.

## 2022-09-20 ENCOUNTER — Other Ambulatory Visit: Payer: Self-pay

## 2022-09-24 ENCOUNTER — Telehealth (INDEPENDENT_AMBULATORY_CARE_PROVIDER_SITE_OTHER): Payer: Self-pay

## 2022-09-24 ENCOUNTER — Other Ambulatory Visit: Payer: Self-pay

## 2022-09-24 NOTE — Telephone Encounter (Signed)
Message from plan: [id number];Review Type:Prior Auth;Coverage Start Date:08/25/2022;Coverage End Date:01/22/2023;. Authorization Expiration Date: January 22, 2023.

## 2022-09-24 NOTE — Telephone Encounter (Signed)
PA for Saxenda started

## 2022-09-25 ENCOUNTER — Other Ambulatory Visit: Payer: Self-pay

## 2022-09-25 NOTE — Telephone Encounter (Signed)
PA - Kirke Shaggy is approved, pt notified.

## 2022-10-01 ENCOUNTER — Ambulatory Visit: Payer: 59 | Admitting: Neurosurgery

## 2022-10-07 ENCOUNTER — Ambulatory Visit (INDEPENDENT_AMBULATORY_CARE_PROVIDER_SITE_OTHER): Payer: 59 | Admitting: Internal Medicine

## 2022-10-14 ENCOUNTER — Telehealth (INDEPENDENT_AMBULATORY_CARE_PROVIDER_SITE_OTHER): Payer: 59 | Admitting: Psychology

## 2022-10-14 ENCOUNTER — Ambulatory Visit (INDEPENDENT_AMBULATORY_CARE_PROVIDER_SITE_OTHER): Payer: 59 | Admitting: Internal Medicine

## 2022-10-14 ENCOUNTER — Encounter (INDEPENDENT_AMBULATORY_CARE_PROVIDER_SITE_OTHER): Payer: Self-pay | Admitting: Internal Medicine

## 2022-10-14 VITALS — BP 128/79 | HR 79 | Temp 97.9°F | Ht 65.0 in | Wt 239.0 lb

## 2022-10-14 DIAGNOSIS — R7303 Prediabetes: Secondary | ICD-10-CM

## 2022-10-14 DIAGNOSIS — F419 Anxiety disorder, unspecified: Secondary | ICD-10-CM

## 2022-10-14 DIAGNOSIS — F5089 Other specified eating disorder: Secondary | ICD-10-CM | POA: Diagnosis not present

## 2022-10-14 DIAGNOSIS — Z6841 Body Mass Index (BMI) 40.0 and over, adult: Secondary | ICD-10-CM

## 2022-10-14 DIAGNOSIS — F32A Depression, unspecified: Secondary | ICD-10-CM | POA: Diagnosis not present

## 2022-10-14 MED ORDER — SAXENDA 18 MG/3ML ~~LOC~~ SOPN: 3.0000 mg | PEN_INJECTOR | Freq: Every day | SUBCUTANEOUS | 0 refills | Status: DC

## 2022-10-14 NOTE — Assessment & Plan Note (Signed)
Patient is followed by Dr. Mallie Mussel for cognitive behavioral therapy she has found sessions helpful and will continue.  She will continue to work also on nutritional and behavioral strategies and benefits from incretin therapy.

## 2022-10-14 NOTE — Assessment & Plan Note (Signed)
Most recent A1c is  Lab Results  Component Value Date   HGBA1C 5.9 (H) 05/28/2022   with associated elevated insulin levels.  Patient informed of disease state and risk of progression. This may contribute to abnormal cravings, fatigue and diabetes complications without having diabetes.   Continue to work on nutritional strategies, she is having a difficult time with sugary drinks but is making an effort to reduce consumption of these.  We will increase Saxenda to 3 mg a day.

## 2022-10-14 NOTE — Progress Notes (Signed)
  Office: [phone]  /  Fax: [phone]    Date: October 14, 2022    Appointment Start Time: 12:32pm Duration: 24 minutes Provider: Glennie Isle, Psy.D. Type of Session: Individual Therapy  Location of Patient: Work (private location) Location of Provider: Provider's Home (private office) Type of Contact: Telepsychological Visit via MyChart Video Visit  Session Content: Amanda Dixon is a 57 y.o. female presenting for a follow-up appointment to address the previously established treatment goal of increasing coping skills.Today's appointment was a telepsychological visit. Amanda Dixon provided verbal consent for today's telepsychological appointment and she is aware she is responsible for securing confidentiality on her end of the session. Prior to proceeding with today's appointment, Amanda Dixon's physical location at the time of this appointment was obtained as well a phone number she could be reached at in the event of technical difficulties. Amanda Dixon and this provider participated in today's telepsychological service.   This provider conducted a brief check-in. Amanda Dixon shared about recent events, including recent pain relief. She further shared she lost weight while on Wegovy, but recently gained weight when she did not have access to [facility]. She was recently prescribed Saxenda, noting she is experiencing side effects. She agreed to discuss further with Dr. Gerarda Fraction today during their appointment. Amanda Dixon further discussed experiencing anxiety due to ongoing stressors, noting an impact on eating habits and sleep. Psychoeducation provided regarding grounding exercises. She was engaged in an exercise (5-4-3-2-1). Amanda Dixon provided verbal consent during today's appointment for this provider to send the handout for today's exercise via e-mail. Overall, Amanda Dixon was receptive to today's appointment as evidenced by openness to sharing, responsiveness to feedback, and  willingness to engage in grounding techniques .  Mental Status  Examination:  Appearance: neat Behavior: appropriate to circumstances Mood: anxious Affect: mood congruent Speech: WNL Eye Contact: appropriate Psychomotor Activity: WNL Gait: unable to assess Thought Process: linear, logical, and goal directed and no evidence or endorsement of suicidal, homicidal, and self-harm ideation, plan and intent  Thought Content/Perception: no hallucinations, delusions, bizarre thinking or behavior endorsed or observed Orientation: AAOx4 Memory/Concentration: memory, attention, language, and fund of knowledge intact  Insight: fair Judgment: fair  Interventions:  Conducted a brief chart review Provided empathic reflections and validation Reviewed content from the previous session Employed supportive psychotherapy interventions to facilitate reduced distress and to improve coping skills with identified stressors Psychoeducation provided regarding grounding techniques Engaged pt in a grounding exercise  DSM-5 Diagnosis(es):  F50.89 Other Specified Feeding or Eating Disorder, Emotional Eating Behaviors, F41.9 Unspecified Anxiety Disorder, and  F32.A Unspecified Depressive Disorder  Treatment Goal & Progress: During the initial appointment with this provider, the following treatment goal was established: increase coping skills. Amanda Dixon has demonstrated progress in her goal as evidenced by increased awareness of hunger patterns, increased awareness of triggers for emotional eating behaviors, and reduction in emotional eating behaviors . Amanda Dixon also continues to demonstrate willingness to engage in learned skill(s).  Plan: The next appointment is scheduled for 10/29/2022 at 11:30am, which will be via MyChart Video Visit. The next session will focus on working towards the established treatment goal. Additionally, Amanda Dixon will establish care with [facility].

## 2022-10-14 NOTE — Progress Notes (Signed)
Office: [phone]  /  Fax: [phone]  WEIGHT SUMMARY AND BIOMETRICS  Vitals Temp: 97.9 F (36.6 C) BP: 128/79 Pulse Rate: 79 SpO2: 97 %   Anthropometric Measurements Height: 5\' 5"  (1.651 m) Weight: 239 lb (108.4 kg) BMI (Calculated): 39.77 Weight at Last Visit: 241 lb Weight Lost Since Last Visit: 2 lb Starting Weight: 247 lb Total Weight Loss (lbs): 11 lb (4.99 kg) Peak Weight: 239 lb   Body Composition  Body Fat %: 47.5 % Fat Mass (lbs): 113.8 lbs Muscle Mass (lbs): 113.8 lbs Total Body Water (lbs): 83 lbs Visceral Fat Rating : 15    HPI  Chief Complaint: OBESITY  Amanda Dixon is here to discuss her progress with her obesity treatment plan. She is on the the Category 3 Plan and states she is following her eating plan approximately 60-70 % of the time. She states she is not exercising.  Interval History:  Amanda Dixon presents today for short interval follow-up.  Since last office visit she has lost 2 pounds.  She is working with Dr. Mallie Mussel for cognitive behavioral therapy and has found interactions helpful.  She has also started [place] and has noticed an improvement in hunger signals as well as appetite.  She continues to have a difficult time controlling enticed meant for sugary drinks.  She recently received a steroid injection and reports improvement in pain as a result she has been more active and is looking forward to beginning an exercise program.  She has also been tracking and journaling some days.  Pharmacotherapy for weight loss: She is currently taking Saxenda 1.8 mg without any adverse effects.  She does experience some upset stomach if she eats the wrong foods.   ASSESSMENT AND PLAN  TREATMENT PLAN FOR OBESITY:  Recommended Dietary Goals  Amanda Dixon is currently in the action stage of change. As such, her goal is to continue weight management plan. She has agreed to: the Category 3 Plan.  Behavioral Intervention  We discussed the following Behavioral  Modification Strategies today: increasing lean protein intake, increasing vegetables, increasing water intake, identifying sources and decreasing liquid calories, work on meal planning and easy cooking plans, work on tracking and journaling calories using tracking App, and reading food labels .  Additional resources provided today: None  Recommended Physical Activity Goals  Amanda Dixon has been advised to work up to 150 minutes of moderate intensity aerobic activity a week and strengthening exercises 2-3 times per week for cardiovascular health, weight loss maintenance and preservation of muscle mass.   She has agreed to :  Think about ways to increase physical activity  Pharmacotherapy We discussed various medication options to help Amanda Dixon with her weight loss efforts and we both agreed to : increase Saxenda to 2.4 mg daily for 1 week and then increase to 3 mg a day  ASSOCIATED CONDITIONS ADDRESSED TODAY  Prediabetes Assessment & Plan: Most recent A1c is  Lab Results  Component Value Date   HGBA1C 5.9 (H) 05/28/2022   with associated elevated insulin levels.  Patient informed of disease state and risk of progression. This may contribute to abnormal cravings, fatigue and diabetes complications without having diabetes.   Continue to work on nutritional strategies, she is having a difficult time with sugary drinks but is making an effort to reduce consumption of these.  We will increase Saxenda to 3 mg a day.    Other Specified Feeding or Eating Disorder, Emotional Eating Behaviors Assessment & Plan: Patient is followed by Dr. Mallie Mussel for cognitive  behavioral therapy she has found sessions helpful and will continue.  She will continue to work also on nutritional and behavioral strategies and benefits from incretin therapy.   Obesity, Class III, BMI 40-49.9 (morbid obesity) (HCC)     PHYSICAL EXAM:  Blood pressure 128/79, pulse 79, temperature 97.9 F (36.6 C), height 5\' 5"  (1.651 m),  weight 239 lb (108.4 kg), SpO2 97 %. Body mass index is 39.77 kg/m.  General: She is overweight, cooperative, alert, well developed, and in no acute distress. PSYCH: Has normal mood, affect and thought process.   HEENT: EOMI, sclerae are anicteric. Lungs: Normal breathing effort, no conversational dyspnea. Extremities: No edema.  Neurologic: No gross sensory or motor deficits. No tremors or fasciculations noted.    DIAGNOSTIC DATA REVIEWED:  BMET    Component Value Date/Time   NA 140 05/28/2022 1416   K 4.6 05/28/2022 1416   CL 100 05/28/2022 1416   CO2 21 05/28/2022 1416   GLUCOSE 91 05/28/2022 1416   GLUCOSE 91 08/21/2021 0832   BUN 13 05/28/2022 1416   CREATININE 0.68 05/28/2022 1416   CALCIUM 9.4 05/28/2022 1416   GFRNONAA >60 08/06/2021 1746   GFRAA 100 01/27/2019 1204   Lab Results  Component Value Date   HGBA1C 5.9 (H) 05/28/2022   HGBA1C 5.5 01/27/2019   Lab Results  Component Value Date   INSULIN 24.3 05/14/2022   Lab Results  Component Value Date   TSH 2.340 05/28/2022   CBC    Component Value Date/Time   WBC 14.5 (H) 05/28/2022 1416   WBC 11.1 (H) 08/06/2021 1746   RBC 4.50 05/28/2022 1416   RBC 4.42 08/06/2021 1746   HGB 13.4 05/28/2022 1416   HCT 40.1 05/28/2022 1416   PLT 321 08/06/2021 1746   MCV 89 05/28/2022 1416   MCH 29.8 05/28/2022 1416   MCH 30.8 08/06/2021 1746   MCHC 33.4 05/28/2022 1416   MCHC 33.1 08/06/2021 1746   RDW 13.1 05/28/2022 1416   Iron Studies No results found for: "IRON", "TIBC", "FERRITIN", "IRONPCTSAT" Lipid Panel     Component Value Date/Time   CHOL 174 05/14/2022 1103   TRIG 151 (H) 05/14/2022 1103   HDL 48 05/14/2022 1103   CHOLHDL 3 07/10/2021 0850   VLDL 19.4 07/10/2021 0850   LDLCALC 100 (H) 05/14/2022 1103   Hepatic Function Panel     Component Value Date/Time   PROT 7.4 05/28/2022 1416   ALBUMIN 4.4 05/28/2022 1416   AST 21 05/28/2022 1416   ALT 13 05/28/2022 1416   ALKPHOS 73 05/28/2022 1416    BILITOT 0.2 05/28/2022 1416      Component Value Date/Time   TSH 2.340 05/28/2022 1416   Nutritional Lab Results  Component Value Date   VD25OH 27.1 (L) 05/28/2022     Return in about 2 weeks (around 10/28/2022) for For Weight Mangement with Dr. Gerarda Fraction.Marland Kitchen She was informed of the importance of frequent follow up visits to maximize her success with intensive lifestyle modifications for her multiple health conditions.   ATTESTASTION STATEMENTS:  Reviewed by clinician on day of visit: allergies, medications, problem list, medical history, surgical history, family history, social history, and previous encounter notes.     Thomes Dinning, MD

## 2022-10-16 ENCOUNTER — Ambulatory Visit (INDEPENDENT_AMBULATORY_CARE_PROVIDER_SITE_OTHER): Payer: 59 | Admitting: Internal Medicine

## 2022-10-21 ENCOUNTER — Encounter (INDEPENDENT_AMBULATORY_CARE_PROVIDER_SITE_OTHER): Payer: Self-pay | Admitting: Internal Medicine

## 2022-10-29 ENCOUNTER — Ambulatory Visit (INDEPENDENT_AMBULATORY_CARE_PROVIDER_SITE_OTHER): Payer: 59 | Admitting: Internal Medicine

## 2022-10-29 ENCOUNTER — Telehealth (INDEPENDENT_AMBULATORY_CARE_PROVIDER_SITE_OTHER): Payer: 59 | Admitting: Psychology

## 2022-10-29 DIAGNOSIS — F5089 Other specified eating disorder: Secondary | ICD-10-CM

## 2022-10-29 DIAGNOSIS — F32A Depression, unspecified: Secondary | ICD-10-CM | POA: Diagnosis not present

## 2022-10-29 DIAGNOSIS — F419 Anxiety disorder, unspecified: Secondary | ICD-10-CM

## 2022-10-29 NOTE — Progress Notes (Signed)
  Office: [phone]  /  Fax: [phone]    Date: October 29, 2022    Appointment Start Time: 11:35am Duration: 22 minutes Provider: Glennie Isle, Psy.D. Type of Session: Individual Therapy  Location of Patient: Work (private location) Location of Provider: [employer] (private office) Type of Contact: Telepsychological Visit via MyChart Video Visit  Session Content: This provider called Amanda Dixon at 11:34am as she did not present for today's appointment. She acknowledged she lost track of time at work, but could sign in. As such, today's appointment was initiated 5 minutes late.Amanda Dixon is a 57 y.o. female presenting for a follow-up appointment to address the previously established treatment goal of increasing coping skills.Today's appointment was a telepsychological visit. Amanda Dixon provided verbal consent for today's telepsychological appointment and she is aware she is responsible for securing confidentiality on her end of the session. Prior to proceeding with today's appointment, Amanda Dixon's physical location at the time of this appointment was obtained as well a phone number she could be reached at in the event of technical difficulties. Amanda Dixon and this provider participated in today's telepsychological service.   This provider conducted a brief check-in. Amanda Dixon shared about recent events, including health concerns for loved ones. She also shared about challenges with Saxenda. Associated thoughts and feelings were processed. Regarding eating habits, she feels there has been an improvement, but stated she "struggle[s]" with eating everything on her prescribed structured meal plan. Further explored. She was engaged in problem solving to help her eat regularly and congruent to her structured meal plan (e.g., leave snacks/foods at work; develop designated areas for self in the refrigerator and pantry at home; set reminders on phone; eat smaller/frequent snacks/meals). Amanda Dixon was receptive to today's appointment as  evidenced by openness to sharing, responsiveness to feedback, and willingness to implement discussed strategies .  Mental Status Examination:  Appearance: neat Behavior: appropriate to circumstances Mood: neutral Affect: mood congruent Speech: WNL Eye Contact: appropriate Psychomotor Activity: WNL Gait: unable to assess Thought Process: linear, logical, and goal directed and no evidence or endorsement of suicidal, homicidal, and self-harm ideation, plan and intent  Thought Content/Perception: no hallucinations, delusions, bizarre thinking or behavior endorsed or observed Orientation: AAOx4 Memory/Concentration: memory, attention, language, and fund of knowledge intact  Insight: fair Judgment: fair  Interventions:  Conducted a brief chart review Provided empathic reflections and validation Employed supportive psychotherapy interventions to facilitate reduced distress and to improve coping skills with identified stressors Engaged patient in problem solving  DSM-5 Diagnosis(es):  F50.89 Other Specified Feeding or Eating Disorder, Emotional Eating Behaviors, F41.9 Unspecified Anxiety Disorder, and  F32.A Unspecified Depressive Disorder  Treatment Goal & Progress: During the initial appointment with this provider, the following treatment goal was established: increase coping skills. Amanda Dixon has demonstrated progress in her goal as evidenced by increased awareness of hunger patterns, increased awareness of triggers for emotional eating behaviors, and reduction in emotional eating behaviors . Amanda Dixon also continues to demonstrate willingness to engage in learned skill(s).  Plan: Based on appointment availability and Amanda Dixon's schedule, the next appointment is scheduled for 11/25/2022 at 11:30am, which will be via MyChart Video Visit. The next session will focus on working towards the established treatment goal.

## 2022-11-05 IMAGING — MG MM DIGITAL SCREENING BILAT W/ TOMO AND CAD
6 of 10 series · 6 of 30 positions shown · non-contrast
Comparison: Previous exam(s).

CLINICAL DATA: Screening.

EXAM:
DIGITAL SCREENING BILATERAL MAMMOGRAM WITH TOMOSYNTHESIS AND CAD
TECHNIQUE: Bilateral screening digital craniocaudal and mediolateral oblique
mammograms were obtained. Bilateral screening digital breast
tomosynthesis was performed. The images were evaluated with
computer-aided detection.

[L MLO synth-2D]
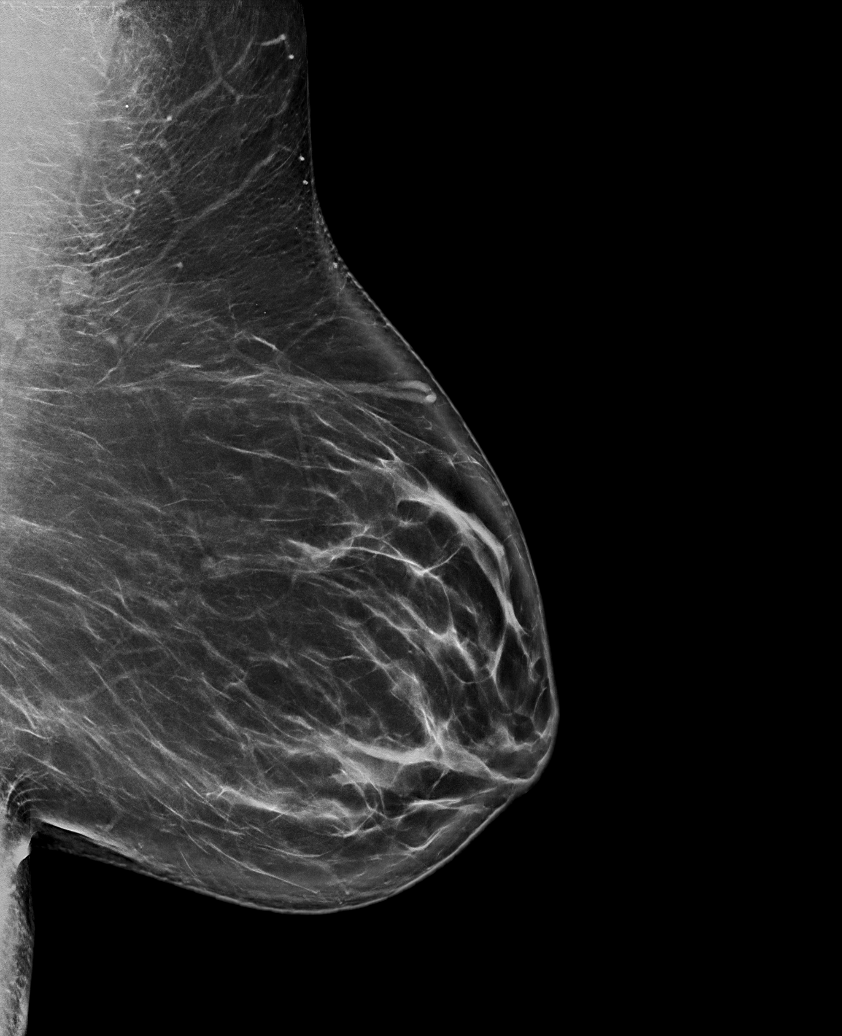

[L CC synth-2D]
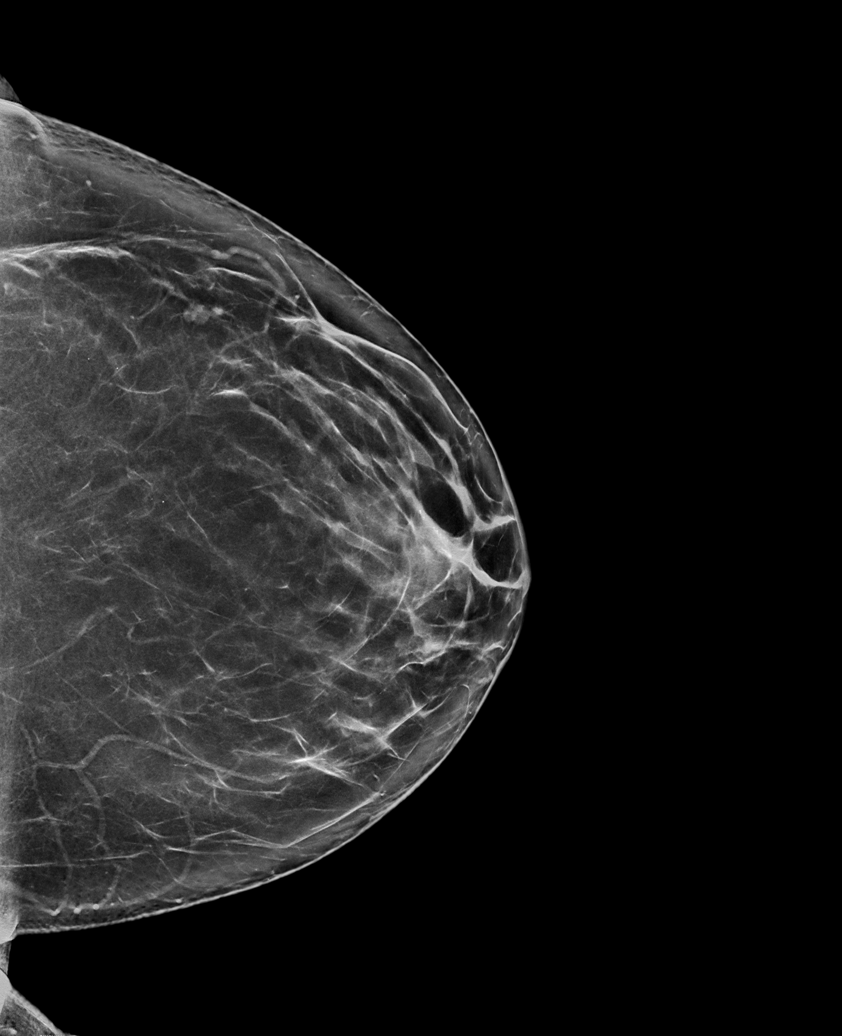

[R MLO synth-2D (1 of 2)]
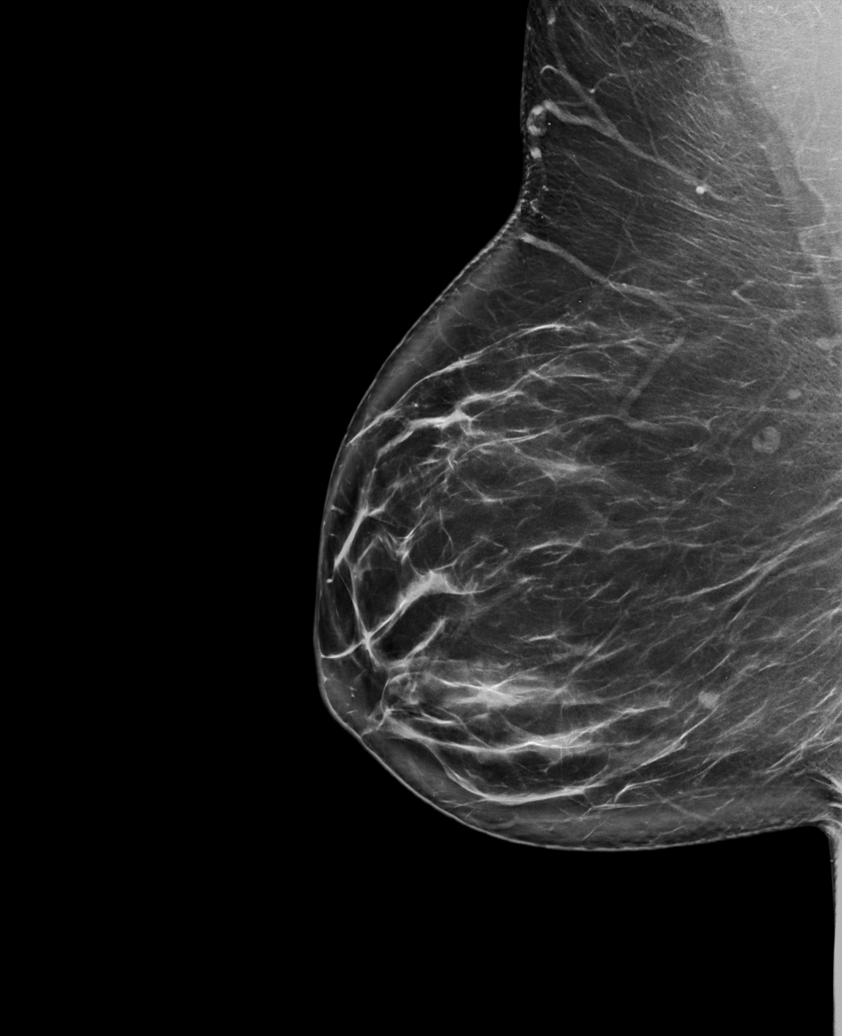

[R MLO synth-2D (2 of 2)]
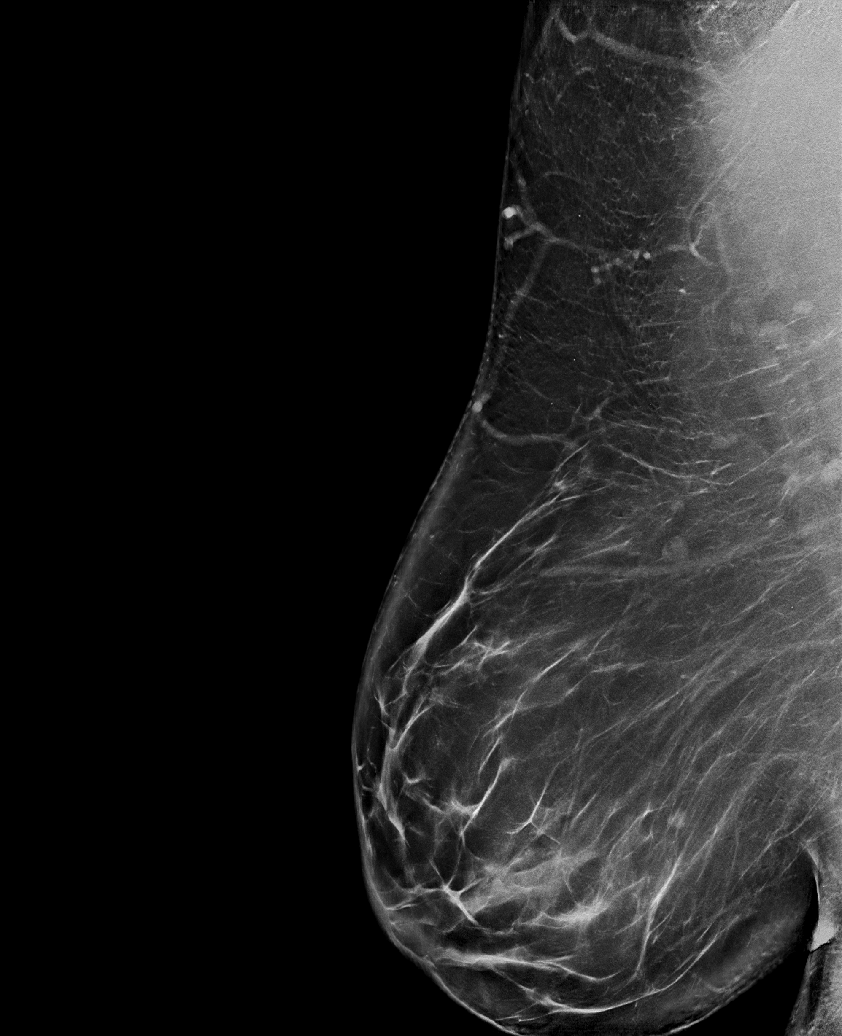

[R CC synth-2D]
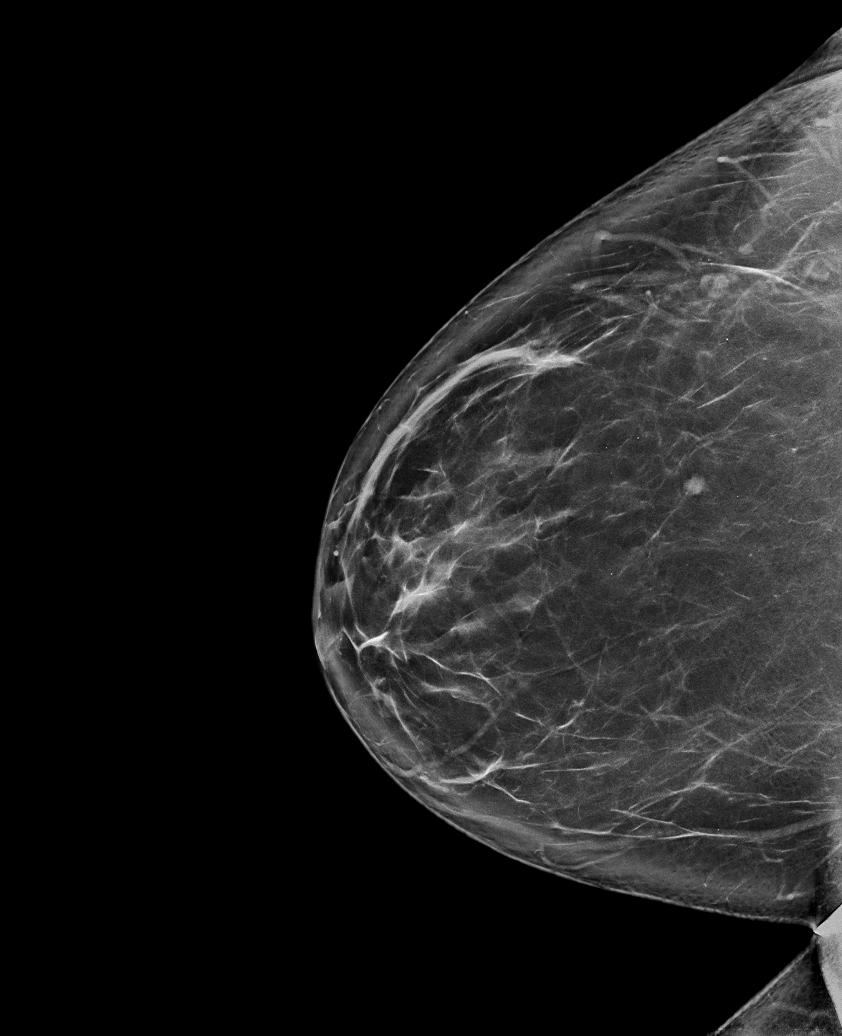

[L MLO tomo · tomo slice 49/98.0]
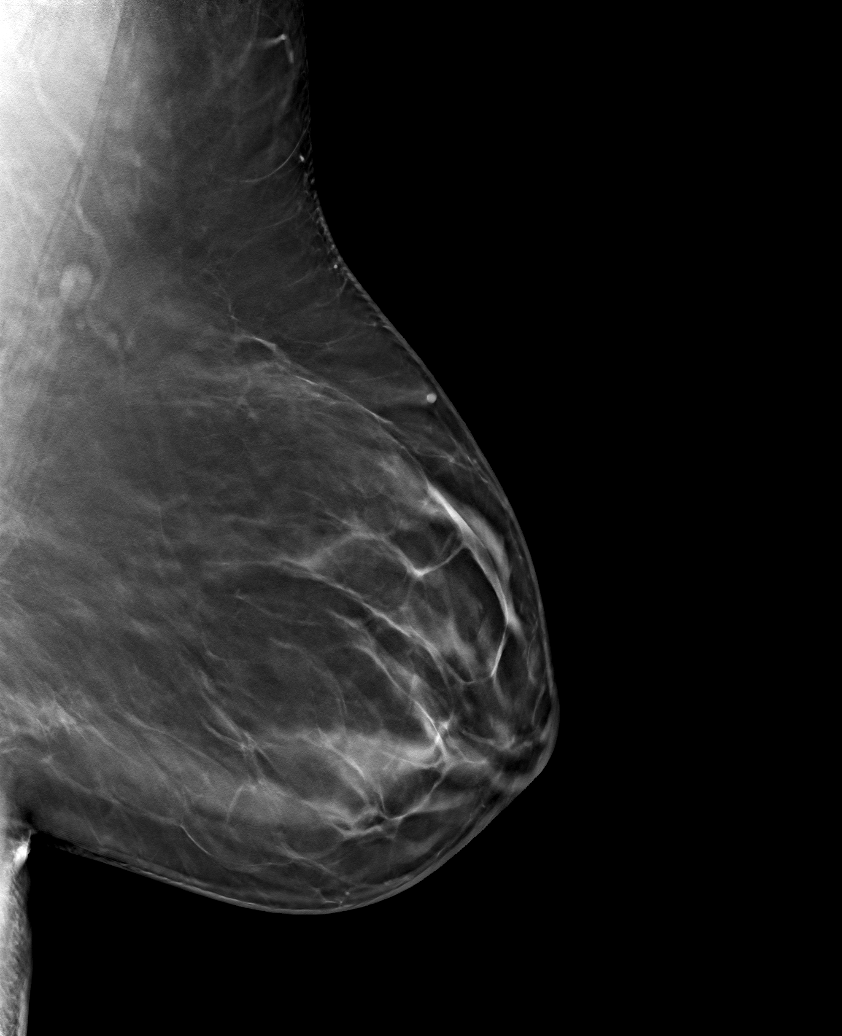

[6 of 30 positions shown; findings below may reference images not displayed]

ACR Breast Density Category b: There are scattered areas of
fibroglandular density.
FINDINGS: There are no findings suspicious for malignancy.
IMPRESSION: No mammographic evidence of malignancy. A result letter of this
screening mammogram will be mailed directly to the patient.

RECOMMENDATION:
Screening mammogram in one year. (Code:51-O-LD2)

BI-RADS CATEGORY  1: Negative.

## 2022-11-07 ENCOUNTER — Telehealth (INDEPENDENT_AMBULATORY_CARE_PROVIDER_SITE_OTHER): Payer: 59 | Admitting: Family Medicine

## 2022-11-07 ENCOUNTER — Encounter (INDEPENDENT_AMBULATORY_CARE_PROVIDER_SITE_OTHER): Payer: Self-pay | Admitting: Family Medicine

## 2022-11-07 DIAGNOSIS — Z6841 Body Mass Index (BMI) 40.0 and over, adult: Secondary | ICD-10-CM | POA: Diagnosis not present

## 2022-11-07 DIAGNOSIS — G4730 Sleep apnea, unspecified: Secondary | ICD-10-CM

## 2022-11-07 DIAGNOSIS — G4733 Obstructive sleep apnea (adult) (pediatric): Secondary | ICD-10-CM | POA: Diagnosis not present

## 2022-11-07 DIAGNOSIS — R7303 Prediabetes: Secondary | ICD-10-CM | POA: Diagnosis not present

## 2022-11-07 MED ORDER — WEGOVY 0.25 MG/0.5ML ~~LOC~~ SOAJ: 0.2500 mg | SUBCUTANEOUS | 0 refills | Status: DC

## 2022-11-07 NOTE — Progress Notes (Deleted)
  TeleHealth Visit:  This visit was completed with telemedicine (audio/video) technology. Amanda Dixon has verbally consented to this TeleHealth visit. The patient is located at home, the provider is located at home. The participants in this visit include the listed provider and patient. The visit was conducted today via MyChart video.  OBESITY Amanda Dixon is here to discuss her progress with her obesity treatment plan along with follow-up of her obesity related diagnoses.   Today's visit was # 10 Starting weight: 247 lbs Starting date: 05/14/2022 Weight at last in office visit: 239 lbs on 10/14/22 Total weight loss: 8 lbs at last in office visit on 10/14/22. Today's reported weight (11/07/22): {dwwweightreported:29243}  Nutrition Plan: the Category 3 plan - ***% adherence.  Current exercise: {exercise types:16438}  Interim History:  ***  Eating all of the prescribed protein: {yes***/no:17258} Skipping meals: {dwwyes:29172} Drinking adequate water: {dwwyes:29172} Drinking sugar sweetened beverages: {dwwyes:29172} Hunger controlled: {EWCONTROLASSESSMENT:24261}. Cravings controlled:  {EWCONTROLASSESSMENT:24261}.  Journaling Consistently:  {dwwyes:29172} Meeting protein goals:  {dwwyes:29172} Meeting calorie goals:  {dwwyes:29172}   Pharmacotherapy: Amanda Dixon is on {dwwpharmacotherapy:29109} Adverse side effects: {dwwse:29122} Hunger is {EWCONTROLASSESSMENT:24261}.  Cravings are {EWCONTROLASSESSMENT:24261}.  Assessment/Plan:  1. Prediabetes Last A1c was 5.9  Medication(s): {dwwpharmacotherapy:29109} Lab Results  Component Value Date   HGBA1C 5.9 (H) 05/28/2022   HGBA1C 5.9 12/21/2021   HGBA1C 5.7 06/01/2021   HGBA1C 5.7 01/25/2020   HGBA1C 5.5 01/27/2019   Lab Results  Component Value Date   INSULIN 24.3 05/14/2022    Plan: {dwwmed:29123} {dwwpharmacotherapy:29109}   2. ***  3. Morbid Obesity: Current BMI 39 Pharmacotherapy Plan {dwwmed:29123}   {dwwpharmacotherapy:29109} Amanda Dixon {CHL AMB IS/IS NOT:210130109} currently in the action stage of change. As such, her goal is to {MWMwtloss#1:210800005}.  She has agreed to {dwwsldiets:29085}.  Exercise goals: {MWM EXERCISE RECS:23473}  Behavioral modification strategies: {dwwslwtlossstrategies:29088}.  Amanda Dixon has agreed to follow-up with our clinic in {NUMBER 1-10:22536} weeks.   No orders of the defined types were placed in this encounter.   There are no discontinued medications.   No orders of the defined types were placed in this encounter.     Objective:   VITALS: Per patient if applicable, see vitals. GENERAL: Alert and in no acute distress. CARDIOPULMONARY: No increased WOB. Speaking in clear sentences.  PSYCH: Pleasant and cooperative. Speech normal rate and rhythm. Affect is appropriate. Insight and judgement are appropriate. Attention is focused, linear, and appropriate.  NEURO: Oriented as arrived to appointment on time with no prompting.   Attestation Statements:   Reviewed by clinician on day of visit: allergies, medications, problem list, medical history, surgical history, family history, social history, and previous encounter notes.  ***(delete if time-based billing not used) Time spent on visit including the items listed below was *** minutes.  -preparing to see the patient (e.g., review of tests, history, previous notes) -obtaining and/or reviewing separately obtained history -counseling and educating the patient/family/caregiver -documenting clinical information in the electronic or other health record -ordering medications, tests, or procedures -independently interpreting results and communicating results to the patient/ family/caregiver -referring and communicating with other health care professionals  -care coordination   This was prepared with the assistance of Engineer, civil (consulting).  Occasional wrong-word or sound-a-like substitutions may have occurred  due to the inherent limitations of voice recognition software.

## 2022-11-07 NOTE — Progress Notes (Signed)
TeleHealth Visit:  This visit was completed with telemedicine (audio/video) technology. [name] has verbally consented to this TeleHealth visit. The patient is located at home, the provider is located at home. The participants in this visit include the listed provider and patient. The visit was conducted today via MyChart video.  OBESITY Amanda Dixon is here to discuss her progress with her obesity treatment plan along with follow-up of her obesity related diagnoses.   Today's visit was # 10 Starting weight: 247 lbs Starting date: 05/14/2022 Weight at last in office visit: 239 lbs on 10/14/22 Total weight loss: 8 lbs at last in office visit on 10/14/22. Today's reported weight (11/07/22): none reported  Nutrition Plan: the Category 3 plan - 75% adherence.  Current exercise:  PT 3 times per week .   Interim History:  She was on Wegovy previously but it was discontinued due to unavailability. She tolerated it well and it worked well for her. She was switched to [facility] and had terrible GI side effects. She has been off of Saxenda for 2 weeks. She got braces recently and has been choosing soft foods like milkshakes, spaghetti. Often has a [employer] protein shake rather than breakfast. Sometimes she does not eat the food on the plan because she just does not want it. She has had success in the past with journaling.  Recently had 2 epidural injections for back pain- has 5 herniated discs.  Eating all of the prescribed protein: no Skipping meals: Yes- breakfast. Drinking adequate water: Yes Drinking sugar sweetened beverages: Yes- daily. Hunger controlled: moderately controlled. Cravings controlled:  moderately controlled.  Assessment/Plan:  1. Obstructive Sleep Apnea [name] has a diagnosis of severe sleep apnea. She reports that she is not using a CPAP since she got her braces.   Plan: Informed patient of negative health ramifications of noncompliance with CPAP. Once pain from braces  improves, start using CPAP. If needed obtain new mask or nasal pillows.   2. Prediabetes Last A1c was 5.9 on 05/28/2022. Medication(s): None Lab Results  Component Value Date   HGBA1C 5.9 (H) 05/28/2022   HGBA1C 5.9 12/21/2021   HGBA1C 5.7 06/01/2021   HGBA1C 5.7 01/25/2020   HGBA1C 5.5 01/27/2019   Lab Results  Component Value Date   INSULIN 24.3 05/14/2022    Plan: Start Wegovy 0.25 mg SQ weekly   3. Morbid Obesity: Current BMI 41 Pharmacotherapy Plan Start  Wegovy 0.25 mg SQ weekly [name] is currently in the action stage of change. As such, her goal is to continue with weight loss efforts.  She has agreed to keeping a food journal with goal of 1500 calories and 100 grams of protein daily.  1.  Begin journaling consistently.  She has access to weight watchers app and My Fitness Pal. 2. Prioritize meeting protein goal. 3.  May continue having 1 protein shake per day. 4.  Cut out if possible or significantly reduce soda intake.  Exercise goals: No exercise has been prescribed at this time.  Behavioral modification strategies: increasing lean protein intake, decreasing simple carbohydrates , meal planning , decrease liquid calories, and planning for success.  [name] has agreed to follow-up with our clinic in 4 weeks.   No orders of the defined types were placed in this encounter.   Medications Discontinued During This Encounter  Medication Reason   Liraglutide -Weight Management (SAXENDA) 18 MG/3ML SOPN Side effect (s)     Meds ordered this encounter  Medications   Semaglutide-Weight Management (WEGOVY) 0.25 MG/0.5ML SOAJ  Sig: Inject 0.25 mg into the skin once a week.    Dispense:  2 mL    Refill:  0    Order Specific Question:   Supervising Provider    Answer:   Glennis Brink [[id number]]      Objective:   VITALS: Per patient if applicable, see vitals. GENERAL: Alert and in no acute distress. CARDIOPULMONARY: No increased WOB. Speaking in clear sentences.   PSYCH: Pleasant and cooperative. Speech normal rate and rhythm. Affect is appropriate. Insight and judgement are appropriate. Attention is focused, linear, and appropriate.  NEURO: Oriented as arrived to appointment on time with no prompting.   Attestation Statements:   Reviewed by clinician on day of visit: allergies, medications, problem list, medical history, surgical history, family history, social history, and previous encounter notes.  This was prepared with the assistance of Engineer, civil (consulting).  Occasional wrong-word or sound-a-like substitutions may have occurred due to the inherent limitations of voice recognition software.

## 2022-11-11 ENCOUNTER — Telehealth: Payer: Self-pay

## 2022-11-11 NOTE — Telephone Encounter (Signed)
[id number];MVVKPQ:[id number];Review Type:Prior Auth;Coverage Start Date:10/12/2022;Coverage End Date:06/09/2023;

## 2022-11-11 NOTE — Telephone Encounter (Signed)
PA submitted through Cover My Meds for [facility] Awaiting insurance determination. Key: [id number]

## 2022-11-12 ENCOUNTER — Ambulatory Visit: Payer: 59 | Admitting: Neurosurgery

## 2022-11-18 ENCOUNTER — Other Ambulatory Visit (HOSPITAL_COMMUNITY): Payer: Self-pay

## 2022-11-18 MED ORDER — WEGOVY 0.25 MG/0.5ML ~~LOC~~ SOAJ
0.2500 mg | SUBCUTANEOUS | 0 refills | Status: DC
  Filled 2022-11-18: qty 2, 28d supply, fill #0

## 2022-11-19 ENCOUNTER — Other Ambulatory Visit (HOSPITAL_COMMUNITY): Payer: Self-pay

## 2022-11-20 ENCOUNTER — Other Ambulatory Visit (HOSPITAL_COMMUNITY): Payer: Self-pay

## 2022-11-25 ENCOUNTER — Telehealth (INDEPENDENT_AMBULATORY_CARE_PROVIDER_SITE_OTHER): Payer: 59 | Admitting: Psychology

## 2022-11-25 DIAGNOSIS — F32A Depression, unspecified: Secondary | ICD-10-CM

## 2022-11-25 DIAGNOSIS — F419 Anxiety disorder, unspecified: Secondary | ICD-10-CM | POA: Diagnosis not present

## 2022-11-25 DIAGNOSIS — F5089 Other specified eating disorder: Secondary | ICD-10-CM

## 2022-11-25 NOTE — Progress Notes (Signed)
  Office: [phone]  /  Fax: [phone]    Date: November 25, 2022    Appointment Start Time: 11:33am Duration: 21 minutes Provider: Lawerance Cruel, Psy.D. Type of Session: Individual Therapy  Location of Patient: Work (private location) Location of Provider: Provider's Home (private office) Type of Contact: Telepsychological Visit via MyChart Video Visit  Session Content: Amanda Dixon is a 57 y.o. female presenting for a follow-up appointment to address the previously established treatment goal of increasing coping skills.Today's appointment was a telepsychological visit. Amanda Dixon provided verbal consent for today's telepsychological appointment and she is aware she is responsible for securing confidentiality on her end of the session. Prior to proceeding with today's appointment, Amanda Dixon's physical location at the time of this appointment was obtained as well a phone number she could be reached at in the event of technical difficulties. Amanda Dixon and this provider participated in today's telepsychological service.   This provider conducted a brief check-in. Amanda Dixon shared about recent stressors. Associated thoughts/feelings explored and processed. Regarding weight loss/eating habits, she shared she recently started [facility] and is maintaining her weight. Despite recent stressors, Amanda Dixon described a reduction in emotional eating behaviors. She also described making better choices and engaging in portion control. Amanda Dixon acknowledged challenges with consuming sodas (2-20oz bottles a day). To help with the aforementioned, she was engaged in goal setting. Psychoeducation regarding SMART goals was provided and Amanda Dixon was engaged in goal setting. The following goal was established: Amanda Dixon will consume only 2-12oz soda bottles at least 4 out of 7 between now and the next appointment with this provider. Overall, Amanda Dixon was receptive to today's appointment as evidenced by openness to sharing, responsiveness to feedback, and willingness to  work toward the established SMART goal.  Mental Status Examination:  Appearance: neat Behavior: appropriate to circumstances Mood: neutral Affect: mood congruent Speech: WNL Eye Contact: appropriate Psychomotor Activity: WNL Gait: unable to assess Thought Process: linear, logical, and goal directed and no evidence or endorsement of suicidal, homicidal, and self-harm ideation, plan and intent  Thought Content/Perception: no hallucinations, delusions, bizarre thinking or behavior endorsed or observed Orientation: AAOx4 Memory/Concentration: memory, attention, language, and fund of knowledge intact  Insight: fair Judgment: fair  Interventions:  Conducted a brief chart review Provided empathic reflections and validation Provided positive reinforcement Employed supportive psychotherapy interventions to facilitate reduced distress and to improve coping skills with identified stressors Engaged patient in goal setting Psychoeducation provided regarding SMART goals  DSM-5 Diagnosis(es):  F50.89 Other Specified Feeding or Eating Disorder, Emotional Eating Behaviors, F41.9 Unspecified Anxiety Disorder, and  F32.A Unspecified Depressive Disorder  Treatment Goal & Progress: During the initial appointment with this provider, the following treatment goal was established: increase coping skills. Amanda Dixon has demonstrated progress in her goal as evidenced by increased awareness of hunger patterns, increased awareness of triggers for emotional eating behaviors, and reduction in emotional eating behaviors . Amanda Dixon also continues to demonstrate willingness to engage in learned skill(s).  Plan: The next appointment is scheduled for 12/16/2022 at 11:30am, which will be via MyChart Video Visit. The next session will focus on working towards the established treatment goal.

## 2022-12-05 ENCOUNTER — Other Ambulatory Visit (HOSPITAL_COMMUNITY): Payer: Self-pay

## 2022-12-05 ENCOUNTER — Encounter (INDEPENDENT_AMBULATORY_CARE_PROVIDER_SITE_OTHER): Payer: Self-pay | Admitting: Internal Medicine

## 2022-12-05 ENCOUNTER — Ambulatory Visit (INDEPENDENT_AMBULATORY_CARE_PROVIDER_SITE_OTHER): Payer: 59 | Admitting: Internal Medicine

## 2022-12-05 VITALS — BP 123/75 | HR 89 | Temp 97.9°F | Ht 65.0 in | Wt 235.0 lb

## 2022-12-05 DIAGNOSIS — R7303 Prediabetes: Secondary | ICD-10-CM | POA: Diagnosis not present

## 2022-12-05 DIAGNOSIS — I10 Essential (primary) hypertension: Secondary | ICD-10-CM | POA: Diagnosis not present

## 2022-12-05 DIAGNOSIS — R638 Other symptoms and signs concerning food and fluid intake: Secondary | ICD-10-CM | POA: Diagnosis not present

## 2022-12-05 DIAGNOSIS — Z6839 Body mass index (BMI) 39.0-39.9, adult: Secondary | ICD-10-CM

## 2022-12-05 MED ORDER — SEMAGLUTIDE-WEIGHT MANAGEMENT 0.5 MG/0.5ML ~~LOC~~ SOAJ
0.5000 mg | SUBCUTANEOUS | 0 refills | Status: DC
  Filled 2022-12-05: qty 2, 28d supply, fill #0

## 2022-12-05 NOTE — Assessment & Plan Note (Signed)
Blood pressure at goal for age and risk category.  On losartan 25 mg daily without adverse effects.  Most recent renal parameters reviewed which showed normal electrolytes and kidney function.  Continue with weight loss therapy.  Monitor for symptoms of orthostasis while losing weight. Continue current regimen and home monitoring for a goal blood pressure of 120/80.

## 2022-12-05 NOTE — Progress Notes (Signed)
Office: [phone]  /  Fax: [phone]  WEIGHT SUMMARY AND BIOMETRICS  Vitals Temp: 97.9 F (36.6 C) BP: 123/75 Pulse Rate: 89 SpO2: 97 %   Anthropometric Measurements Height: 5\' 5"  (1.651 m) Weight: 235 lb (106.6 kg) BMI (Calculated): 39.11 Weight at Last Visit: 239 lb Weight Lost Since Last Visit: 4 lb Starting Weight: 247 lb Total Weight Loss (lbs): 15 lb (6.804 kg) Peak Weight: 239 lb   Body Composition  Body Fat %: 42.5 % Fat Mass (lbs): 100.2 lbs Muscle Mass (lbs): 128.6 lbs Total Body Water (lbs): 84.2 lbs Visceral Fat Rating : 13    No data recorded Today's Visit #: 8  Starting Date: 05/14/22   HPI  Chief Complaint: OBESITY  Amanda Dixon is here to discuss her progress with her obesity treatment plan. She is on the the Category 3 Plan and states she is following her eating plan approximately 75-80 % of the time. She states she is not exercising.  Interval History:  Since last office visit she has [x]  lost []  maintained [] gained weight. Has had 2 epidural shots and undergoing PT. she was seen by Dr. Dewaine Dixon for cognitive behavioral therapy and also seen virtually by our nurse practitioner.  She has been switched to [facility] from [place].  According to records she was starting to get GI side effects to [place].  She had been on Wegovy in the past without adverse effects.  Adherence to nutrition plan :  []  Excellent [x]  Good []  Fair []  Suboptimal []  Variable []  Gradually implementing [] Has not started implementation.   Nutritional: Has been: [x]  eating more fruits []  eating more vegetables []  eating more whole grains []  drinking more low calorie drinks []  reducing portions []  eating out less []  reducing consumption of processed foods []  consuming prepackaged meals []  using protein shakes as a meal []  reading food labels  []  tracking or journaling calories. She continues to struggle with sugary drinks.  She drinks 2-3 soft drinks a day.  She enjoys soda makes her  feel good.  Orexigenic Control: [x] Denies [] Reports problems with appetite and hunger signals.  [x] Denies [] Reports problems with satiety and satiation.  [] Denies [x] Reports problems with eating patterns and portion control.  [] Denies [x] Reports strong cravings for highly palatable foods [x] Denies [] Reports problems with feeling restricted or deprived  Stress levels: [x]  Low [] Medium [] High   Barriers identified: strong hunger signals and appetite, having difficulty focusing on healthy eating, and exposure to enticing environments and or relationships.   Pharmacotherapy for weight loss: She is currently taking Wegovy.    ASSESSMENT AND PLAN  TREATMENT PLAN FOR OBESITY:  Recommended Dietary Goals  Amanda Dixon is currently in the action stage of change. As such, her goal is to continue weight management plan. She has agreed to: continue current plan  Behavioral Intervention  We discussed the following Behavioral Modification Strategies today: increasing lean protein intake, decreasing simple carbohydrates , increasing vegetables, increasing lower glycemic fruits, increasing water intake, work on tracking and journaling calories using tracking application, and planning for success.  Additional resources provided today: None  Recommended Physical Activity Goals  Amanda Dixon has been advised to work up to 150 minutes of moderate intensity aerobic activity a week and strengthening exercises 2-3 times per week for cardiovascular health, weight loss maintenance and preservation of muscle mass.   She has agreed to :  Think about ways to increase daily physical activity and overcoming barriers to exercise  Pharmacotherapy We discussed various medication options to help Amanda Dixon with  her weight loss efforts and we both agreed to : increase Wegovy 0.5 mg once a week  ASSOCIATED CONDITIONS ADDRESSED TODAY  Prediabetes  Morbid obesity: Starting BMI 41.1 -     Semaglutide-Weight Management; Inject 0.5  mg into the skin once a week.  Dispense: 2 mL; Refill: 0  Hypertension, essential Assessment & Plan: Blood pressure at goal for age and risk category.  On losartan 25 mg daily without adverse effects.  Most recent renal parameters reviewed which showed normal electrolytes and kidney function.  Continue with weight loss therapy.  Monitor for symptoms of orthostasis while losing weight. Continue current regimen and home monitoring for a goal blood pressure of 120/80.    Abnormal craving Assessment & Plan: Neurohormonal and psychological.  Has been working with Dr. Dewaine Dixon for cognitive behavioral therapy.  We also counseled patient today on cognitive restructuring I will like for her to start considering the risk of consumption of simple sugars, reading the labels when consuming these beverages.  She will also benefit from tracking and journaling calories.  She does acknowledge that this is a source of calories.  Increasing Amanda Dixon will help.  She will be working on reducing frequency and amount of sugary drinks.     PHYSICAL EXAM:  Blood pressure 123/75, pulse 89, temperature 97.9 F (36.6 C), height 5\' 5"  (1.651 m), weight 235 lb (106.6 kg), SpO2 97 %. Body mass index is 39.11 kg/m.  General: She is overweight, cooperative, alert, well developed, and in no acute distress. PSYCH: Has normal mood, affect and thought process.   HEENT: EOMI, sclerae are anicteric. Lungs: Normal breathing effort, no conversational dyspnea. Extremities: No edema.  Neurologic: No gross sensory or motor deficits. No tremors or fasciculations noted.    DIAGNOSTIC DATA REVIEWED:  BMET    Component Value Date/Time   NA 140 05/28/2022 1416   K 4.6 05/28/2022 1416   CL 100 05/28/2022 1416   CO2 21 05/28/2022 1416   GLUCOSE 91 05/28/2022 1416   GLUCOSE 91 08/21/2021 0832   BUN 13 05/28/2022 1416   CREATININE 0.68 05/28/2022 1416   CALCIUM 9.4 05/28/2022 1416   GFRNONAA >60 08/06/2021 1746   GFRAA 100  01/27/2019 1204   Lab Results  Component Value Date   HGBA1C 5.9 (H) 05/28/2022   HGBA1C 5.5 01/27/2019   Lab Results  Component Value Date   INSULIN 24.3 05/14/2022   Lab Results  Component Value Date   TSH 2.340 05/28/2022   CBC    Component Value Date/Time   WBC 14.5 (H) 05/28/2022 1416   WBC 11.1 (H) 08/06/2021 1746   RBC 4.50 05/28/2022 1416   RBC 4.42 08/06/2021 1746   HGB 13.4 05/28/2022 1416   HCT 40.1 05/28/2022 1416   PLT 321 08/06/2021 1746   MCV 89 05/28/2022 1416   MCH 29.8 05/28/2022 1416   MCH 30.8 08/06/2021 1746   MCHC 33.4 05/28/2022 1416   MCHC 33.1 08/06/2021 1746   RDW 13.1 05/28/2022 1416   Iron Studies No results found for: "IRON", "TIBC", "FERRITIN", "IRONPCTSAT" Lipid Panel     Component Value Date/Time   CHOL 174 05/14/2022 1103   TRIG 151 (H) 05/14/2022 1103   HDL 48 05/14/2022 1103   CHOLHDL 3 07/10/2021 0850   VLDL 19.4 07/10/2021 0850   LDLCALC 100 (H) 05/14/2022 1103   Hepatic Function Panel     Component Value Date/Time   PROT 7.4 05/28/2022 1416   ALBUMIN 4.4 05/28/2022 1416   AST 21  05/28/2022 1416   ALT 13 05/28/2022 1416   ALKPHOS 73 05/28/2022 1416   BILITOT 0.2 05/28/2022 1416      Component Value Date/Time   TSH 2.340 05/28/2022 1416   Nutritional Lab Results  Component Value Date   VD25OH 27.1 (L) 05/28/2022     Return in about 3 weeks (around 12/26/2022) for For Weight Mangement with Dr. Rikki Spearing.Marland Kitchen She was informed of the importance of frequent follow up visits to maximize her success with intensive lifestyle modifications for her multiple health conditions.   ATTESTASTION STATEMENTS:  Reviewed by clinician on day of visit: allergies, medications, problem list, medical history, surgical history, family history, social history, and previous encounter notes.     Worthy Rancher, MD

## 2022-12-05 NOTE — Assessment & Plan Note (Signed)
Neurohormonal and psychological.  Has been working with Dr. Dewaine Conger for cognitive behavioral therapy.  We also counseled patient today on cognitive restructuring I will like for her to start considering the risk of consumption of simple sugars, reading the labels when consuming these beverages.  She will also benefit from tracking and journaling calories.  She does acknowledge that this is a source of calories.  Increasing [name] will help.  She will be working on reducing frequency and amount of sugary drinks.

## 2022-12-16 ENCOUNTER — Telehealth (INDEPENDENT_AMBULATORY_CARE_PROVIDER_SITE_OTHER): Payer: 59 | Admitting: Psychology

## 2022-12-16 DIAGNOSIS — F419 Anxiety disorder, unspecified: Secondary | ICD-10-CM

## 2022-12-16 DIAGNOSIS — F5089 Other specified eating disorder: Secondary | ICD-10-CM | POA: Diagnosis not present

## 2022-12-16 DIAGNOSIS — F32A Depression, unspecified: Secondary | ICD-10-CM | POA: Diagnosis not present

## 2022-12-16 NOTE — Progress Notes (Signed)
  Office: [phone]  /  Fax: [phone]    Date: Dec 16, 2022    Appointment Start Time: 11:34am Duration: 24 minutes Provider: Lawerance Cruel, Psy.D. Type of Session: Individual Therapy  Location of Patient: Work (private location) Location of Provider: [employer] (private office) Type of Contact: Telepsychological Visit via MyChart Video Visit  Session Content:This provider called Amanda Dixon at 11:32am as she did not present for today's appointment. She stated a meeting ran late, but she will join the appointment. As such, today's appointment was initiated 4 minutes late. Amanda Dixon is a 57 y.o. female presenting for a follow-up appointment to address the previously established treatment goal of increasing coping skills.Today's appointment was a telepsychological visit. Amanda Dixon provided verbal consent for today's telepsychological appointment and she is aware she is responsible for securing confidentiality on her end of the session. Prior to proceeding with today's appointment, Amanda Dixon's physical location at the time of this appointment was obtained as well a phone number she could be reached at in the event of technical difficulties. Amanda Dixon and this provider participated in today's telepsychological service.   This provider conducted a brief check-in. Amanda Dixon reported experiencing "craziness." Regarding eating habits, she noted an improvement. Regarding the previously established SMART goal, she noted meeting her goal. Associated thoughts and feelings processed. Positive reinforcement was provided. She discussed awareness of "what to do" but discussed ongoing stressors are getting in the way. Further explored and processed. This provider again recommended traditional therapeutic services. She acknowledged the importance of initiating therapeutic services and discussed reaching out to providers. An additional referral option was shared [facility]). Due to ongoing stressors, reviewed the  importance of self-care. Furthermore, termination planning was discussed. Amanda Dixon was receptive to a follow-up appointment in 3-4 weeks and an additional follow-up/termination appointment in 3-4 weeks after that. Overall, Amanda Dixon was receptive to today's appointment as evidenced by openness to sharing, responsiveness to feedback, and willingness to implement discussed strategies .  Mental Status Examination:  Appearance: neat Behavior: appropriate to circumstances Mood: neutral Affect: mood congruent Speech: WNL Eye Contact: appropriate Psychomotor Activity: WNL Gait: unable to assess Thought Process: linear, logical, and goal directed and no evidence or endorsement of suicidal, homicidal, and self-harm ideation, plan and intent  Thought Content/Perception: no hallucinations, delusions, bizarre thinking or behavior endorsed or observed Orientation: AAOx4 Memory/Concentration: intact Insight: fair Judgment: fair  Interventions:  Conducted a brief chart review Provided empathic reflections and validation Reviewed content from the previous session Provided positive reinforcement Employed supportive psychotherapy interventions to facilitate reduced distress and to improve coping skills with identified stressors Recommended/discussed options for longer-term therapeutic services  DSM-5 Diagnosis(es):  F50.89 Other Specified Feeding or Eating Disorder, Emotional Eating Behaviors, F41.9 Unspecified Anxiety Disorder, and  F32.A Unspecified Depressive Disorder  Treatment Goal & Progress: During the initial appointment with this provider, the following treatment goal was established: increase coping skills. Amanda Dixon has demonstrated progress in her goal as evidenced by increased awareness of hunger patterns, increased awareness of triggers for emotional eating behaviors, and reduction in emotional eating behaviors . Amanda Dixon also continues to demonstrate willingness to engage in learned skill(s).  Plan: The  next appointment is scheduled for 01/14/2023 at 8am, which will be via MyChart Video Visit. The next session will focus on working towards the established treatment goal. Amanda Dixon will contact [employer] [facility].

## 2022-12-25 NOTE — Progress Notes (Unsigned)
TeleHealth Visit:  This visit was completed with telemedicine (audio/video) technology. Amanda Dixon has verbally consented to this TeleHealth visit. The patient is located at home, the provider is located at home. The participants in this visit include the listed provider and patient. The visit was conducted today via MyChart video.  OBESITY Amanda Dixon is here to discuss her progress with her obesity treatment plan along with follow-up of her obesity related diagnoses.   Today's visit was # 9 Starting weight: 247 lbs Starting date: 05/14/22 Weight at last in office visit: 235 lbs on 12/05/22 Total weight loss: 12 lbs at last in office visit on 12/05/22. Today's reported weight (12/26/22):  230 lbs  Nutrition Plan: the Category 3 plan   Current exercise:  none  Interim History:  Has two 12 oz bottles of coke per day. She is trying to reduce this. She used to drink much more. She is down 5 lbs since last OV according to home scale. She is mostly getting in her protein. Feels more tired when she does not get it in. Has a lot of family stress. Has been seeing Dr. Dewaine Conger and has started seeing an outside counselor.  Water intake is adequate (at least 64-96 oz per day).  Unable to exercise to to back issues.   Pharmacotherapy: Amanda Dixon is on Wegovy 0.50 mg SQ weekly Adverse side effects: None Hunger is well controlled.  Cravings are well controlled.  Assessment/Plan:  1. Eating disorder/emotional eating Denies cravings presently. Seeing Dr. Dewaine Conger and recently started seeing an outside counselor.  Currently this is well controlled. Overall mood is stable. Medication(s): Paxil and BuSpar  Plan: FU with counselors as directed. Continue Paxil and BuSpar at current doses.    2. Vitamin D Deficiency Vitamin D is not at goal of 50.  Most recent vitamin D level was 27.1 on 05/29/23. She is on  prescription ergocalciferol 50,000 IU weekly. Lab Results  Component Value Date   VD25OH 27.1 (L)  05/28/2022    Plan: Continue and refill  prescription ergocalciferol 50,000 IU weekly   3. Morbid Obesity: Current BMI 39  Pharmacotherapy Plan Continue and increase dose  Wegovy 1.0 mg SQ weekly Finish last 2 injections of 0.5 mg of Wegovy.  Amanda Dixon is currently in the action stage of change. As such, her goal is to continue with weight loss efforts.  She has agreed to the Category 3 plan.  Continue to work on reducing Coke intake.  Exercise goals: Encouraged her to start using her resistance bands a few days per week.  Behavioral modification strategies: increasing lean protein intake, decreasing simple carbohydrates , decrease liquid calories, and planning for success.  Amanda Dixon has agreed to follow-up with our clinic in 3 weeks.  No orders of the defined types were placed in this encounter.   Medications Discontinued During This Encounter  Medication Reason   Semaglutide-Weight Management 0.5 MG/0.5ML SOAJ Dose change   Vitamin D, Ergocalciferol, (DRISDOL) 1.25 MG (50000 UNIT) CAPS capsule Reorder     Meds ordered this encounter  Medications   Semaglutide-Weight Management (WEGOVY) 1 MG/0.5ML SOAJ    Sig: Inject 1 mg into the skin once a week.    Dispense:  2 mL    Refill:  0    Order Specific Question:   Supervising Provider    Answer:   Carolin Sicks   Vitamin D, Ergocalciferol, (DRISDOL) 1.25 MG (50000 UNIT) CAPS capsule    Sig: Take 1 capsule (50,000 Units total) by mouth every 7 (  seven) days.    Dispense:  13 capsule    Refill:  0    Order Specific Question:   Supervising Provider    Answer:   Glennis Brink [[id number]]      Objective:   VITALS: Per patient if applicable, see vitals. GENERAL: Alert and in no acute distress. CARDIOPULMONARY: No increased WOB. Speaking in clear sentences.  PSYCH: Pleasant and cooperative. Speech normal rate and rhythm. Affect is appropriate. Insight and judgement are appropriate. Attention is focused, linear, and appropriate.   NEURO: Oriented as arrived to appointment on time with no prompting.   Attestation Statements:   Reviewed by clinician on day of visit: allergies, medications, problem list, medical history, surgical history, family history, social history, and previous encounter notes.   This was prepared with the assistance of Engineer, civil (consulting).  Occasional wrong-word or sound-a-like substitutions may have occurred due to the inherent limitations of voice recognition software.

## 2022-12-26 ENCOUNTER — Telehealth (INDEPENDENT_AMBULATORY_CARE_PROVIDER_SITE_OTHER): Payer: 59 | Admitting: Family Medicine

## 2022-12-26 ENCOUNTER — Encounter (INDEPENDENT_AMBULATORY_CARE_PROVIDER_SITE_OTHER): Payer: Self-pay | Admitting: Family Medicine

## 2022-12-26 ENCOUNTER — Other Ambulatory Visit (HOSPITAL_COMMUNITY): Payer: Self-pay

## 2022-12-26 DIAGNOSIS — E559 Vitamin D deficiency, unspecified: Secondary | ICD-10-CM | POA: Diagnosis not present

## 2022-12-26 DIAGNOSIS — Z6839 Body mass index (BMI) 39.0-39.9, adult: Secondary | ICD-10-CM

## 2022-12-26 DIAGNOSIS — F5089 Other specified eating disorder: Secondary | ICD-10-CM

## 2022-12-26 MED ORDER — WEGOVY 1 MG/0.5ML ~~LOC~~ SOAJ
1.0000 mg | SUBCUTANEOUS | 0 refills | Status: DC
  Filled 2022-12-26: qty 2, 28d supply, fill #0

## 2022-12-26 MED ORDER — VITAMIN D (ERGOCALCIFEROL) 1.25 MG (50000 UNIT) PO CAPS
50000.0000 [IU] | ORAL_CAPSULE | ORAL | 0 refills | Status: DC
  Filled 2022-12-26: qty 4, 28d supply, fill #0

## 2023-01-10 ENCOUNTER — Other Ambulatory Visit: Payer: Self-pay | Admitting: Primary Care

## 2023-01-10 DIAGNOSIS — J45990 Exercise induced bronchospasm: Secondary | ICD-10-CM

## 2023-01-14 ENCOUNTER — Telehealth (INDEPENDENT_AMBULATORY_CARE_PROVIDER_SITE_OTHER): Payer: 59 | Admitting: Psychology

## 2023-01-14 DIAGNOSIS — F5089 Other specified eating disorder: Secondary | ICD-10-CM

## 2023-01-14 DIAGNOSIS — F419 Anxiety disorder, unspecified: Secondary | ICD-10-CM

## 2023-01-14 DIAGNOSIS — F32A Depression, unspecified: Secondary | ICD-10-CM

## 2023-01-14 NOTE — Progress Notes (Signed)
  Office: [phone]  /  Fax: [phone]    Date: January 14, 2023  Appointment Start Time: 8:01am Duration: 27 minutes Provider: Lawerance Cruel, Psy.D. Type of Session: Individual Therapy  Location of Patient: Home (private location) Location of Provider: Provider's Home (private office) Type of Contact: Telepsychological Visit via MyChart Video Visit  Session Content: Amanda Dixon is a 57 y.o. female presenting for a follow-up appointment to address the previously established treatment goal of increasing coping skills.Today's appointment was a telepsychological visit. [name] provided verbal consent for today's telepsychological appointment and she is aware she is responsible for securing confidentiality on her end of the session. Prior to proceeding with today's appointment, [name]'s physical location at the time of this appointment was obtained as well a phone number she could be reached at in the event of technical difficulties. [name] and this provider participated in today's telepsychological service.   This provider conducted a brief check-in. [name] reported a continued reduction in soda intake, adding weight loss has been limited due to recent beach trips. Further explored and processed. She was engaged in problem solving to ensure she has access to foods that are congruent to her goals, as it was reflected she is likely not consuming enough protein. She agreed to keep protein snacks easily accessible (e.g., cheese, jerky, yogurt, [place] roll ups, tuna packets, cucumbers and humus). Overall, [name] was receptive to today's appointment as evidenced by openness to sharing, responsiveness to feedback, and willingness to implement discussed strategies .  Mental Status Examination:  Appearance: neat Behavior: appropriate to circumstances Mood: neutral Affect: mood congruent Speech: WNL Eye Contact: appropriate Psychomotor Activity: WNL Gait: unable to assess Thought Process: linear, logical, and goal  directed and no evidence or endorsement of suicidal, homicidal, and self-harm ideation, plan and intent  Thought Content/Perception: no hallucinations, delusions, bizarre thinking or behavior endorsed or observed Orientation: AAOx4 Memory/Concentration: memory, attention, language, and fund of knowledge intact  Insight: fair Judgment: fair  Interventions:  Conducted a brief chart review Provided empathic reflections and validation Employed supportive psychotherapy interventions to facilitate reduced distress and to improve coping skills with identified stressors Engaged patient in problem solving  DSM-5 Diagnosis(es):  F50.89 Other Specified Feeding or Eating Disorder, Emotional Eating Behaviors, F41.9 Unspecified Anxiety Disorder, and  F32.A Unspecified Depressive Disorder  Treatment Goal & Progress: During the initial appointment with this provider, the following treatment goal was established: increase coping skills. [name] has demonstrated progress in her goal as evidenced by increased awareness of hunger patterns, increased awareness of triggers for emotional eating behaviors, and reduction in emotional eating behaviors . [name] also continues to demonstrate willingness to engage in learned skill(s).  Plan: The next appointment is scheduled for 02/11/2023 at 8am, which will be via MyChart Video Visit. The next session will focus on working towards the established treatment goal and termination. [name] will continue with her primary therapist ([facility]).

## 2023-01-15 ENCOUNTER — Ambulatory Visit (INDEPENDENT_AMBULATORY_CARE_PROVIDER_SITE_OTHER): Payer: 59 | Admitting: Internal Medicine

## 2023-01-20 ENCOUNTER — Telehealth (INDEPENDENT_AMBULATORY_CARE_PROVIDER_SITE_OTHER): Payer: 59 | Admitting: Family Medicine

## 2023-01-20 ENCOUNTER — Telehealth (INDEPENDENT_AMBULATORY_CARE_PROVIDER_SITE_OTHER): Payer: Self-pay | Admitting: Family Medicine

## 2023-01-20 NOTE — Telephone Encounter (Signed)
Patient was a no-show for virtual visit.  Waited on the virtual visit until 6 minutes past appointment time per office policy.  Left VM advising patient to call to reschedule.  

## 2023-01-20 NOTE — Progress Notes (Deleted)
  TeleHealth Visit:  This visit was completed with telemedicine (audio/video) technology. Amanda Dixon has verbally consented to this TeleHealth visit. The patient is located at home, the provider is located at home. The participants in this visit include the listed provider and patient. The visit was conducted today via MyChart video.  OBESITY Amanda Dixon is here to discuss her progress with her obesity treatment plan along with follow-up of her obesity related diagnoses.   Today's visit was # 10 tarting weight: 247 lbs Starting date: 05/14/22 Weight at last in office visit: 235 lbs on 12/05/22 Total weight loss: 12 lbs at last in office visit on 12/05/22. Today's reported weight (01/20/23): {dwwweightreported:29243}  Nutrition Plan: the Category 3 plan - ***% adherence.  Current exercise: {exercise types:16438} none  Interim History:  ***  Eating all of the prescribed protein: {yes***/no:17258} Skipping meals: {dwwyes:29172} Drinking adequate water: {dwwyes:29172} Drinking sugar sweetened beverages: {dwwyes:29172} Hunger controlled: {EWCONTROLASSESSMENT:24261}. Cravings controlled:  {EWCONTROLASSESSMENT:24261}.  Journaling Consistently:  {dwwyes:29172} Meeting protein goals:  {dwwyes:29172} Meeting calorie goals:  {dwwyes:29172}   Pharmacotherapy: Amanda Dixon is on {dwwpharmacotherapy:29109} Adverse side effects: {dwwse:29122} Hunger is {EWCONTROLASSESSMENT:24261}.  Cravings are {EWCONTROLASSESSMENT:24261}.  Assessment/Plan:  1. ***  2. ***  3. ***  {dwwmorbid:29108::"Morbid Obesity"}: Current BMI ***  Pharmacotherapy Plan {dwwmed:29123}  {dwwpharmacotherapy:29109}  Amanda Dixon {CHL AMB IS/IS NOT:210130109} currently in the action stage of change. As such, her goal is to {MWMwtloss#1:210800005}.  She has agreed to {dwwsldiets:29085}.  Exercise goals: {MWM EXERCISE RECS:23473}  Behavioral modification strategies: {dwwslwtlossstrategies:29088}.  Amanda Dixon has agreed to follow-up with our  clinic in {NUMBER 1-10:22536} {dwwfutime:29619}  No orders of the defined types were placed in this encounter.   There are no discontinued medications.   No orders of the defined types were placed in this encounter.     Objective:   VITALS: Per patient if applicable, see vitals. GENERAL: Alert and in no acute distress. CARDIOPULMONARY: No increased WOB. Speaking in clear sentences.  PSYCH: Pleasant and cooperative. Speech normal rate and rhythm. Affect is appropriate. Insight and judgement are appropriate. Attention is focused, linear, and appropriate.  NEURO: Oriented as arrived to appointment on time with no prompting.   Attestation Statements:   Reviewed by clinician on day of visit: allergies, medications, problem list, medical history, surgical history, family history, social history, and previous encounter notes.  ***(delete if time-based billing not used) Time spent on visit including the items listed below was *** minutes.  -preparing to see the patient (e.g., review of tests, history, previous notes) -obtaining and/or reviewing separately obtained history -counseling and educating the patient/family/caregiver -documenting clinical information in the electronic or other health record -ordering medications, tests, or procedures -independently interpreting results and communicating results to the patient/ family/caregiver -referring and communicating with other health care professionals  -care coordination   This was prepared with the assistance of Engineer, civil (consulting).  Occasional wrong-word or sound-a-like substitutions may have occurred due to the inherent limitations of voice recognition software.

## 2023-02-11 ENCOUNTER — Telehealth (INDEPENDENT_AMBULATORY_CARE_PROVIDER_SITE_OTHER): Payer: 59 | Admitting: Psychology

## 2023-02-11 DIAGNOSIS — F5089 Other specified eating disorder: Secondary | ICD-10-CM

## 2023-02-11 DIAGNOSIS — F419 Anxiety disorder, unspecified: Secondary | ICD-10-CM

## 2023-02-11 DIAGNOSIS — F32A Depression, unspecified: Secondary | ICD-10-CM

## 2023-02-11 NOTE — Progress Notes (Signed)
  Office: [phone]  /  Fax: [phone]    Date: February 11, 2023  Appointment Start Time: 8:00am Duration: 23 minutes Provider: Lawerance Cruel, Psy.D. Type of Session: Individual Therapy  Location of Patient: Home (private location) Location of Provider: Provider's Home (private office) Type of Contact: Telepsychological Visit via MyChart Video Visit  Session Content: Brexlee is a 57 y.o. female presenting for a follow-up appointment to address the previously established treatment goal of increasing coping skills.Today's appointment was a telepsychological visit. Meeghan provided verbal consent for today's telepsychological appointment and she is aware she is responsible for securing confidentiality on her end of the session. Prior to proceeding with today's appointment, Krysti's physical location at the time of this appointment was obtained as well a phone number she could be reached at in the event of technical difficulties. Ghina and this provider participated in today's telepsychological service.   This provider conducted a brief check-in. Delsa reported continued issues with [name], noting a plan to discontinue it after discussing with her prescribing provider. Despite the aforementioned, she discussed she continues to lose weight and engage in physical activity. Additionally, Ilene continues to report a reduction in emotional eating behaviors. A plan was developed to help Chandrika cope with emotional eating behaviors in the future using learned skills. She wrote down the following plan: focus on hydration; be prepared with snacks congruent to the meal plan; pause to ask questions when triggered to eat (e.g., Am I really hungry?, Is there something bothering me?, and Will I feel better if I eat?); and engage in discussed coping strategies after going through the aforementioned questions. Overall, Julita was receptive to today's appointment as evidenced by openness to sharing, responsiveness to feedback, and  willingness to continue engaging in learned skills.  Mental Status Examination:  Appearance: neat Behavior: appropriate to circumstances Mood: neutral Affect: mood congruent Speech: WNL Eye Contact: appropriate Psychomotor Activity: WNL Gait: unable to assess Thought Process: linear, logical, and goal directed and no evidence or endorsement of suicidal, homicidal, and self-harm ideation, plan and intent  Thought Content/Perception: no hallucinations, delusions, bizarre thinking or behavior endorsed or observed Orientation: AAOx4 Memory/Concentration: memory, attention, language, and fund of knowledge intact  Insight: good Judgment: good  Interventions:  Conducted a brief chart review Provided empathic reflections and validation Provided positive reinforcement Employed supportive psychotherapy interventions to facilitate reduced distress and to improve coping skills with identified stressors Reviewed learned skills  DSM-5 Diagnosis(es):  F50.89 Other Specified Feeding or Eating Disorder, Emotional Eating Behaviors, F41.9 Unspecified Anxiety Disorder, and  F32.A Unspecified Depressive Disorder  Treatment Goal & Progress: During the initial appointment with this provider, the following treatment goal was established: increase coping skills. Maisy demonstrated progress in her goal as evidenced by increased awareness of hunger patterns, increased awareness of triggers for emotional eating behaviors, and reduction in emotional eating behaviors . Patricie also continues to demonstrate willingness to engage in learned skill(s).  Plan: As previously planned, today was Shashana's last appointment with this provider. She acknowledged understanding that she may request a follow-up appointment with this provider in the future as long as she is still established with the clinic. She will continue with her primary therapist. No further follow-up planned by this provider.

## 2023-02-14 ENCOUNTER — Other Ambulatory Visit: Payer: Self-pay | Admitting: Primary Care

## 2023-02-14 DIAGNOSIS — J45990 Exercise induced bronchospasm: Secondary | ICD-10-CM

## 2023-02-14 DIAGNOSIS — F411 Generalized anxiety disorder: Secondary | ICD-10-CM

## 2023-03-27 ENCOUNTER — Other Ambulatory Visit: Payer: Self-pay | Admitting: Primary Care

## 2023-03-27 DIAGNOSIS — F411 Generalized anxiety disorder: Secondary | ICD-10-CM

## 2023-04-17 ENCOUNTER — Other Ambulatory Visit: Payer: Self-pay | Admitting: Primary Care

## 2023-04-17 DIAGNOSIS — F411 Generalized anxiety disorder: Secondary | ICD-10-CM

## 2023-04-21 ENCOUNTER — Other Ambulatory Visit: Payer: Self-pay | Admitting: *Deleted

## 2023-04-21 ENCOUNTER — Emergency Department
Admission: EM | Admit: 2023-04-21 | Discharge: 2023-04-22 | Disposition: A | Discharge status: Home / Self Care | Payer: 59 | Attending: Emergency Medicine | Admitting: Emergency Medicine

## 2023-04-21 DIAGNOSIS — R519 Headache, unspecified: Secondary | ICD-10-CM | POA: Diagnosis present

## 2023-04-21 DIAGNOSIS — I10 Essential (primary) hypertension: Secondary | ICD-10-CM | POA: Diagnosis not present

## 2023-04-21 DIAGNOSIS — R0789 Other chest pain: Secondary | ICD-10-CM | POA: Insufficient documentation

## 2023-04-21 DIAGNOSIS — F411 Generalized anxiety disorder: Secondary | ICD-10-CM

## 2023-04-21 DIAGNOSIS — G43809 Other migraine, not intractable, without status migrainosus: Secondary | ICD-10-CM | POA: Insufficient documentation

## 2023-04-21 DIAGNOSIS — Z20822 Contact with and (suspected) exposure to covid-19: Secondary | ICD-10-CM | POA: Diagnosis not present

## 2023-04-21 LAB — BASIC METABOLIC PANEL
Anion gap: 12 (ref 5–15)
BUN: 15 mg/dL (ref 6–20)
CO2: 28 mmol/L (ref 22–32)
Calcium: 9.3 mg/dL (ref 8.9–10.3)
Chloride: 100 mmol/L (ref 98–111)
Creatinine, Ser: 0.76 mg/dL (ref 0.44–1.00)
GFR, Estimated: >60 mL/min (ref >60)
Glucose, Bld: 113 mg/dL — ABNORMAL HIGH (ref 70–99)
Potassium: 3.3 mmol/L — ABNORMAL LOW (ref 3.5–5.1)
Sodium: 140 mmol/L (ref 135–145)

## 2023-04-21 LAB — CBC
HCT: 45.2 % (ref 36.0–46.0)
Hemoglobin: 14.7 g/dL (ref 12.0–15.0)
MCH: 29.9 pg (ref 26.0–34.0)
MCHC: 32.5 g/dL (ref 30.0–36.0)
MCV: 92.1 fL (ref 80.0–100.0)
Platelets: 381 10*3/uL (ref 150–400)
RBC: 4.91 MIL/uL (ref 3.87–5.11)
RDW: 14.6 % (ref 11.5–15.5)
WBC: 14.1 10*3/uL — ABNORMAL HIGH (ref 4.0–10.5)
nRBC: 0 % (ref 0.0–0.2)

## 2023-04-21 LAB — TROPONIN I (HIGH SENSITIVITY): Troponin I (High Sensitivity): 2 ng/L (ref <18)

## 2023-04-21 MED ORDER — PAROXETINE HCL 20 MG PO TABS: 20.0000 mg | ORAL_TABLET | Freq: Every day | ORAL | 0 refills | Status: DC

## 2023-04-21 NOTE — ED Notes (Signed)
Pt wheeled to rm via wc. Mandie, RN made aware of pt being in rm. Husband at bedside. Call light within reach.

## 2023-04-21 NOTE — ED Triage Notes (Addendum)
Pt POV for headache in the back, radiates to left shoulder, worse than migraines, started yesterday and has progressed in pain. Has had nausea since yesterday. Migraine meds have not helped. States she's had blurry vision and roaring in the ears while driving, felt like she was going to pass out so went to the fire department. Fire unable to get BP and apparently told her she having slurred speech. Pt does not currently have slurred speech. Denies fevers.

## 2023-04-21 NOTE — ED Notes (Addendum)
Pt ambulatory to STAT desk to inquire over wait time; updated on such; pt expresses concerns due to her HA and CP over COVID testing and also possible carbon monixide exposure due to her gas water heater that was used over the weekend; add'l orders added

## 2023-04-22 ENCOUNTER — Emergency Department: Payer: 59

## 2023-04-22 LAB — TROPONIN I (HIGH SENSITIVITY): Troponin I (High Sensitivity): <2 ng/L (ref <18)

## 2023-04-22 LAB — COOXEMETRY PANEL
Carboxyhemoglobin: 0.5 % (ref 0.5–1.5)
Methemoglobin: 0.7 % (ref 0.0–1.5)
O2 Saturation: 38.8 %
Total hemoglobin: 15.8 g/dL (ref 12.0–16.0)
Total oxygen content: 38.3 %

## 2023-04-22 LAB — RESP PANEL BY RT-PCR (RSV, FLU A&B, COVID)  RVPGX2
Influenza A by PCR: NEGATIVE
Influenza B by PCR: NEGATIVE
Resp Syncytial Virus by PCR: NEGATIVE
SARS Coronavirus 2 by RT PCR: NEGATIVE

## 2023-04-22 MED ORDER — ACETAMINOPHEN 500 MG PO TABS
1000.0000 mg | ORAL_TABLET | Freq: Once | ORAL | Status: AC
  Administered 2023-04-22: 1000 mg via ORAL
  Filled 2023-04-22: qty 2

## 2023-04-22 MED ORDER — ONDANSETRON 4 MG PO TBDP
4.0000 mg | ORAL_TABLET | Freq: Once | ORAL | Status: AC
  Administered 2023-04-22: 4 mg via ORAL
  Filled 2023-04-22: qty 1

## 2023-04-22 MED ORDER — ONDANSETRON HCL 4 MG PO TABS: 4.0000 mg | ORAL_TABLET | Freq: Every day | ORAL | 1 refills | Status: DC | PRN

## 2023-04-22 MED ORDER — IBUPROFEN 600 MG PO TABS
600.0000 mg | ORAL_TABLET | Freq: Once | ORAL | Status: AC
  Administered 2023-04-22: 600 mg via ORAL
  Filled 2023-04-22: qty 1

## 2023-04-22 NOTE — ED Provider Notes (Signed)
[facility] Provider Note    Event Date/Time   First MD Initiated Contact with Patient 04/21/23 2332     (approximate)   History   Migraine (/) and Chest Pain   HPI  Amanda Dixon is a 57 y.o. female   Past medical history of hypertension, anxiety and depression, migraines, who presents to the emergency department with migraine headache.  Developed while coming home from vacation this weekend, unilateral pain to the occiput of the head and neck typical of her migraines.  She did not have her migraine medication so it progressively worsened.  She had some transient chest pain without shortness of breath that is now resolved as well.  Her husband is at bedside states that he has a mild headache and wonders if there is a carbon monoxide problem with the gas heater at the vacation [place] the beach.  She took some Aleve this morning her headache feels better already.  She has no remaining chest pain.  Independent Historian contributed to assessment above: Husband is at bedside corroborates information given above    Physical Exam   Triage Vital Signs: ED Triage Vitals  Encounter Vitals Group     BP 04/21/23 1840 (!) 154/94     Systolic BP Percentile --      Diastolic BP Percentile --      Pulse Rate 04/21/23 1840 84     Resp 04/21/23 1840 20     Temp 04/21/23 1840 98.7 F (37.1 C)     Temp src --      SpO2 04/21/23 1840 98 %     Weight 04/21/23 1837 230 lb (104.3 kg)     Height 04/21/23 1837 5\' 5"  (1.651 m)     Head Circumference --      Peak Flow --      Pain Score 04/21/23 1837 10     Pain Loc --      Pain Education --      Exclude from Growth Chart --     Most recent vital signs: Vitals:   04/22/23 0100 04/22/23 0133  BP: 126/68   Pulse: 71   Resp: (!) 21   Temp:  97.7 F (36.5 C)  SpO2: 100%     General: Awake, no distress.  CV:  Good peripheral perfusion.  Resp:  Normal effort.  Abd:  No distention.  Other:  Pleasant woman  in no acute distress with normal vital signs, neck is supple with full range of motion, moving all extremities with full active range of motion, clear lungs, soft nontender abdomen and appears euvolemic.   ED Results / Procedures / Treatments   Labs (all labs ordered are listed, but only abnormal results are displayed) Labs Reviewed  BASIC METABOLIC PANEL - Abnormal; Notable for the following components:      Result Value   Potassium 3.3 (*)    Glucose, Bld 113 (*)    All other components within normal limits  CBC - Abnormal; Notable for the following components:   WBC 14.1 (*)    All other components within normal limits  RESP PANEL BY RT-PCR (RSV, FLU A&B, COVID)  RVPGX2  COOXEMETRY PANEL  TROPONIN I (HIGH SENSITIVITY)  TROPONIN I (HIGH SENSITIVITY)     I ordered and reviewed the above labs they are notable for white blood cell count is mildly elevated at 14.1 and her carboxyhemoglobin is normal.  Troponin x 2 normal.  EKG  ED ECG REPORT  Ross Marcus, the attending physician, personally viewed and interpreted this ECG.   Date: 04/22/2023  EKG Time: 1843  Rate: 82  Rhythm: sinus  Axis: nl  Intervals: none  ST&T Change: no stemi    RADIOLOGY I independently reviewed and interpreted chest x-ray and I see no obvious focalities or pneumothorax I also reviewed radiologist's formal read.   PROCEDURES:  Critical Care performed: No  Procedures   MEDICATIONS ORDERED IN ED: Medications  acetaminophen (TYLENOL) tablet 1,000 mg (1,000 mg Oral Given 04/22/23 0116)  ibuprofen (ADVIL) tablet 600 mg (600 mg Oral Given 04/22/23 0116)  ondansetron (ZOFRAN-ODT) disintegrating tablet 4 mg (4 mg Oral Given 04/22/23 0116)   IMPRESSION / MDM / ASSESSMENT AND PLAN / ED COURSE  I reviewed the triage vital signs and the nursing notes.                                Patient's presentation is most consistent with acute presentation with potential threat to life or bodily  function.  Differential diagnosis includes, but is not limited to, migraine headache, ICH or stroke, ACS, PE, dissection, carbon monoxide poisoning   The patient is on the cardiac monitor to evaluate for evidence of arrhythmia and/or significant heart rate changes.  MDM:    This is a patient with chief complaint of her typical migraine without her medications progressively worsened.  Already better with some Aleve and rest.  No trauma, no thinners, no focal deficits to suggest ICH or CVA.  Transient chest pain doubt ACS within normal EKG and especially in the light of normal troponin x 2.  Treated headache with medications oral in the emergency department.  Check carbon oxide testing which is normal.  Given normal workup as above, much improvement in symptoms plan will be for discharge and PMD follow-up.       FINAL CLINICAL IMPRESSION(S) / ED DIAGNOSES   Final diagnoses:  Other migraine without status migrainosus, not intractable  Atypical chest pain     Rx / DC Orders   ED Discharge Orders          Ordered    ondansetron (ZOFRAN) 4 MG tablet  Daily PRN        04/22/23 0122             Note:  This document was prepared using Dragon voice recognition software and may include unintentional dictation errors.    Pilar Jarvis, MD 04/22/23 [phone]

## 2023-04-22 NOTE — Discharge Instructions (Signed)
Fortunately your testing in the emergency department did not show any emergency conditions like carbon monoxide poisoning, heart attack, and your symptoms do not match with things like bleeding inside of the head or stroke.  Take acetaminophen 650 mg and ibuprofen 400 mg every 6 hours for pain.  Take with food.  Thank you for choosing [place] for your health care today!  Please see your primary doctor this week for a follow up appointment.   If you have any new, worsening, or unexpected symptoms call your doctor right away or come back to the emergency department for reevaluation.  It was my pleasure to care for you today.   Daneil Dan Modesto Charon, MD

## 2023-05-28 ENCOUNTER — Other Ambulatory Visit: Payer: Self-pay | Admitting: Primary Care

## 2023-05-28 DIAGNOSIS — J45990 Exercise induced bronchospasm: Secondary | ICD-10-CM

## 2023-05-28 NOTE — Telephone Encounter (Signed)
Patient is due for CPE/follow up in late December, this will be required prior to any further refills.  Please schedule, thank you!

## 2023-05-29 NOTE — Telephone Encounter (Signed)
Called patient, not able to lvm as it is full.

## 2023-06-24 ENCOUNTER — Encounter: Payer: Self-pay | Admitting: Primary Care

## 2023-06-24 NOTE — Telephone Encounter (Signed)
Error

## 2023-06-25 ENCOUNTER — Ambulatory Visit (INDEPENDENT_AMBULATORY_CARE_PROVIDER_SITE_OTHER): Payer: 59 | Admitting: Primary Care

## 2023-06-25 ENCOUNTER — Encounter: Payer: Self-pay | Admitting: Primary Care

## 2023-06-25 VITALS — BP 138/84 | HR 70 | Temp 97.5°F | Ht 65.0 in | Wt 240.0 lb

## 2023-06-25 DIAGNOSIS — E785 Hyperlipidemia, unspecified: Secondary | ICD-10-CM

## 2023-06-25 DIAGNOSIS — F411 Generalized anxiety disorder: Secondary | ICD-10-CM

## 2023-06-25 DIAGNOSIS — E66812 Obesity, class 2: Secondary | ICD-10-CM | POA: Diagnosis not present

## 2023-06-25 DIAGNOSIS — I10 Essential (primary) hypertension: Secondary | ICD-10-CM | POA: Diagnosis not present

## 2023-06-25 DIAGNOSIS — R7303 Prediabetes: Secondary | ICD-10-CM

## 2023-06-25 DIAGNOSIS — Z6829 Body mass index (BMI) 29.0-29.9, adult: Secondary | ICD-10-CM | POA: Insufficient documentation

## 2023-06-25 DIAGNOSIS — G4730 Sleep apnea, unspecified: Secondary | ICD-10-CM | POA: Diagnosis not present

## 2023-06-25 DIAGNOSIS — Z6839 Body mass index (BMI) 39.0-39.9, adult: Secondary | ICD-10-CM

## 2023-06-25 DIAGNOSIS — E6609 Other obesity due to excess calories: Secondary | ICD-10-CM | POA: Insufficient documentation

## 2023-06-25 DIAGNOSIS — Z6836 Body mass index (BMI) 36.0-36.9, adult: Secondary | ICD-10-CM | POA: Insufficient documentation

## 2023-06-25 MED ORDER — TIRZEPATIDE-WEIGHT MANAGEMENT 2.5 MG/0.5ML ~~LOC~~ SOAJ: 2.5000 mg | SUBCUTANEOUS | 0 refills | Status: DC

## 2023-06-25 NOTE — Patient Instructions (Signed)
Start tirzepitide (Zepbound) for weight loss. Start by injecting 2.5 mg into the skin once weekly for 4 weeks, then increase to 5 mg once weekly thereafter. Please notify me once you've used your last 2.5 mg pen so that I can prescribe the next dose.   Please schedule a physical to meet with me in 1 month.   It was a pleasure to see you today!

## 2023-06-25 NOTE — Assessment & Plan Note (Signed)
Situational given recent family losses.  We discussed her paroxetine 20 mg dose, she feels well managed and does not want to adjust at this point. Continue meeting with therapy.   Continue to monitor.

## 2023-06-25 NOTE — Assessment & Plan Note (Signed)
Failed Wegovy and Saxenda due to side effects.  Long discssion regarding the need to improve her diet.  Keep working on emotional eating with therapy.  Start tirzepitide for weight loss. Start by injecting 2.5 mg into the skin once weekly for 4 weeks, then increase to 5 mg once weekly thereafter.   Follow up next month.

## 2023-06-25 NOTE — Progress Notes (Signed)
Subjective:    Patient ID: Amanda Dixon, female    DOB: Aug 11, 1965, 57 y.o.   MRN: [id number]  HPI  Amanda Dixon is a very pleasant 57 y.o. female with a history of chronic migraines, hypertension, venous insufficiency, severe sleep apnea, asthma, GAD, morbid obesity, prediabetes, fatigue who presents today to discuss anxiety and obesity.   1) GAD: Chronic history of anxiety and depression. Yesterday morning she was sitting on the side of the bed, felt nasal congestion, attempted to get up, fell onto her right side hitting her ankle, right elbow, and right shoulder.   After falling out of bed, she was sitting in the bank parking lot from [employer]. She was eating a hash brown, choked, then began panicking and her body tensed up. She was able to spit up her food. She then began crying, had a hoarse voice. This episode scared her terribly. She denies a problem with choking.   Over the last 3 months she's experienced 3 deaths in the family, one being her 16 year old niece. She's also dealing with both parents who have advancing dementia.   She is managed on paroxetine 20 mg, feels well managed on this regimen. She is meeting with therapy, this has helped.   BP Readings from Last 3 Encounters:  06/25/23 138/84  04/22/23 126/68  12/05/22 123/75   2) Class 2 Obesity: Due to excess calories. Previously following with [facility] and managed on Wegovy, could not tolerate higher doses and was not losing weight on lower doses. Prescribed Saxenda which caused GI upset.   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 She is following with therapy, has been discussing her eating habits.   She has not tried Zepbound.   Body mass index is 39.94 kg/m.   Wt Readings from Last 3 Encounters:  06/25/23 240 lb (108.9 kg)  04/21/23 230 lb (104.3 kg)  12/05/22 235 lb (106.6 kg)     Review of Systems  Respiratory:  Negative for  shortness of breath.   Cardiovascular:  Negative for chest pain.  Neurological:  Negative for dizziness and headaches.  Psychiatric/Behavioral:  The patient is nervous/anxious.          Past Medical History:  Diagnosis Date   Abnormal craving 09/19/2022   Acute midline low back pain with bilateral sciatica 07/17/2022   Anemia    Anxiety    Asthma    Back pain    Bilateral swelling of feet    Depression    Epistaxis 01/24/2022   Gallbladder problem    GERD (gastroesophageal reflux disease)    High cholesterol    History of swelling of feet    Hypertension    IBS (irritable bowel syndrome)    Joint pain    Obesity    Sinus trouble    Sleep apnea    SOB (shortness of breath)    Vitamin B12 deficiency    Vitamin D deficiency     Social History   Socioeconomic History   Marital status: Married    Spouse name: Not on file   Number of children: Not on file   Years of education: Not on file   Highest education level: Not on file  Occupational History   Occupation: Research scientist (medical)  Tobacco Use   Smoking status: Never    Passive exposure: Past   Smokeless tobacco: Never   Tobacco comments:    NEVER  SMOKER:  EXPOSED TO 2ND(HUSBAND) X SEVERAL YEARS.   Vaping Use   Vaping status: Never Used  Substance and Sexual Activity   Alcohol use: No   Drug use: No   Sexual activity: Yes    Partners: Male    Birth control/protection: Surgical    Comment: Hysterectomy   Other Topics Concern   Not on file  Social History Narrative   Not on file   Social Determinants of Health   Financial Resource Strain: Not on file  Food Insecurity: Not on file  Transportation Needs: Not on file  Physical Activity: Not on file  Stress: Not on file  Social Connections: Not on file  Intimate Partner Violence: Not on file    Past Surgical History:  Procedure Laterality Date   ABDOMINAL HYSTERECTOMY  2008   supracervical   CESAREAN SECTION  1990   CHOLECYSTECTOMY  1996/97?    TONSILLECTOMY      Family History  Problem Relation Age of Onset   Allergies Mother    High Cholesterol Mother    Obesity Mother    Heart disease Father        multpile bypass   Hypertension Father    High Cholesterol Father    Sudden death Father    Obesity Father    Allergies Sister    Diabetes Sister    Asthma Sister    Eczema Sister    Cancer - Cervical Sister    Endometriosis Sister    Allergies Son    Asthma Maternal Aunt    Diabetes Paternal Aunt    Diabetes Paternal Uncle    Heart disease Maternal Grandfather    Emphysema Maternal Grandfather    Asthma Maternal Grandfather    Emphysema Paternal Grandmother    Diabetes Paternal Grandmother    Heart disease Paternal Grandfather    Allergies Other        mothers side   Asthma Other        father side   Breast cancer Neg Hx     Allergies  Allergen Reactions   Other Swelling    Bees/Wasps- redness    Current Outpatient Medications on File Prior to Visit  Medication Sig Dispense Refill   acetaminophen (TYLENOL) 325 MG tablet Take 650 mg by mouth every 6 (six) hours as needed.     albuterol (VENTOLIN HFA) 108 (90 Base) MCG/ACT inhaler TAKE 2 PUFFS BY MOUTH EVERY 6 HOURS AS NEEDED FOR WHEEZE OR SHORTNESS OF BREATH 8.5 each 0   Aspirin-Acetaminophen-Caffeine (EXCEDRIN PO) Take by mouth.     busPIRone (BUSPAR) 5 MG tablet TAKE 1 TABLET (5 MG TOTAL) BY MOUTH 2 (TWO) TIMES DAILY FOR ANXIETY. 180 tablet 1   famotidine (PEPCID) 20 MG tablet One at bedtime 30 tablet 11   losartan (COZAAR) 25 MG tablet TAKE 1 TABLET BY MOUTH EVERY DAY FOR BLOOD PRESSURE 90 tablet 2   meloxicam (MOBIC) 15 MG tablet Take 1 tablet (15 mg total) by mouth daily as needed for pain. 90 tablet 0   methocarbamol (ROBAXIN) 500 MG tablet Take 1 tablet (500 mg total) by mouth every 8 (eight) hours as needed for muscle spasms. 90 tablet 0   mometasone-formoterol (DULERA) 100-5 MCG/ACT AERO Inhale 2 puffs into the lungs 2 (two) times daily. 13 g 5    ondansetron (ZOFRAN) 4 MG tablet Take 1 tablet (4 mg total) by mouth daily as needed. 30 tablet 1   PARoxetine (PAXIL) 20 MG tablet Take 1 tablet (20 mg  total) by mouth daily. For anxiety 90 tablet 0   rosuvastatin (CRESTOR) 10 MG tablet TAKE 1 TABLET BY MOUTH EVERY DAY FOR CHOLESTEROL 90 tablet 2   SUMAtriptan (IMITREX) 50 MG tablet TAKE 1 TABLET MY MOUTH AT MIGRAINE ONSET. MAY REPEAT IN 2 HOURS IF HEADACHE PERSISTS OR RECURS. 10 tablet 5   triamcinolone (NASACORT AQ) 55 MCG/ACT nasal inhaler Place 2 sprays into the nose daily. 1 Inhaler 0   Vitamin D, Ergocalciferol, (DRISDOL) 1.25 MG (50000 UNIT) CAPS capsule Take 1 capsule (50,000 Units total) by mouth every 7 (seven) days. 13 capsule 0   No current facility-administered medications on file prior to visit.    BP 138/84   Pulse 70   Temp (!) 97.5 F (36.4 C) (Temporal)   Ht 5\' 5"  (1.651 m)   Wt 240 lb (108.9 kg)   SpO2 97%   BMI 39.94 kg/m  Objective:   Physical Exam Cardiovascular:     Rate and Rhythm: Normal rate and regular rhythm.  Pulmonary:     Effort: Pulmonary effort is normal.     Breath sounds: Normal breath sounds.  Musculoskeletal:     Cervical back: Neck supple.  Skin:    General: Skin is warm and dry.  Neurological:     Mental Status: She is alert and oriented to person, place, and time.  Psychiatric:        Mood and Affect: Mood normal.           Assessment & Plan:  Class 2 severe obesity due to excess calories with serious comorbidity and body mass index (BMI) of 39.0 to 39.9 in adult [facility]) Assessment & Plan: Failed Wegovy and Saxenda due to side effects.  Long discssion regarding the need to improve her diet.  Keep working on emotional eating with therapy.  Start tirzepitide for weight loss. Start by injecting 2.5 mg into the skin once weekly for 4 weeks, then increase to 5 mg once weekly thereafter.   Follow up next month.  Orders: -     Tirzepatide-Weight Management; Inject 2.5 mg into the  skin once a week.  Dispense: 2 mL; Refill: 0  Prediabetes -     Tirzepatide-Weight Management; Inject 2.5 mg into the skin once a week.  Dispense: 2 mL; Refill: 0  Primary hypertension -     Tirzepatide-Weight Management; Inject 2.5 mg into the skin once a week.  Dispense: 2 mL; Refill: 0  Severe sleep apnea -     Tirzepatide-Weight Management; Inject 2.5 mg into the skin once a week.  Dispense: 2 mL; Refill: 0  Hyperlipidemia, unspecified hyperlipidemia type -     Tirzepatide-Weight Management; Inject 2.5 mg into the skin once a week.  Dispense: 2 mL; Refill: 0  GAD (generalized anxiety disorder) Assessment & Plan: Situational given recent family losses.  We discussed her paroxetine 20 mg dose, she feels well managed and does not want to adjust at this point. Continue meeting with therapy.   Continue to monitor.          Doreene Nest, NP

## 2023-07-01 ENCOUNTER — Other Ambulatory Visit (HOSPITAL_COMMUNITY): Payer: Self-pay

## 2023-07-03 ENCOUNTER — Telehealth: Payer: Self-pay | Admitting: Pharmacist

## 2023-07-03 NOTE — Telephone Encounter (Signed)
Pharmacy Patient Advocate Encounter   Received notification from Physician's Office that prior authorization for Zepbound 2.5MG /0.5ML pen-injectors is required/requested.   Insurance verification completed.   The patient is insured through [employer] .   Per test claim: PA required; PA submitted to above mentioned insurance via CoverMyMeds Key/confirmation #/EOC [id number] Status is pending

## 2023-07-04 ENCOUNTER — Other Ambulatory Visit (HOSPITAL_COMMUNITY): Payer: Self-pay

## 2023-07-04 NOTE — Telephone Encounter (Signed)
Additional information has been requested from the patient's insurance in order to proceed with the prior authorization request. Requested information has been sent, or form has been filled out and faxed back to [phone]    Case ID: [id number]

## 2023-07-04 NOTE — Telephone Encounter (Addendum)
Left VM letting pharmacy know PA was approved, and sent mychart letting pt know also.

## 2023-07-04 NOTE — Telephone Encounter (Signed)
Pharmacy Patient Advocate Encounter  Received notification from [facility] that Prior Authorization for ZEPBOUND 2.5MG  has been APPROVED from 06/03/23 to 02/29/24. Ran test claim, Copay is $0. This test claim was processed through [facility]- copay amounts may vary at other pharmacies due to pharmacy/plan contracts, or as the patient moves through the different stages of their insurance plan.   PA #/Case ID/Reference #: [id number]

## 2023-07-27 ENCOUNTER — Other Ambulatory Visit: Payer: Self-pay | Admitting: Primary Care

## 2023-07-27 DIAGNOSIS — F411 Generalized anxiety disorder: Secondary | ICD-10-CM

## 2023-08-01 ENCOUNTER — Other Ambulatory Visit: Payer: Self-pay | Admitting: Primary Care

## 2023-08-01 DIAGNOSIS — E785 Hyperlipidemia, unspecified: Secondary | ICD-10-CM

## 2023-08-01 DIAGNOSIS — G4730 Sleep apnea, unspecified: Secondary | ICD-10-CM

## 2023-08-01 DIAGNOSIS — I10 Essential (primary) hypertension: Secondary | ICD-10-CM

## 2023-08-01 DIAGNOSIS — R7303 Prediabetes: Secondary | ICD-10-CM

## 2023-08-04 ENCOUNTER — Other Ambulatory Visit (HOSPITAL_COMMUNITY): Payer: Self-pay

## 2023-08-05 ENCOUNTER — Ambulatory Visit (INDEPENDENT_AMBULATORY_CARE_PROVIDER_SITE_OTHER): Payer: 59 | Admitting: Primary Care

## 2023-08-05 ENCOUNTER — Encounter: Payer: Self-pay | Admitting: Primary Care

## 2023-08-05 VITALS — BP 116/78 | HR 77 | Temp 97.3°F | Ht 65.0 in | Wt 234.0 lb

## 2023-08-05 DIAGNOSIS — G43709 Chronic migraine without aura, not intractable, without status migrainosus: Secondary | ICD-10-CM | POA: Diagnosis not present

## 2023-08-05 DIAGNOSIS — Z0000 Encounter for general adult medical examination without abnormal findings: Secondary | ICD-10-CM | POA: Diagnosis not present

## 2023-08-05 DIAGNOSIS — Z1231 Encounter for screening mammogram for malignant neoplasm of breast: Secondary | ICD-10-CM

## 2023-08-05 DIAGNOSIS — F411 Generalized anxiety disorder: Secondary | ICD-10-CM

## 2023-08-05 DIAGNOSIS — E785 Hyperlipidemia, unspecified: Secondary | ICD-10-CM | POA: Diagnosis not present

## 2023-08-05 DIAGNOSIS — J4530 Mild persistent asthma, uncomplicated: Secondary | ICD-10-CM

## 2023-08-05 DIAGNOSIS — I10 Essential (primary) hypertension: Secondary | ICD-10-CM | POA: Diagnosis not present

## 2023-08-05 DIAGNOSIS — E559 Vitamin D deficiency, unspecified: Secondary | ICD-10-CM

## 2023-08-05 DIAGNOSIS — R7303 Prediabetes: Secondary | ICD-10-CM

## 2023-08-05 DIAGNOSIS — E66812 Obesity, class 2: Secondary | ICD-10-CM

## 2023-08-05 DIAGNOSIS — Z6838 Body mass index (BMI) 38.0-38.9, adult: Secondary | ICD-10-CM

## 2023-08-05 LAB — COMPREHENSIVE METABOLIC PANEL
ALT: 17 U/L (ref 0–35)
AST: 18 U/L (ref 0–37)
Albumin: 4.3 g/dL (ref 3.5–5.2)
Alkaline Phosphatase: 75 U/L (ref 39–117)
BUN: 8 mg/dL (ref 6–23)
CO2: 32 meq/L (ref 19–32)
Calcium: 9.6 mg/dL (ref 8.4–10.5)
Chloride: 102 meq/L (ref 96–112)
Creatinine, Ser: 0.72 mg/dL (ref 0.40–1.20)
GFR: 92.66 mL/min (ref >60.00)
Glucose, Bld: 81 mg/dL (ref 70–99)
Potassium: 4.4 meq/L (ref 3.5–5.1)
Sodium: 142 meq/L (ref 135–145)
Total Bilirubin: 0.4 mg/dL (ref 0.2–1.2)
Total Protein: 6.9 g/dL (ref 6.0–8.3)

## 2023-08-05 LAB — HEMOGLOBIN A1C: Hgb A1c MFr Bld: 6.1 % (ref 4.6–6.5)

## 2023-08-05 LAB — LIPID PANEL
Cholesterol: 192 mg/dL (ref 0–200)
HDL: 40.8 mg/dL (ref >39.00)
LDL Cholesterol: 129 mg/dL — ABNORMAL HIGH (ref 0–99)
NonHDL: 151.2
Total CHOL/HDL Ratio: 5
Triglycerides: 113 mg/dL (ref 0.0–149.0)
VLDL: 22.6 mg/dL (ref 0.0–40.0)

## 2023-08-05 LAB — VITAMIN D 25 HYDROXY (VIT D DEFICIENCY, FRACTURES): VITD: 21.65 ng/mL — ABNORMAL LOW (ref 30.00–100.00)

## 2023-08-05 NOTE — Assessment & Plan Note (Signed)
 Declines influenza and Shingles vaccines.  Mammogram due, orders placed. Colonoscopy UTD, due around 2027 per patient.  Discussed the importance of a healthy diet and regular exercise in order for weight loss, and to reduce the risk of further co-morbidity.  Exam stable. Labs pending.  Follow up in 1 year for repeat physical.

## 2023-08-05 NOTE — Assessment & Plan Note (Signed)
 Stable.  Continue Dulera 100-5 mcg, 2 puffs BID, albuterol inhaler PRN.

## 2023-08-05 NOTE — Assessment & Plan Note (Signed)
Controlled. ? ?Continue losartan 25 mg daily. ? ?CMP pending. ?

## 2023-08-05 NOTE — Assessment & Plan Note (Signed)
 Controlled.  Continue sumatriptan 50 mg PRN. Continue to monitor.

## 2023-08-05 NOTE — Progress Notes (Signed)
 Subjective:    Patient ID: Tillman JONELLE Barren, female    DOB: [birth year], 58 y.o.   MRN: [id number]  HPI  DUA MEHLER is a very pleasant 58 y.o. female who presents today for complete physical and follow up of chronic conditions.  Immunizations: -Tetanus: Completed in 2020 -Influenza: Declines today -Shingles: Never completed, declines   Diet: Fair diet.  Exercise: No regular exercise.  Eye exam: Completes annually  Dental exam: Completes semi-annually    Pap Smear: Hysterectomy Mammogram: January 2024  Colonoscopy: Completed around age 83 per patient, we have no records.  She believes she was told she was due 10 years from when she had the colonoscopy.  BP Readings from Last 3 Encounters:  08/05/23 116/78  06/25/23 138/84  04/22/23 126/68    Wt Readings from Last 3 Encounters:  08/05/23 234 lb (106.1 kg)  06/25/23 240 lb (108.9 kg)  04/21/23 230 lb (104.3 kg)      Review of Systems  Constitutional:  Negative for unexpected weight change.  HENT:  Negative for rhinorrhea.   Respiratory:  Negative for cough and shortness of breath.   Cardiovascular:  Negative for chest pain.  Gastrointestinal:  Negative for constipation and diarrhea.  Genitourinary:  Negative for difficulty urinating and menstrual problem.  Musculoskeletal:  Negative for arthralgias and myalgias.  Skin:  Negative for rash.  Allergic/Immunologic: Negative for environmental allergies.  Neurological:  Negative for dizziness, numbness and headaches.  Psychiatric/Behavioral:  The patient is not nervous/anxious.          Past Medical History:  Diagnosis Date   Abnormal craving 09/19/2022   Acute midline low back pain with bilateral sciatica 07/17/2022   Anemia    Anxiety    Asthma    Back pain    Bilateral swelling of feet    Depression    Epistaxis 01/24/2022   Gallbladder problem    GERD (gastroesophageal reflux disease)    High cholesterol    History of swelling of feet     Hypertension    IBS (irritable bowel syndrome)    Joint pain    Obesity    Sinus trouble    Sleep apnea    SOB (shortness of breath)    Vitamin B12 deficiency    Vitamin D  deficiency     Social History   Socioeconomic History   Marital status: Married    Spouse name: Not on file   Number of children: Not on file   Years of education: Not on file   Highest education level: Not on file  Occupational History   Occupation: research scientist (medical)  Tobacco Use   Smoking status: Never    Passive exposure: Past   Smokeless tobacco: Never   Tobacco comments:    NEVER SMOKER:  EXPOSED TO 2ND(HUSBAND) X SEVERAL YEARS.   Vaping Use   Vaping status: Never Used  Substance and Sexual Activity   Alcohol use: No   Drug use: No   Sexual activity: Yes    Partners: Male    Birth control/protection: Surgical    Comment: Hysterectomy   Other Topics Concern   Not on file  Social History Narrative   Not on file   Social Drivers of Health   Financial Resource Strain: Not on file  Food Insecurity: Not on file  Transportation Needs: Not on file  Physical Activity: Not on file  Stress: Not on file  Social Connections: Not on file  Intimate Partner Violence: Not on file  Past Surgical History:  Procedure Laterality Date   ABDOMINAL HYSTERECTOMY  2008   supracervical   CESAREAN SECTION  1990   CHOLECYSTECTOMY  1996/97?   TONSILLECTOMY      Family History  Problem Relation Age of Onset   Allergies Mother    High Cholesterol Mother    Obesity Mother    Heart disease Father        multpile bypass   Hypertension Father    High Cholesterol Father    Sudden death Father    Obesity Father    Allergies Sister    Diabetes Sister    Asthma Sister    Eczema Sister    Cancer - Cervical Sister    Endometriosis Sister    Allergies Son    Asthma Maternal Aunt    Diabetes Paternal Aunt    Diabetes Paternal Uncle    Heart disease Maternal Grandfather    Emphysema Maternal  Grandfather    Asthma Maternal Grandfather    Emphysema Paternal Grandmother    Diabetes Paternal Grandmother    Heart disease Paternal Grandfather    Allergies Other        mothers side   Asthma Other        father side   Breast cancer Neg Hx     Allergies  Allergen Reactions   Other Swelling    Bees/Wasps- redness    Current Outpatient Medications on File Prior to Visit  Medication Sig Dispense Refill   albuterol  (VENTOLIN  HFA) 108 (90 Base) MCG/ACT inhaler TAKE 2 PUFFS BY MOUTH EVERY 6 HOURS AS NEEDED FOR WHEEZE OR SHORTNESS OF BREATH 8.5 each 0   busPIRone  (BUSPAR ) 5 MG tablet TAKE 1 TABLET (5 MG TOTAL) BY MOUTH 2 (TWO) TIMES DAILY FOR ANXIETY. 180 tablet 1   famotidine  (PEPCID ) 20 MG tablet One at bedtime 30 tablet 11   losartan  (COZAAR ) 25 MG tablet TAKE 1 TABLET BY MOUTH EVERY DAY FOR BLOOD PRESSURE 90 tablet 2   meloxicam  (MOBIC ) 15 MG tablet Take 1 tablet (15 mg total) by mouth daily as needed for pain. 90 tablet 0   methocarbamol  (ROBAXIN ) 500 MG tablet Take 1 tablet (500 mg total) by mouth every 8 (eight) hours as needed for muscle spasms. 90 tablet 0   mometasone-formoterol  (DULERA) 100-5 MCG/ACT AERO Inhale 2 puffs into the lungs 2 (two) times daily. 13 g 5   ondansetron  (ZOFRAN ) 4 MG tablet Take 1 tablet (4 mg total) by mouth daily as needed. 30 tablet 1   PARoxetine  (PAXIL ) 20 MG tablet TAKE 1 TABLET (20 MG TOTAL) BY MOUTH DAILY. FOR ANXIETY 90 tablet 1   rosuvastatin  (CRESTOR ) 10 MG tablet TAKE 1 TABLET BY MOUTH EVERY DAY FOR CHOLESTEROL 90 tablet 2   SUMAtriptan  (IMITREX ) 50 MG tablet TAKE 1 TABLET MY MOUTH AT MIGRAINE ONSET. MAY REPEAT IN 2 HOURS IF HEADACHE PERSISTS OR RECURS. 10 tablet 5   tirzepatide  (MOUNJARO ) 5 MG/0.5ML Pen Inject 5 mg into the skin once a week. 2 mL 0   triamcinolone  (NASACORT  AQ) 55 MCG/ACT nasal inhaler Place 2 sprays into the nose daily. 1 Inhaler 0   acetaminophen  (TYLENOL ) 325 MG tablet Take 650 mg by mouth every 6 (six) hours as needed.  (Patient not taking: Reported on 08/05/2023)     No current facility-administered medications on file prior to visit.    BP 116/78   Pulse 77   Temp (!) 97.3 F (36.3 C) (Temporal)   Ht 5' 5 (1.651 m)  Wt 234 lb (106.1 kg)   SpO2 100%   BMI 38.94 kg/m  Objective:   Physical Exam HENT:     Right Ear: Tympanic membrane and ear canal normal.     Left Ear: Tympanic membrane and ear canal normal.  Eyes:     Pupils: Pupils are equal, round, and reactive to light.  Cardiovascular:     Rate and Rhythm: Normal rate and regular rhythm.  Pulmonary:     Effort: Pulmonary effort is normal.     Breath sounds: Normal breath sounds.  Abdominal:     General: Bowel sounds are normal.     Palpations: Abdomen is soft.     Tenderness: There is no abdominal tenderness.  Musculoskeletal:        General: Normal range of motion.     Cervical back: Neck supple.  Skin:    General: Skin is warm and dry.  Neurological:     Mental Status: She is alert and oriented to person, place, and time.     Cranial Nerves: No cranial nerve deficit.     Deep Tendon Reflexes:     Reflex Scores:      Patellar reflexes are 2+ on the right side and 2+ on the left side. Psychiatric:        Mood and Affect: Mood normal.           Assessment & Plan:  Preventative health care Assessment & Plan: Declines influenza and Shingles vaccines.  Mammogram due, orders placed. Colonoscopy UTD, due around 2027 per patient.  Discussed the importance of a healthy diet and regular exercise in order for weight loss, and to reduce the risk of further co-morbidity.  Exam stable. Labs pending.  Follow up in 1 year for repeat physical.    Screening mammogram for breast cancer -     3D Screening Mammogram, Left and Right; Future  Primary hypertension Assessment & Plan: Controlled.  Continue losartan  25 mg daily. CMP pending.  Orders: -     Comprehensive metabolic panel  Chronic migraine without aura without  status migrainosus, not intractable Assessment & Plan: Controlled.  Continue sumatriptan  50 mg PRN. Continue to monitor.    Mild persistent asthma without complication Assessment & Plan: Stable.  Continue Dulera 100-5 mcg, 2 puffs BID, albuterol  inhaler PRN.    Class 2 severe obesity due to excess calories with serious comorbidity and body mass index (BMI) of 38.0 to 38.9 in adult [facility]) Assessment & Plan: Weight loss of 6 pounds since last visit!  Continue with Mounjaro  5 mg weekly with plans to titrate upward. She will update.   Close follow up.   GAD (generalized anxiety disorder) Assessment & Plan: Improving.  Continue paroxetine  20 mg daily, buspirone  5 mg BID PRN. Continue with regular therapy.      Hyperlipidemia, unspecified hyperlipidemia type Assessment & Plan: Repeat lipid panel pending.  Continue rosuvastatin  10 mg daily.  Orders: -     Lipid panel  Prediabetes Assessment & Plan: Repeat A1C pending.  Commended her on weight loss. Continue Mounjaro .   Orders: -     Hemoglobin A1c  Vitamin D  deficiency Assessment & Plan: Not currently taking vitamin D  supplement. Repeat vitamin D  level pending.  Orders: -     VITAMIN D  25 Hydroxy (Vit-D Deficiency, Fractures)        Evonte Prestage K Chandlar Guice, NP

## 2023-08-05 NOTE — Patient Instructions (Signed)
 Stop by the lab prior to leaving today. I will notify you of your results once received.   [facility] to schedule your mamm[facility].   Consider the Shingles vaccine.  Please schedule a follow up visit for 3 months.  It was a pleasure to see you today!

## 2023-08-05 NOTE — Assessment & Plan Note (Signed)
 Repeat A1C pending.  Commended her on weight loss. Continue Mounjaro.

## 2023-08-05 NOTE — Assessment & Plan Note (Signed)
 Improving.  Continue paroxetine 20 mg daily, buspirone 5 mg BID PRN. Continue with regular therapy.

## 2023-08-05 NOTE — Assessment & Plan Note (Signed)
 Not currently taking vitamin D supplement. Repeat vitamin D level pending.

## 2023-08-05 NOTE — Assessment & Plan Note (Signed)
 Weight loss of 6 pounds since last visit!  Continue with Mounjaro 5 mg weekly with plans to titrate upward. She will update.   Close follow up.

## 2023-08-05 NOTE — Assessment & Plan Note (Signed)
 Repeat lipid panel pending. Continue rosuvastatin 10 mg daily.

## 2023-08-06 NOTE — Telephone Encounter (Signed)
 Can we check on PA for Amanda Dixon?  We increased her dose from 2.5 mg to 5 mg.  It was approved last year so it is probably needing another authorization for this year.

## 2023-08-11 ENCOUNTER — Other Ambulatory Visit: Payer: Self-pay | Admitting: Primary Care

## 2023-08-11 DIAGNOSIS — I10 Essential (primary) hypertension: Secondary | ICD-10-CM

## 2023-08-12 ENCOUNTER — Ambulatory Visit: Payer: Self-pay | Admitting: Primary Care

## 2023-08-12 NOTE — Telephone Encounter (Signed)
 Noted.  I do not treat patients for symptoms without an office visit.  Agree with office visit disposition.

## 2023-08-12 NOTE — Telephone Encounter (Signed)
  Chief Complaint: sore throat, cough Symptoms: pain, congestion, cough Frequency: began yesterday Pertinent Negatives: Patient denies Fever, runny nose, chest pain, rash Disposition: [] ED /[] Urgent Care (no appt availability in office) / [x] Appointment(In office/virtual)/ []  [facility]/ [] Home Care/ [] Refused Recommended Disposition /[]  Mobile Bus/ []  Follow-up with PCP Additional Notes: Patient calls stating she began having sore throat and cough yesterday, now experience fatigue, congestion as well. Patient reports she has had possible exposure to people with strep throat. States she has some SOB when coughing. Per protocol, patient to be evaluated within 24 hours. Patient scheduled for first available with any provider for 08/13/23 at 0840. Care advice reviewed, patient verbalized understanding. Alerting PCP for review.   Copied from CRM [id number]. Topic: Clinical - Medication Question >> Aug 12, 2023  1:07 PM [name] wrote: Reason for CRM: pt had a sore throat yesterday and is coughing all day today. Pt would like to know if Dr. Gretta can send her something in (pt is stating she done this before) or if there's is something she can get over the counter or will she have to make an appointment to be seen. Reason for Disposition  [1] Exposure to family member (or spouse or boyfriend/girlfriend) with test-proven strep AND [2] within last 10 days  Answer Assessment - Initial Assessment Questions 1. ONSET: When did the throat start hurting? (Hours or days ago)      Began yesterday 2. SEVERITY: How bad is the sore throat? (Scale 1-10; mild, moderate or severe)   - MILD (1-3):  Doesn't interfere with eating or normal activities.   - MODERATE (4-7): Interferes with eating some solids and normal activities.   - SEVERE (8-10):  Excruciating pain, interferes with most normal activities.   - SEVERE WITH DYSPHAGIA (10): Can't swallow liquids, drooling.     7/10 with cough 3.  STREP EXPOSURE: Has there been any exposure to strep within the past week? If Yes, ask: What type of contact occurred?      Unsure 4.  VIRAL SYMPTOMS: Are there any symptoms of a cold, such as a runny nose, cough, hoarse voice or red eyes?      Cough, hoarse voice, headache 5. FEVER: Do you have a fever? If Yes, ask: What is your temperature, how was it measured, and when did it start?     Unsure, has not checked 6. PUS ON THE TONSILS: Is there pus on the tonsils in the back of your throat?     Denies 7. OTHER SYMPTOMS: Do you have any other symptoms? (e.g., difficulty breathing, headache, rash)     Headache, SOB with coughing  Protocols used: Sore Throat-A-AH

## 2023-08-13 ENCOUNTER — Ambulatory Visit: Payer: 59 | Admitting: Family

## 2023-08-13 ENCOUNTER — Telehealth: Payer: Self-pay | Admitting: Family

## 2023-08-13 ENCOUNTER — Encounter: Payer: Self-pay | Admitting: Family

## 2023-08-13 VITALS — BP 132/74 | HR 74 | Temp 98.0°F | Ht 65.0 in | Wt 234.0 lb

## 2023-08-13 DIAGNOSIS — J069 Acute upper respiratory infection, unspecified: Secondary | ICD-10-CM

## 2023-08-13 DIAGNOSIS — E66812 Obesity, class 2: Secondary | ICD-10-CM

## 2023-08-13 DIAGNOSIS — E785 Hyperlipidemia, unspecified: Secondary | ICD-10-CM

## 2023-08-13 DIAGNOSIS — R051 Acute cough: Secondary | ICD-10-CM

## 2023-08-13 DIAGNOSIS — R7303 Prediabetes: Secondary | ICD-10-CM

## 2023-08-13 DIAGNOSIS — J029 Acute pharyngitis, unspecified: Secondary | ICD-10-CM | POA: Diagnosis not present

## 2023-08-13 DIAGNOSIS — I10 Essential (primary) hypertension: Secondary | ICD-10-CM

## 2023-08-13 LAB — POCT COVID BINAXNOW CARD: SARS Coronavirus 2 Ag: NEGATIVE

## 2023-08-13 LAB — POCT FLU A/B STATUS
Influenza A, POC: NEGATIVE
Influenza B, POC: NEGATIVE

## 2023-08-13 LAB — POCT RAPID STREP A (OFFICE): Rapid Strep A Screen: NEGATIVE

## 2023-08-13 MED ORDER — TIRZEPATIDE-WEIGHT MANAGEMENT 5 MG/0.5ML ~~LOC~~ SOAJ: 5.0000 mg | SUBCUTANEOUS | 0 refills | Status: DC

## 2023-08-13 NOTE — Telephone Encounter (Signed)
 Conflict with GLP 1 (just saw your patient today in office for virus)   Pt unable to get zepbound  5 filled at pharmacy  Looking at the chart zepbound  was approved in December 2024 and she got the 2.5 dose however from what I see mounjaro  was accidentally RX in January. mounjaro  cancelled out the zepbound  as far as prior auth.   Looks like possibly you just have to resend the zepbound  in for the 5 mg.

## 2023-08-13 NOTE — Addendum Note (Signed)
 Addended by: Cassadi Purdie K on: 08/13/2023 02:08 PM   Modules accepted: Orders

## 2023-08-13 NOTE — Assessment & Plan Note (Signed)
 Covid strep and flu negative.  Advised patient on supportive measures:  Be sure to rest, drink plenty of fluids, and use tylenol  or ibuprofen  as needed for pain. Follow up if fever >101, if symptoms worsen or if symptoms are not improved in 3 days. Patient verbalizes understanding.

## 2023-08-13 NOTE — Progress Notes (Signed)
 Established Patient Office Visit  Subjective:   Patient ID: Amanda Dixon, female    DOB: [birth year]  Age: 58 y.o. MRN: [id number]  CC:  Chief Complaint  Patient presents with   Cough    Sore throat x 2 days     HPI: Amanda Dixon is a 58 y.o. female presenting on 08/13/2023 for Cough (Sore throat x 2 days )  Two days ago started with sore throat and also a cough. Had a headache yesterday as well, sinus headache. The cough is  With nasal congestion and pnd. No fever no wheezing.  No ear pain. Feeling slightly better today.   Did try nyquil last night which allowed her to sleep.       ROS: Negative unless specifically indicated above in HPI.   Relevant past medical history reviewed and updated as indicated.   Allergies and medications reviewed and updated.   Current Outpatient Medications:    acetaminophen  (TYLENOL ) 325 MG tablet, Take 650 mg by mouth every 6 (six) hours as needed., Disp: , Rfl:    albuterol  (VENTOLIN  HFA) 108 (90 Base) MCG/ACT inhaler, TAKE 2 PUFFS BY MOUTH EVERY 6 HOURS AS NEEDED FOR WHEEZE OR SHORTNESS OF BREATH, Disp: 8.5 each, Rfl: 0   busPIRone  (BUSPAR ) 5 MG tablet, TAKE 1 TABLET (5 MG TOTAL) BY MOUTH 2 (TWO) TIMES DAILY FOR ANXIETY., Disp: 180 tablet, Rfl: 1   famotidine  (PEPCID ) 20 MG tablet, One at bedtime, Disp: 30 tablet, Rfl: 11   losartan  (COZAAR ) 25 MG tablet, TAKE 1 TABLET BY MOUTH EVERY DAY FOR BLOOD PRESSURE, Disp: 90 tablet, Rfl: 3   meloxicam  (MOBIC ) 15 MG tablet, Take 1 tablet (15 mg total) by mouth daily as needed for pain., Disp: 90 tablet, Rfl: 0   methocarbamol  (ROBAXIN ) 500 MG tablet, Take 1 tablet (500 mg total) by mouth every 8 (eight) hours as needed for muscle spasms., Disp: 90 tablet, Rfl: 0   mometasone-formoterol  (DULERA) 100-5 MCG/ACT AERO, Inhale 2 puffs into the lungs 2 (two) times daily., Disp: 13 g, Rfl: 5   ondansetron  (ZOFRAN ) 4 MG tablet, Take 1 tablet (4 mg total) by mouth daily as needed., Disp: 30 tablet, Rfl:  1   PARoxetine  (PAXIL ) 20 MG tablet, TAKE 1 TABLET (20 MG TOTAL) BY MOUTH DAILY. FOR ANXIETY, Disp: 90 tablet, Rfl: 1   rosuvastatin  (CRESTOR ) 10 MG tablet, TAKE 1 TABLET BY MOUTH EVERY DAY FOR CHOLESTEROL, Disp: 90 tablet, Rfl: 2   SUMAtriptan  (IMITREX ) 50 MG tablet, TAKE 1 TABLET MY MOUTH AT MIGRAINE ONSET. MAY REPEAT IN 2 HOURS IF HEADACHE PERSISTS OR RECURS., Disp: 10 tablet, Rfl: 5   triamcinolone  (NASACORT  AQ) 55 MCG/ACT nasal inhaler, Place 2 sprays into the nose daily., Disp: 1 Inhaler, Rfl: 0   tirzepatide  (MOUNJARO ) 5 MG/0.5ML Pen, Inject 5 mg into the skin once a week. (Patient not taking: Reported on 08/13/2023), Disp: 2 mL, Rfl: 0  Allergies  Allergen Reactions   Other Swelling    Bees/Wasps- redness    Objective:   BP 132/74   Pulse 74   Temp 98 F (36.7 C) (Temporal)   Ht 5\' 5"  (1.651 m)   Wt 234 lb (106.1 kg)   SpO2 97%   BMI 38.94 kg/m    Physical Exam Vitals reviewed.  Constitutional:      General: She is not in acute distress.    Appearance: Normal appearance. She is normal weight. She is not ill-appearing, toxic-appearing or diaphoretic.  HENT:  Head: Normocephalic.     Right Ear: Tympanic membrane normal.     Left Ear: Tympanic membrane normal.     Nose:     Right Sinus: Frontal sinus tenderness present.     Left Sinus: Frontal sinus tenderness present.     Mouth/Throat:     Mouth: Mucous membranes are dry.     Pharynx: Posterior oropharyngeal erythema and postnasal drip present. No oropharyngeal exudate.  Eyes:     Extraocular Movements: Extraocular movements intact.     Pupils: Pupils are equal, round, and reactive to light.  Cardiovascular:     Rate and Rhythm: Normal rate and regular rhythm.     Pulses: Normal pulses.     Heart sounds: Normal heart sounds.  Pulmonary:     Effort: Pulmonary effort is normal.     Breath sounds: Normal breath sounds.  Musculoskeletal:     Cervical back: Normal range of motion.  Neurological:     General: No  focal deficit present.     Mental Status: She is alert and oriented to person, place, and time. Mental status is at baseline.  Psychiatric:        Mood and Affect: Mood normal.        Behavior: Behavior normal.        Thought Content: Thought content normal.        Judgment: Judgment normal.     Assessment & Plan:  Acute cough -     POCT COVID BINAX NOW CARD -     POCT Flu A & B Status -     POCT rapid strep A  Sore throat -     POCT COVID BINAX NOW CARD -     POCT Flu A & B Status -     POCT rapid strep A  Viral upper respiratory infection Assessment & Plan: Covid strep and flu negative.  Advised patient on supportive measures:  Be sure to rest, drink plenty of fluids, and use tylenol  or ibuprofen  as needed for pain. Follow up if fever >101, if symptoms worsen or if symptoms are not improved in 3 days. Patient verbalizes understanding.        Follow up plan: Return for f/u PCP if no improvement in symptoms.  Felicita Horns, FNP

## 2023-08-13 NOTE — Telephone Encounter (Signed)
 Yes, Zepbound  5 mg dose was sent to pharmacy on 08/01/23.   Kelli, can we check with the pharmacy to find out what's going on?

## 2023-08-15 ENCOUNTER — Other Ambulatory Visit (HOSPITAL_COMMUNITY): Payer: Self-pay

## 2023-08-15 ENCOUNTER — Telehealth: Payer: Self-pay

## 2023-08-15 NOTE — Telephone Encounter (Signed)
Per test claim: Refill too soon. PA is not needed at this time. Medication was filled 08/13/2023. Next eligible fill date is 09/02/2023.

## 2023-08-15 NOTE — Telephone Encounter (Signed)
Pharmacy Patient Advocate Encounter   Received notification from Patient Advice Request messages that prior authorization for Zepbound is required/requested.   Insurance verification completed.   The patient is insured through [employer] .   Per test claim: Refill too soon. PA is not needed at this time. Medication was filled 08/13/2023. Next eligible fill date is 09/02/2023.

## 2023-09-03 ENCOUNTER — Other Ambulatory Visit: Payer: Self-pay | Admitting: Primary Care

## 2023-09-03 DIAGNOSIS — E785 Hyperlipidemia, unspecified: Secondary | ICD-10-CM

## 2023-09-05 ENCOUNTER — Other Ambulatory Visit: Payer: Self-pay | Admitting: Primary Care

## 2023-09-05 DIAGNOSIS — E785 Hyperlipidemia, unspecified: Secondary | ICD-10-CM

## 2023-09-05 DIAGNOSIS — R7303 Prediabetes: Secondary | ICD-10-CM

## 2023-09-05 DIAGNOSIS — I10 Essential (primary) hypertension: Secondary | ICD-10-CM

## 2023-10-02 MED ORDER — TIRZEPATIDE-WEIGHT MANAGEMENT 5 MG/0.5ML ~~LOC~~ SOLN: 5.0000 mg | SUBCUTANEOUS | 0 refills | Status: DC

## 2023-10-03 ENCOUNTER — Ambulatory Visit
Admission: EM | Admit: 2023-10-03 | Discharge: 2023-10-03 | Disposition: A | Discharge status: Home / Self Care | Attending: Emergency Medicine | Admitting: Emergency Medicine

## 2023-10-03 DIAGNOSIS — B349 Viral infection, unspecified: Secondary | ICD-10-CM | POA: Diagnosis not present

## 2023-10-03 LAB — POCT RAPID STREP A (OFFICE): Rapid Strep A Screen: NEGATIVE

## 2023-10-03 LAB — POC COVID19/FLU A&B COMBO
Covid Antigen, POC: NEGATIVE
Influenza A Antigen, POC: NEGATIVE
Influenza B Antigen, POC: NEGATIVE

## 2023-10-03 NOTE — ED Triage Notes (Signed)
 Patient to Urgent Care with complaints of bilateral ear pain/ hoarseness/ drainage/  sore throat/ congested cough/ sinuses/ nasal congestion. Denies known fevers( husband reports she was clammy/ hot last night).  Reports symptoms started Wednesday night.   Meds: Nyquil.

## 2023-10-03 NOTE — Discharge Instructions (Addendum)
 The strep, COVID and flu tests are negative.   Take Tylenol or ibuprofen as needed for fever or discomfort.  Take plain Mucinex as needed for congestion.  Rest and keep yourself hydrated.    Follow-up with your primary care provider if your symptoms are not improving.

## 2023-10-03 NOTE — ED Provider Notes (Signed)
 Amanda Dixon    CSN: [id number] Arrival date & time: 10/03/23  1240      History   Chief Complaint Chief Complaint  Patient presents with   Otalgia   Cough    HPI Amanda Dixon is a 58 y.o. female.  Patient presents with 2-day history of postnasal drip, congestion, ear pain, hoarse voice, sore throat, cough.  No fever, wheezing, shortness of breath.  She has been treating her symptoms with NyQuil.  The history is provided by the patient and medical records.    Past Medical History:  Diagnosis Date   Abnormal craving 09/19/2022   Acute midline low back pain with bilateral sciatica 07/17/2022   Anemia    Anxiety    Asthma    Back pain    Bilateral swelling of feet    Depression    Epistaxis 01/24/2022   Gallbladder problem    GERD (gastroesophageal reflux disease)    High cholesterol    History of swelling of feet    Hypertension    IBS (irritable bowel syndrome)    Joint pain    Obesity    Sinus trouble    Sleep apnea    SOB (shortness of breath)    Vitamin B12 deficiency    Vitamin D deficiency     Patient Active Problem List   Diagnosis Date Noted   Class 2 obesity due to excess calories with body mass index (BMI) of 38.0 to 38.9 in adult 06/25/2023   Other Specified Feeding or Eating Disorder, Emotional Eating Behaviors 10/14/2022   Vitamin D deficiency 08/29/2022   Chronic back pain 07/08/2022   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   Other fatigue 05/14/2022   Depression 05/14/2022   Severe sleep apnea 03/22/2022   Chronic fatigue 12/21/2021   Prediabetes 12/21/2021   Primary hypertension 08/07/2021   Venous insufficiency of both lower extremities 08/07/2021   Hyperlipidemia 07/10/2021   Chronic migraine without aura without status migrainosus, not intractable 09/20/2019   Peripheral edema 09/20/2019   Allergy to bee sting 01/25/2019   Tinea cruris 11/21/2017   Asthma 10/07/2016   Seasonal allergies 10/07/2016   Morbid obesity (HCC) 10/07/2016    GAD (generalized anxiety disorder) 10/07/2016   Chronic cough 02/22/2013    Past Surgical History:  Procedure Laterality Date   ABDOMINAL HYSTERECTOMY  2008   supracervical   CESAREAN SECTION  1990   CHOLECYSTECTOMY  1996/97?   TONSILLECTOMY      OB History     Gravida  1   Para  1   Term  1   Preterm      AB      Living  1      SAB      IAB      Ectopic      Multiple      Live Births  1            Home Medications    Prior to Admission medications   Medication Sig Start Date End Date Taking? Authorizing Provider  acetaminophen (TYLENOL) 325 MG tablet Take 650 mg by mouth every 6 (six) hours as needed.    [provider]  albuterol (VENTOLIN HFA) 108 (90 Base) MCG/ACT inhaler TAKE 2 PUFFS BY MOUTH EVERY 6 HOURS AS NEEDED FOR WHEEZE OR SHORTNESS OF BREATH 05/28/23   Doreene Nest, NP  busPIRone (BUSPAR) 5 MG tablet TAKE 1 TABLET (5 MG TOTAL) BY MOUTH 2 (TWO) TIMES DAILY FOR ANXIETY. 07/31/22  Doreene Nest, NP  famotidine (PEPCID) 20 MG tablet One at bedtime 03/04/18   Nyoka Cowden, MD  losartan (COZAAR) 25 MG tablet TAKE 1 TABLET BY MOUTH EVERY DAY FOR BLOOD PRESSURE 08/11/23   Doreene Nest, NP  meloxicam (MOBIC) 15 MG tablet Take 1 tablet (15 mg total) by mouth daily as needed for pain. 08/06/22   Doreene Nest, NP  methocarbamol (ROBAXIN) 500 MG tablet Take 1 tablet (500 mg total) by mouth every 8 (eight) hours as needed for muscle spasms. 08/06/22   Doreene Nest, NP  mometasone-formoterol (DULERA) 100-5 MCG/ACT AERO Inhale 2 puffs into the lungs 2 (two) times daily. 01/24/22   Doreene Nest, NP  ondansetron (ZOFRAN) 4 MG tablet Take 1 tablet (4 mg total) by mouth daily as needed. 04/22/23 04/21/24  Pilar Jarvis, MD  PARoxetine (PAXIL) 20 MG tablet TAKE 1 TABLET (20 MG TOTAL) BY MOUTH DAILY. FOR ANXIETY 07/27/23   Doreene Nest, NP  rosuvastatin (CRESTOR) 10 MG tablet TAKE 1 TABLET BY MOUTH EVERY DAY FOR CHOLESTEROL  09/03/23   Doreene Nest, NP  SUMAtriptan (IMITREX) 50 MG tablet TAKE 1 TABLET MY MOUTH AT MIGRAINE ONSET. MAY REPEAT IN 2 HOURS IF HEADACHE PERSISTS OR RECURS. 10/18/19   Doreene Nest, NP  tirzepatide 5 MG/0.5ML injection vial Inject 5 mg into the skin once a week. 10/02/23   Doreene Nest, NP  triamcinolone (NASACORT AQ) 55 MCG/ACT nasal inhaler Place 2 sprays into the nose daily. 02/22/13   Lupita Leash, MD    Family History Family History  Problem Relation Age of Onset   Allergies Mother    High Cholesterol Mother    Obesity Mother    Heart disease Father        multpile bypass   Hypertension Father    High Cholesterol Father    Sudden death Father    Obesity Father    Allergies Sister    Diabetes Sister    Asthma Sister    Eczema Sister    Cancer - Cervical Sister    Endometriosis Sister    Allergies Son    Asthma Maternal Aunt    Diabetes Paternal Aunt    Diabetes Paternal Uncle    Heart disease Maternal Grandfather    Emphysema Maternal Grandfather    Asthma Maternal Grandfather    Emphysema Paternal Grandmother    Diabetes Paternal Grandmother    Heart disease Paternal Grandfather    Allergies Other        mothers side   Asthma Other        father side   Breast cancer Neg Hx     Social History Social History   Tobacco Use   Smoking status: Never    Passive exposure: Past   Smokeless tobacco: Never   Tobacco comments:    NEVER SMOKER:  EXPOSED TO 2ND(HUSBAND) X SEVERAL YEARS.   Vaping Use   Vaping status: Never Used  Substance Use Topics   Alcohol use: No   Drug use: No     Allergies   Other   Review of Systems Review of Systems  Constitutional:  Negative for chills and fever.  HENT:  Positive for congestion, ear pain, postnasal drip, rhinorrhea and sore throat.   Respiratory:  Positive for cough. Negative for shortness of breath and wheezing.   Gastrointestinal:  Negative for diarrhea and vomiting.     Physical  Exam Triage Vital Signs ED Triage Vitals  Encounter Vitals Group     BP 10/03/23 1339 135/72     Systolic BP Percentile --      Diastolic BP Percentile --      Pulse Rate 10/03/23 1339 78     Resp 10/03/23 1339 18     Temp 10/03/23 1339 99.3 F (37.4 C)     Temp src --      SpO2 10/03/23 1339 95 %     Weight --      Height --      Head Circumference --      Peak Flow --      Pain Score 10/03/23 1356 7     Pain Loc --      Pain Education --      Exclude from Growth Chart --    No data found.  Updated Vital Signs BP 135/72   Pulse 78   Temp 99.3 F (37.4 C)   Resp 18   SpO2 95%   Visual Acuity Right Eye Distance:   Left Eye Distance:   Bilateral Distance:    Right Eye Near:   Left Eye Near:    Bilateral Near:     Physical Exam Constitutional:      General: She is not in acute distress. HENT:     Right Ear: Tympanic membrane normal.     Left Ear: Tympanic membrane normal.     Nose: Congestion and rhinorrhea present.     Mouth/Throat:     Mouth: Mucous membranes are moist.     Pharynx: Oropharynx is clear.  Cardiovascular:     Rate and Rhythm: Normal rate and regular rhythm.     Heart sounds: Normal heart sounds.  Pulmonary:     Effort: Pulmonary effort is normal. No respiratory distress.     Breath sounds: Normal breath sounds. No wheezing.  Neurological:     Mental Status: She is alert.      UC Treatments / Results  Labs (all labs ordered are listed, but only abnormal results are displayed) Labs Reviewed  POC COVID19/FLU A&B COMBO - Normal  POCT RAPID STREP A (OFFICE) - Normal    EKG   Radiology No results found.  Procedures Procedures (including critical care time)  Medications Ordered in UC Medications - No data to display  Initial Impression / Assessment and Plan / UC Course  I have reviewed the triage vital signs and the nursing notes.  Pertinent labs & imaging results that were available during my care of the patient were  reviewed by me and considered in my medical decision making (see chart for details).    Viral illness.  Rapid strep negative.  Rapid COVID and flu negative.  Discussed symptomatic treatment including Tylenol or ibuprofen as needed for fever or discomfort, plain Mucinex as needed for congestion, rest, hydration.  Instructed patient to follow-up with PCP if not improving.  ED precautions given.  Patient agrees to plan of care.  Final Clinical Impressions(s) / UC Diagnoses   Final diagnoses:  Viral illness     Discharge Instructions      The strep, COVID and flu tests are negative.   Take Tylenol or ibuprofen as needed for fever or discomfort.  Take plain Mucinex as needed for congestion.  Rest and keep yourself hydrated.    Follow-up with your primary care provider if your symptoms are not improving.         ED Prescriptions   None    PDMP not reviewed  this encounter.   Mickie Bail, NP 10/03/23 [phone]

## 2023-10-06 ENCOUNTER — Ambulatory Visit

## 2023-10-06 ENCOUNTER — Ambulatory Visit (INDEPENDENT_AMBULATORY_CARE_PROVIDER_SITE_OTHER): Admitting: Family

## 2023-10-06 ENCOUNTER — Encounter: Payer: Self-pay | Admitting: Family

## 2023-10-06 VITALS — BP 126/86 | HR 84 | Temp 97.7°F | Ht 65.0 in | Wt 220.0 lb

## 2023-10-06 DIAGNOSIS — J069 Acute upper respiratory infection, unspecified: Secondary | ICD-10-CM

## 2023-10-06 DIAGNOSIS — B9689 Other specified bacterial agents as the cause of diseases classified elsewhere: Secondary | ICD-10-CM | POA: Diagnosis not present

## 2023-10-06 DIAGNOSIS — H109 Unspecified conjunctivitis: Secondary | ICD-10-CM | POA: Insufficient documentation

## 2023-10-06 MED ORDER — CEFDINIR 300 MG PO CAPS: 300.0000 mg | ORAL_CAPSULE | Freq: Two times a day (BID) | ORAL | 0 refills | Status: AC

## 2023-10-06 MED ORDER — OFLOXACIN 0.3 % OP SOLN: 1.0000 [drp] | Freq: Four times a day (QID) | OPHTHALMIC | 0 refills | Status: AC

## 2023-10-06 NOTE — Progress Notes (Signed)
 Established Patient Office Visit  Subjective:   Patient ID: Amanda Dixon, female    DOB: 09-29-1965  Age: 58 y.o. MRN: [id number]  CC:  Chief Complaint  Patient presents with   Acute Visit    Sore throat, ear pain, pink eye    HPI: Amanda Dixon is a 58 y.o. female presenting on 10/06/2023 for Acute Visit (Sore throat, ear pain, pink eye)  3/7 went to urgent care tested for covid, strep and flu were negative.  She has been taking otc nyqil and dayquil for high blood pressure with ibuprofen.   She still continues with symptoms, and they are worsening Her throat is very painful, her ears hurt, and she has pressure in her ears as well. She also has noticed some crusting in the am right greater than left but at times both. She notices as well swelling above the eye as well, not at current.   She does have a productive cough, no fever.        ROS: Negative unless specifically indicated above in HPI.   Relevant past medical history reviewed and updated as indicated.   Allergies and medications reviewed and updated.   Current Outpatient Medications:    acetaminophen (TYLENOL) 325 MG tablet, Take 650 mg by mouth every 6 (six) hours as needed., Disp: , Rfl:    albuterol (VENTOLIN HFA) 108 (90 Base) MCG/ACT inhaler, TAKE 2 PUFFS BY MOUTH EVERY 6 HOURS AS NEEDED FOR WHEEZE OR SHORTNESS OF BREATH, Disp: 8.5 each, Rfl: 0   busPIRone (BUSPAR) 5 MG tablet, TAKE 1 TABLET (5 MG TOTAL) BY MOUTH 2 (TWO) TIMES DAILY FOR ANXIETY., Disp: 180 tablet, Rfl: 1   cefdinir (OMNICEF) 300 MG capsule, Take 1 capsule (300 mg total) by mouth 2 (two) times daily for 10 days., Disp: 20 capsule, Rfl: 0   famotidine (PEPCID) 20 MG tablet, One at bedtime, Disp: 30 tablet, Rfl: 11   losartan (COZAAR) 25 MG tablet, TAKE 1 TABLET BY MOUTH EVERY DAY FOR BLOOD PRESSURE, Disp: 90 tablet, Rfl: 3   meloxicam (MOBIC) 15 MG tablet, Take 1 tablet (15 mg total) by mouth daily as needed for pain., Disp: 90 tablet, Rfl:  0   methocarbamol (ROBAXIN) 500 MG tablet, Take 1 tablet (500 mg total) by mouth every 8 (eight) hours as needed for muscle spasms., Disp: 90 tablet, Rfl: 0   mometasone-formoterol (DULERA) 100-5 MCG/ACT AERO, Inhale 2 puffs into the lungs 2 (two) times daily., Disp: 13 g, Rfl: 5   ofloxacin (OCUFLOX) 0.3 % ophthalmic solution, Place 1 drop into both eyes 4 (four) times daily for 7 days., Disp: 1.4 mL, Rfl: 0   ondansetron (ZOFRAN) 4 MG tablet, Take 1 tablet (4 mg total) by mouth daily as needed., Disp: 30 tablet, Rfl: 1   PARoxetine (PAXIL) 20 MG tablet, TAKE 1 TABLET (20 MG TOTAL) BY MOUTH DAILY. FOR ANXIETY, Disp: 90 tablet, Rfl: 1   rosuvastatin (CRESTOR) 10 MG tablet, TAKE 1 TABLET BY MOUTH EVERY DAY FOR CHOLESTEROL, Disp: 90 tablet, Rfl: 2   SUMAtriptan (IMITREX) 50 MG tablet, TAKE 1 TABLET MY MOUTH AT MIGRAINE ONSET. MAY REPEAT IN 2 HOURS IF HEADACHE PERSISTS OR RECURS., Disp: 10 tablet, Rfl: 5   tirzepatide 5 MG/0.5ML injection vial, Inject 5 mg into the skin once a week., Disp: 6 mL, Rfl: 0   triamcinolone (NASACORT AQ) 55 MCG/ACT nasal inhaler, Place 2 sprays into the nose daily., Disp: 1 Inhaler, Rfl: 0  Allergies  Allergen Reactions  Other Swelling    Bees/Wasps- redness    Objective:   BP 126/86 (BP Location: Left Arm, Patient Position: Sitting, Cuff Size: Large)   Pulse 84   Temp 97.7 F (36.5 C) (Temporal)   Ht 5\' 5"  (1.651 m)   Wt 220 lb (99.8 kg)   SpO2 97%   BMI 36.61 kg/m    Physical Exam Vitals reviewed.  Constitutional:      General: She is not in acute distress.    Appearance: Normal appearance. She is normal weight. She is not ill-appearing, toxic-appearing or diaphoretic.  HENT:     Head: Normocephalic.     Right Ear: Tympanic membrane normal.     Left Ear: Tympanic membrane normal.     Nose: Nose normal.     Mouth/Throat:     Mouth: Mucous membranes are dry.     Pharynx: Posterior oropharyngeal erythema and postnasal drip present. No oropharyngeal  exudate.  Eyes:     General: Vision grossly intact. Gaze aligned appropriately.        Right eye: Discharge (crusty) present.        Left eye: Discharge (crusty) present.    Extraocular Movements: Extraocular movements intact.     Pupils: Pupils are equal, round, and reactive to light.  Cardiovascular:     Rate and Rhythm: Normal rate and regular rhythm.     Pulses: Normal pulses.     Heart sounds: Normal heart sounds.  Pulmonary:     Effort: Pulmonary effort is normal.     Breath sounds: Normal breath sounds.  Musculoskeletal:     Cervical back: Normal range of motion.  Lymphadenopathy:     Cervical: Cervical adenopathy present.     Right cervical: Superficial cervical adenopathy present.     Left cervical: Superficial cervical adenopathy present.  Neurological:     General: No focal deficit present.     Mental Status: She is alert and oriented to person, place, and time. Mental status is at baseline.  Psychiatric:        Mood and Affect: Mood normal.        Behavior: Behavior normal.        Thought Content: Thought content normal.        Judgment: Judgment normal.     Assessment & Plan:  Bacterial upper respiratory infection Assessment & Plan: Take antibiotic as prescribed. Increase oral fluids. Pt to f/u if sx worsen and or fail to improve in 2-3 days.   Orders: -     Cefdinir; Take 1 capsule (300 mg total) by mouth 2 (two) times daily for 10 days.  Dispense: 20 capsule; Refill: 0  Bacterial conjunctivitis Assessment & Plan: rx for ofloxacin sent to pt pharmacy, take as prescribed.  Discussed how to wash eye appropriately with warm warm cloth from inner to outer canthus. Wash bed sheets and pillow cases to prevent re-infection. Frequent handwashing. If no improvement in 24-48 hours with eye drops as prescribed, please call office.    Orders: -     Ofloxacin; Place 1 drop into both eyes 4 (four) times daily for 7 days.  Dispense: 1.4 mL; Refill: 0     Follow up  plan: Return for f/u PCP if no improvement in symptoms.  Mort Sawyers, FNP

## 2023-10-06 NOTE — Assessment & Plan Note (Signed)
rx for ofloxacin sent to pt pharmacy, take as prescribed.  Discussed how to wash eye appropriately with warm warm cloth from inner to outer canthus. Wash bed sheets and pillow cases to prevent re-infection. Frequent handwashing. If no improvement in 24-48 hours with eye drops as prescribed, please call office.   

## 2023-10-06 NOTE — Assessment & Plan Note (Signed)
Take antibiotic as prescribed. Increase oral fluids. Pt to f/u if sx worsen and or fail to improve in 2-3 days.  

## 2023-10-13 MED ORDER — TIRZEPATIDE-WEIGHT MANAGEMENT 5 MG/0.5ML ~~LOC~~ SOAJ: 5.0000 mg | SUBCUTANEOUS | 0 refills | Status: DC

## 2023-10-15 ENCOUNTER — Encounter: Payer: Self-pay | Admitting: Family

## 2023-10-28 ENCOUNTER — Encounter: Payer: Self-pay | Admitting: Emergency Medicine

## 2023-10-28 ENCOUNTER — Emergency Department
Admission: EM | Admit: 2023-10-28 | Discharge: 2023-10-28 | Disposition: A | Discharge status: Home / Self Care | Attending: Emergency Medicine | Admitting: Emergency Medicine

## 2023-10-28 ENCOUNTER — Other Ambulatory Visit: Payer: Self-pay

## 2023-10-28 ENCOUNTER — Emergency Department

## 2023-10-28 DIAGNOSIS — R079 Chest pain, unspecified: Secondary | ICD-10-CM | POA: Diagnosis present

## 2023-10-28 DIAGNOSIS — E876 Hypokalemia: Secondary | ICD-10-CM | POA: Diagnosis not present

## 2023-10-28 DIAGNOSIS — R0789 Other chest pain: Secondary | ICD-10-CM | POA: Insufficient documentation

## 2023-10-28 LAB — BASIC METABOLIC PANEL WITH GFR
Anion gap: 8 (ref 5–15)
BUN: 8 mg/dL (ref 6–20)
CO2: 28 mmol/L (ref 22–32)
Calcium: 9 mg/dL (ref 8.9–10.3)
Chloride: 103 mmol/L (ref 98–111)
Creatinine, Ser: 0.69 mg/dL (ref 0.44–1.00)
GFR, Estimated: >60 mL/min (ref >60)
Glucose, Bld: 94 mg/dL (ref 70–99)
Potassium: 3.3 mmol/L — ABNORMAL LOW (ref 3.5–5.1)
Sodium: 139 mmol/L (ref 135–145)

## 2023-10-28 LAB — TROPONIN I (HIGH SENSITIVITY)
Troponin I (High Sensitivity): 2 ng/L (ref <18)
Troponin I (High Sensitivity): 3 ng/L (ref <18)

## 2023-10-28 LAB — CBC
HCT: 40.7 % (ref 36.0–46.0)
Hemoglobin: 13.7 g/dL (ref 12.0–15.0)
MCH: 29.4 pg (ref 26.0–34.0)
MCHC: 33.7 g/dL (ref 30.0–36.0)
MCV: 87.3 fL (ref 80.0–100.0)
Platelets: 316 10*3/uL (ref 150–400)
RBC: 4.66 MIL/uL (ref 3.87–5.11)
RDW: 14.3 % (ref 11.5–15.5)
WBC: 7.6 10*3/uL (ref 4.0–10.5)
nRBC: 0 % (ref 0.0–0.2)

## 2023-10-28 MED ORDER — POTASSIUM CHLORIDE CRYS ER 20 MEQ PO TBCR
40.0000 meq | EXTENDED_RELEASE_TABLET | Freq: Once | ORAL | Status: AC
  Administered 2023-10-28: 40 meq via ORAL
  Filled 2023-10-28: qty 2

## 2023-10-28 NOTE — ED Triage Notes (Signed)
 [place] [facility] for left side CP that started this AM. Denies cardiac hx but does see Dr Juliann Pares for BP. NAD noted.

## 2023-10-28 NOTE — ED Provider Notes (Signed)
 [facility] Provider Note    Event Date/Time   First MD Initiated Contact with Patient 10/28/23 1350     (approximate)   History   Chest Pain   HPI  Amanda Dixon is a 58 y.o. female who presents to the ED for evaluation of Chest Pain   Patient presents alongside her sister for intermittent left-sided chest pain beneath her left breast chest today.  No coexisting symptoms such as nausea, emesis, cough, shortness of breath   Physical Exam   Triage Vital Signs: ED Triage Vitals  Encounter Vitals Group     BP 10/28/23 1110 (!) 151/64     Systolic BP Percentile --      Diastolic BP Percentile --      Pulse Rate 10/28/23 1110 71     Resp 10/28/23 1110 18     Temp 10/28/23 1110 98.1 F (36.7 C)     Temp Source 10/28/23 1110 Oral     SpO2 10/28/23 1110 95 %     Weight 10/28/23 1111 223 lb (101.2 kg)     Height 10/28/23 1111 5\' 5"  (1.651 m)     Head Circumference --      Peak Flow --      Pain Score 10/28/23 1110 6     Pain Loc --      Pain Education --      Exclude from Growth Chart --     Most recent vital signs: Vitals:   10/28/23 1415 10/28/23 1415  BP: (!) 121/58   Pulse: (!) 59   Resp: 18   Temp:  (!) 97.5 F (36.4 C)  SpO2: 100%     General: Awake, no distress.  CV:  Good peripheral perfusion.  Resp:  Normal effort.  Abd:  No distention.  MSK:  No deformity noted.  Tender to palpation beneath her left breast to the inferior margin of her rib cage, no LUQ abdominal tenderness. Neuro:  No focal deficits appreciated. Other:     ED Results / Procedures / Treatments   Labs (all labs ordered are listed, but only abnormal results are displayed) Labs Reviewed  BASIC METABOLIC PANEL WITH GFR - Abnormal; Notable for the following components:      Result Value   Potassium 3.3 (*)    All other components within normal limits  CBC  TROPONIN I (HIGH SENSITIVITY)  TROPONIN I (HIGH SENSITIVITY)    EKG Sinus rhythm with a rate  of 65 bpm.  Normal axis and intervals.  No clear signs of acute ischemia.  RADIOLOGY CXR interpreted by me without evidence of acute cardiopulmonary pathology.  Official radiology report(s): DG Chest 2 View Result Date: 10/28/2023 CLINICAL DATA:  Chest pain. EXAM: CHEST - 2 VIEW COMPARISON:  Chest radiograph dated 04/22/2023. FINDINGS: The heart size and mediastinal contours are within normal limits. Both lungs are clear. No acute osseous abnormality. IMPRESSION: No acute cardiopulmonary findings. Electronically Signed   By: Hart Robinsons M.D.   On: 10/28/2023 13:00    PROCEDURES and INTERVENTIONS:  .1-3 Lead EKG Interpretation  Performed by: Delton Prairie, MD Authorized by: Delton Prairie, MD     Interpretation: normal     ECG rate:  61   ECG rate assessment: normal     Rhythm: sinus rhythm     Ectopy: none     Conduction: normal     Medications  potassium chloride SA (KLOR-CON M) CR tablet 40 mEq (has no administration in time range)  IMPRESSION / MDM / ASSESSMENT AND PLAN / ED COURSE  I reviewed the triage vital signs and the nursing notes.  Differential diagnosis includes, but is not limited to, ACS, PTX, PNA, muscle strain/spasm, PE, dissection, anxiety, pleural effusion  {Patient presents with symptoms of an acute illness or injury that is potentially life-threatening.  Moderate risk patient presents with atypical pain with a benign workup and suitable for outpatient management.  Nonischemic EKG and 2 negative troponins.  Mildly low potassium was replaced orally and possibly related to her pain.  Normal CBC, clear CXR.  She is fairly low risk.  Possibly muscular due to her low potassium.  Discussed PCP follow-up and return precautions.      FINAL CLINICAL IMPRESSION(S) / ED DIAGNOSES   Final diagnoses:  Other chest pain  Hypokalemia     Rx / DC Orders   ED Discharge Orders     None        Note:  This document was prepared using Dragon voice  recognition software and may include unintentional dictation errors.   Delton Prairie, MD 10/28/23 [phone]

## 2023-10-28 NOTE — ED Triage Notes (Signed)
 Patient arrived by [facility] from fire station that patient had driven to from home. C/o sudden onset of intermittent stabbing chest pain under left breast. Took her breath away. Worsens with movement  History pleurisy  324mg  aspirin given by EMS  EMS vitals: 148/92 b/p 94HR 97% RA

## 2023-11-04 ENCOUNTER — Encounter: Payer: Self-pay | Admitting: Primary Care

## 2023-11-04 ENCOUNTER — Ambulatory Visit: Payer: 59 | Admitting: Primary Care

## 2023-11-04 VITALS — BP 136/82 | HR 85 | Temp 98.6°F | Ht 65.0 in | Wt 220.0 lb

## 2023-11-04 DIAGNOSIS — G43709 Chronic migraine without aura, not intractable, without status migrainosus: Secondary | ICD-10-CM

## 2023-11-04 DIAGNOSIS — E66812 Obesity, class 2: Secondary | ICD-10-CM | POA: Diagnosis not present

## 2023-11-04 DIAGNOSIS — R5382 Chronic fatigue, unspecified: Secondary | ICD-10-CM | POA: Diagnosis not present

## 2023-11-04 DIAGNOSIS — R7303 Prediabetes: Secondary | ICD-10-CM

## 2023-11-04 DIAGNOSIS — R079 Chest pain, unspecified: Secondary | ICD-10-CM

## 2023-11-04 DIAGNOSIS — Z6836 Body mass index (BMI) 36.0-36.9, adult: Secondary | ICD-10-CM

## 2023-11-04 DIAGNOSIS — G4730 Sleep apnea, unspecified: Secondary | ICD-10-CM

## 2023-11-04 LAB — TSH: TSH: 3.71 u[IU]/mL (ref 0.35–5.50)

## 2023-11-04 LAB — IBC + FERRITIN
Ferritin: 33.1 ng/mL (ref 10.0–291.0)
Iron: 64 ug/dL (ref 42–145)
Saturation Ratios: 17.9 % — ABNORMAL LOW (ref 20.0–50.0)
TIBC: 357 ug/dL (ref 250.0–450.0)
Transferrin: 255 mg/dL (ref 212.0–360.0)

## 2023-11-04 LAB — HEMOGLOBIN A1C: Hgb A1c MFr Bld: 5.6 % (ref 4.6–6.5)

## 2023-11-04 LAB — VITAMIN D 25 HYDROXY (VIT D DEFICIENCY, FRACTURES): VITD: 25.09 ng/mL — ABNORMAL LOW (ref 30.00–100.00)

## 2023-11-04 LAB — VITAMIN B12: Vitamin B-12: 247 pg/mL (ref 211–911)

## 2023-11-04 MED ORDER — TIRZEPATIDE-WEIGHT MANAGEMENT 7.5 MG/0.5ML ~~LOC~~ SOAJ: 7.5000 mg | SUBCUTANEOUS | 0 refills | Status: DC

## 2023-11-04 MED ORDER — SUMATRIPTAN SUCCINATE 50 MG PO TABS: ORAL_TABLET | ORAL | 0 refills | Status: AC

## 2023-11-04 NOTE — Assessment & Plan Note (Signed)
 Weight has reached a plateau.   Increase Zepbound back up to 7.5 mg weekly. She agrees. Close follow up regarding side effects. She will update.  Labs pending today

## 2023-11-04 NOTE — Assessment & Plan Note (Signed)
 Discussed importance of nightly compliance.

## 2023-11-04 NOTE — Progress Notes (Signed)
 Subjective:    Patient ID: Amanda Dixon, female    DOB: [birth year], 58 y.o.   MRN: [id number]  HPI  Amanda Dixon is a very pleasant 58 y.o. female with a history of hypertension, severe sleep apnea, morbid obesity, prediabetes, chronic back pain who presents today for follow-up of obesity and ED follow up.  She is also needing a refill of her Imitrex.  1) Class 2 Obesity: Currently managed on Zepbound 5 mg weekly for which she originally began in January 2025. She was up to 7.5 mg weekly but experienced side effects lf nausea and vomiting. She questions if she was sick from something else and not the Zepbound.   She's doing well on the 5 mg dose of Zepbound. She would like to try the 7.5 mg dose of Zepbound.   Wt Readings from Last 3 Encounters:  11/04/23 220 lb (99.8 kg)  10/28/23 223 lb (101.2 kg)  10/06/23 220 lb (99.8 kg)    2) Chest Pain: She presented to [facility] ED on 10/28/23 for intermittent, left sided chest pain that began that day. She underwent labs which showed mild hypokalemia. Her Troponin levels x 2 were negative. ECG without acute changes. Chest xray was negative. She was discharged home later that day.  Today she denies chest pain, but she feels tired, fatigued, and overall physically tired. She experiences exertional shortness of breath walking up stairs.   She feels that her asthma and anxiety/depression are well controlled. She is using her albuterol inhaler infrequently. She has a family history of heart disease in her father including heart attack with sudden death.   She follows with cardiology through [facility], has undergone stress test and echocardiogram. Has an appointment with cardiologist for later this week. She has sleep apnea, does not use every night.   BP Readings from Last 3 Encounters:  11/04/23 136/82  10/28/23 (!) 121/58  10/06/23 126/86   Body mass index is 36.61 kg/m.    Review of Systems  Constitutional:  Positive for fatigue.   Respiratory:  Positive for shortness of breath. Negative for cough.   Cardiovascular:  Negative for chest pain.  Psychiatric/Behavioral:  The patient is not nervous/anxious.          Past Medical History:  Diagnosis Date   Abnormal craving 09/19/2022   Acute midline low back pain with bilateral sciatica 07/17/2022   Anemia    Anxiety    Asthma    Back pain    Bilateral swelling of feet    Depression    Epistaxis 01/24/2022   Gallbladder problem    GERD (gastroesophageal reflux disease)    High cholesterol    History of swelling of feet    Hypertension    IBS (irritable bowel syndrome)    Joint pain    Obesity    Sinus trouble    Sleep apnea    SOB (shortness of breath)    Vitamin B12 deficiency    Vitamin D deficiency     Social History   Socioeconomic History   Marital status: Married    Spouse name: Not on file   Number of children: Not on file   Years of education: Not on file   Highest education level: Not on file  Occupational History   Occupation: Research scientist (medical)  Tobacco Use   Smoking status: Never    Passive exposure: Past   Smokeless tobacco: Never   Tobacco comments:    NEVER SMOKER:  EXPOSED TO 2ND(HUSBAND)  X SEVERAL YEARS.   Vaping Use   Vaping status: Never Used  Substance and Sexual Activity   Alcohol use: No   Drug use: No   Sexual activity: Yes    Partners: Male    Birth control/protection: Surgical    Comment: Hysterectomy   Other Topics Concern   Not on file  Social History Narrative   Not on file   Social Drivers of Health   Financial Resource Strain: Not on file  Food Insecurity: Not on file  Transportation Needs: Not on file  Physical Activity: Not on file  Stress: Not on file  Social Connections: Not on file  Intimate Partner Violence: Not on file    Past Surgical History:  Procedure Laterality Date   ABDOMINAL HYSTERECTOMY  2008   supracervical   CESAREAN SECTION  1990   CHOLECYSTECTOMY  1996/97?    TONSILLECTOMY      Family History  Problem Relation Age of Onset   Allergies Mother    High Cholesterol Mother    Obesity Mother    Heart disease Father        multpile bypass   Hypertension Father    High Cholesterol Father    Sudden death Father    Obesity Father    Heart attack Father    Allergies Sister    Diabetes Sister    Asthma Sister    Eczema Sister    Cancer - Cervical Sister    Endometriosis Sister    Heart disease Maternal Grandfather    Emphysema Maternal Grandfather    Asthma Maternal Grandfather    Emphysema Paternal Grandmother    Diabetes Paternal Grandmother    Heart disease Paternal Grandfather    Allergies Son    Asthma Maternal Aunt    Diabetes Paternal Aunt    Diabetes Paternal Uncle    Allergies Other        mothers side   Asthma Other        father side   Breast cancer Neg Hx     Allergies  Allergen Reactions   Other Swelling    Bees/Wasps- redness    Current Outpatient Medications on File Prior to Visit  Medication Sig Dispense Refill   acetaminophen (TYLENOL) 325 MG tablet Take 650 mg by mouth every 6 (six) hours as needed.     albuterol (VENTOLIN HFA) 108 (90 Base) MCG/ACT inhaler TAKE 2 PUFFS BY MOUTH EVERY 6 HOURS AS NEEDED FOR WHEEZE OR SHORTNESS OF BREATH 8.5 each 0   busPIRone (BUSPAR) 5 MG tablet TAKE 1 TABLET (5 MG TOTAL) BY MOUTH 2 (TWO) TIMES DAILY FOR ANXIETY. 180 tablet 1   cholecalciferol (VITAMIN D3) 25 MCG (1000 UNIT) tablet Take 1,000 Units by mouth daily.     famotidine (PEPCID) 20 MG tablet One at bedtime 30 tablet 11   losartan (COZAAR) 25 MG tablet TAKE 1 TABLET BY MOUTH EVERY DAY FOR BLOOD PRESSURE 90 tablet 3   meloxicam (MOBIC) 15 MG tablet Take 1 tablet (15 mg total) by mouth daily as needed for pain. 90 tablet 0   methocarbamol (ROBAXIN) 500 MG tablet Take 1 tablet (500 mg total) by mouth every 8 (eight) hours as needed for muscle spasms. 90 tablet 0   MIEBO 1.338 GM/ML SOLN      mometasone-formoterol (DULERA)  100-5 MCG/ACT AERO Inhale 2 puffs into the lungs 2 (two) times daily. 13 g 5   PARoxetine (PAXIL) 20 MG tablet TAKE 1 TABLET (20 MG TOTAL) BY MOUTH  DAILY. FOR ANXIETY 90 tablet 1   rosuvastatin (CRESTOR) 10 MG tablet TAKE 1 TABLET BY MOUTH EVERY DAY FOR CHOLESTEROL 90 tablet 2   triamcinolone (NASACORT AQ) 55 MCG/ACT nasal inhaler Place 2 sprays into the nose daily. 1 Inhaler 0   ondansetron (ZOFRAN) 4 MG tablet Take 1 tablet (4 mg total) by mouth daily as needed. (Patient not taking: Reported on 11/04/2023) 30 tablet 1   No current facility-administered medications on file prior to visit.    BP 136/82   Pulse 85   Temp 98.6 F (37 C) (Oral)   Ht 5\' 5"  (1.651 m)   Wt 220 lb (99.8 kg)   SpO2 96%   BMI 36.61 kg/m  Objective:   Physical Exam Cardiovascular:     Rate and Rhythm: Normal rate and regular rhythm.  Pulmonary:     Effort: Pulmonary effort is normal.     Breath sounds: Normal breath sounds.  Musculoskeletal:     Cervical back: Neck supple.  Skin:    General: Skin is warm and dry.  Neurological:     Mental Status: She is alert and oriented to person, place, and time.  Psychiatric:        Mood and Affect: Mood normal.           Assessment & Plan:  Chronic fatigue Assessment & Plan: Labs pending today. Reviewed cardiology work up from 2023 through [facility] from [place].  Continue to work on weight loss.   Orders: -     Vitamin B12 -     VITAMIN D 25 Hydroxy (Vit-D Deficiency, Fractures) -     IBC + Ferritin -     TSH  Chronic migraine without aura without status migrainosus, not intractable -     SUMAtriptan Succinate; Take 1 tablet my mouth at migraine onset. May repeat in 2 hours if headache persists or recurs.  Dispense: 10 tablet; Refill: 0  Prediabetes Assessment & Plan: Repeat A1C pending.  Orders: -     Hemoglobin A1c  Class 2 severe obesity due to excess calories with serious comorbidity and body mass index (BMI) of 36.0 to 36.9 in adult  [facility]) Assessment & Plan: Weight has reached a plateau.   Increase Zepbound back up to 7.5 mg weekly. She agrees. Close follow up regarding side effects. She will update.  Labs pending today  Orders: -     Tirzepatide-Weight Management; Inject 7.5 mg into the skin once a week.  Dispense: 2 mL; Refill: 0  Severe sleep apnea Assessment & Plan: Discussed importance of nightly compliance.    Chest pain, unspecified type Assessment & Plan: With recent ED visit. ED notes, labs, imaging reviewed.  Follow up with cardiology as scheduled. Reviewed prior cardiac work up from [facility], NP

## 2023-11-04 NOTE — Patient Instructions (Signed)
 Stop by the lab prior to leaving today. I will notify you of your results once received.   We increased your Zepbound to 7.5 mg weekly. Let me know how this goes.  It was a pleasure to see you today!

## 2023-11-04 NOTE — Assessment & Plan Note (Signed)
 Repeat A1C pending.

## 2023-11-04 NOTE — Assessment & Plan Note (Signed)
 Labs pending today. Reviewed cardiology work up from 2023 through [facility] from [place].  Continue to work on weight loss.

## 2023-11-04 NOTE — Assessment & Plan Note (Signed)
 With recent ED visit. ED notes, labs, imaging reviewed.  Follow up with cardiology as scheduled. Reviewed prior cardiac work up from [facility]

## 2023-11-06 ENCOUNTER — Other Ambulatory Visit: Payer: Self-pay | Admitting: Internal Medicine

## 2023-11-06 DIAGNOSIS — R0789 Other chest pain: Secondary | ICD-10-CM

## 2023-11-20 ENCOUNTER — Telehealth (HOSPITAL_COMMUNITY): Payer: Self-pay | Admitting: *Deleted

## 2023-11-20 NOTE — Telephone Encounter (Signed)
 Attempted to call patient regarding upcoming cardiac CT appointment. Left message on voicemail with name and callback number  Larey Brick RN Navigator Cardiac Imaging [facility] [phone] Office [phone] Cell

## 2023-11-24 ENCOUNTER — Ambulatory Visit
Admission: RE | Admit: 2023-11-24 | Discharge: 2023-11-24 | Disposition: A | Discharge status: Home / Self Care | Source: Ambulatory Visit | Attending: Internal Medicine | Admitting: Internal Medicine

## 2023-11-24 DIAGNOSIS — R0789 Other chest pain: Secondary | ICD-10-CM | POA: Diagnosis present

## 2023-11-24 MED ORDER — DILTIAZEM HCL 25 MG/5ML IV SOLN
INTRAVENOUS | Status: AC
  Filled 2023-11-24: qty 5

## 2023-11-24 MED ORDER — DILTIAZEM HCL 25 MG/5ML IV SOLN
10.0000 mg | INTRAVENOUS | Status: DC | PRN
  Administered 2023-11-24: 10 mg via INTRAVENOUS
  Filled 2023-11-24 (×2): qty 5

## 2023-11-24 MED ORDER — IOHEXOL 350 MG/ML SOLN: 80.0000 mL | Freq: Once | INTRAVENOUS | Status: DC | PRN

## 2023-11-24 MED ORDER — NITROGLYCERIN 0.4 MG SL SUBL
0.8000 mg | SUBLINGUAL_TABLET | Freq: Once | SUBLINGUAL | Status: DC
  Filled 2023-11-24: qty 25

## 2023-11-24 NOTE — Progress Notes (Signed)
 Pt came in for CT Coronary. Pt states she did not take any medications prior to arrival. Pt came in with a heart rate in the upper 70s-80s. Pt states she has a history of asthma. Per protocol, IV cardizem given and HR did come down, but Blood pressure also dropped to less than 110 systolic. Please see vitals for more information.  Dr. Junnie Olives notified and stated not to proceed and reschedule patient with 100mg  of metoprolol and 10mg  of ivabradine. Cardiac navigators notified via secure chat. CT team also notified. Pt provided ice water and walked out without complications. This RN walked with pt from [facility]-bay to the entrance of the medical mall.

## 2023-12-02 ENCOUNTER — Encounter (HOSPITAL_COMMUNITY): Payer: Self-pay

## 2023-12-02 ENCOUNTER — Ambulatory Visit (HOSPITAL_COMMUNITY): Admission: EM | Admit: 2023-12-02 | Discharge: 2023-12-02 | Disposition: A | Discharge status: Home / Self Care

## 2023-12-02 DIAGNOSIS — S40021A Contusion of right upper arm, initial encounter: Secondary | ICD-10-CM | POA: Insufficient documentation

## 2023-12-02 LAB — CBC WITH DIFFERENTIAL/PLATELET
Abs Immature Granulocytes: 0.09 10*3/uL — ABNORMAL HIGH (ref 0.00–0.07)
Basophils Absolute: 0.1 10*3/uL (ref 0.0–0.1)
Basophils Relative: 1 %
Eosinophils Absolute: 0.1 10*3/uL (ref 0.0–0.5)
Eosinophils Relative: 1 %
HCT: 41.1 % (ref 36.0–46.0)
Hemoglobin: 14 g/dL (ref 12.0–15.0)
Immature Granulocytes: 1 %
Lymphocytes Relative: 33 %
Lymphs Abs: 3.8 10*3/uL (ref 0.7–4.0)
MCH: 29.4 pg (ref 26.0–34.0)
MCHC: 34.1 g/dL (ref 30.0–36.0)
MCV: 86.3 fL (ref 80.0–100.0)
Monocytes Absolute: 0.7 10*3/uL (ref 0.1–1.0)
Monocytes Relative: 6 %
Neutro Abs: 6.7 10*3/uL (ref 1.7–7.7)
Neutrophils Relative %: 58 %
Platelets: 345 10*3/uL (ref 150–400)
RBC: 4.76 MIL/uL (ref 3.87–5.11)
RDW: 13.8 % (ref 11.5–15.5)
WBC: 11.4 10*3/uL — ABNORMAL HIGH (ref 4.0–10.5)
nRBC: 0 % (ref 0.0–0.2)

## 2023-12-02 LAB — COMPREHENSIVE METABOLIC PANEL WITH GFR
ALT: 17 U/L (ref 0–44)
AST: 24 U/L (ref 15–41)
Albumin: 3.5 g/dL (ref 3.5–5.0)
Alkaline Phosphatase: 48 U/L (ref 38–126)
Anion gap: 10 (ref 5–15)
BUN: 5 mg/dL — ABNORMAL LOW (ref 6–20)
CO2: 30 mmol/L (ref 22–32)
Calcium: 9.6 mg/dL (ref 8.9–10.3)
Chloride: 99 mmol/L (ref 98–111)
Creatinine, Ser: 0.74 mg/dL (ref 0.44–1.00)
GFR, Estimated: >60 mL/min (ref >60)
Glucose, Bld: 97 mg/dL (ref 70–99)
Potassium: 2.9 mmol/L — ABNORMAL LOW (ref 3.5–5.1)
Sodium: 139 mmol/L (ref 135–145)
Total Bilirubin: 0.6 mg/dL (ref 0.0–1.2)
Total Protein: 7.3 g/dL (ref 6.5–8.1)

## 2023-12-02 NOTE — ED Triage Notes (Addendum)
 Pt present with rt forearm pain and a bruise/knot to the forearm. States it is hot the touch. Pt states she has been losing weight. Has been feeling like she is stumbling, felt It was caused because of stress.

## 2023-12-02 NOTE — ED Provider Notes (Signed)
 [facility] [place]  Note:  This document was prepared using Dragon voice recognition software and may include unintentional dictation errors.  MRN: [id number] DOB: 1966/03/24  Subjective:   Amanda Dixon is a 58 y.o. female presenting for right forearm hematoma with bruising and pain that started earlier today.  Patient does not remember any specific injury to the area.  Patient states that she does bruise very easily.  Patient states that some of her coworkers made her concerned that she might have a blood clot and advised her to follow-up to be evaluated.  Patient states that she would like blood count collected to see if her platelets are abnormally low or if there are any other abnormalities.  Patient reports that she has a history of anemia and abnormal bruising.  No current facility-administered medications for this encounter.  Current Outpatient Medications:    acetaminophen  (TYLENOL ) 325 MG tablet, Take 650 mg by mouth every 6 (six) hours as needed., Disp: , Rfl:    albuterol  (VENTOLIN  HFA) 108 (90 Base) MCG/ACT inhaler, TAKE 2 PUFFS BY MOUTH EVERY 6 HOURS AS NEEDED FOR WHEEZE OR SHORTNESS OF BREATH, Disp: 8.5 each, Rfl: 0   busPIRone  (BUSPAR ) 5 MG tablet, TAKE 1 TABLET (5 MG TOTAL) BY MOUTH 2 (TWO) TIMES DAILY FOR ANXIETY., Disp: 180 tablet, Rfl: 1   cholecalciferol (VITAMIN D3) 25 MCG (1000 UNIT) tablet, Take 1,000 Units by mouth daily., Disp: , Rfl:    famotidine  (PEPCID ) 20 MG tablet, One at bedtime, Disp: 30 tablet, Rfl: 11   losartan  (COZAAR ) 25 MG tablet, TAKE 1 TABLET BY MOUTH EVERY DAY FOR BLOOD PRESSURE, Disp: 90 tablet, Rfl: 3   meloxicam  (MOBIC ) 15 MG tablet, Take 1 tablet (15 mg total) by mouth daily as needed for pain., Disp: 90 tablet, Rfl: 0   methocarbamol  (ROBAXIN ) 500 MG tablet, Take 1 tablet (500 mg total) by mouth every 8 (eight) hours as needed for muscle spasms., Disp: 90 tablet, Rfl: 0   MIEBO 1.338 GM/ML SOLN, , Disp: , Rfl:    mometasone-formoterol   (DULERA) 100-5 MCG/ACT AERO, Inhale 2 puffs into the lungs 2 (two) times daily., Disp: 13 g, Rfl: 5   ondansetron  (ZOFRAN ) 4 MG tablet, Take 1 tablet (4 mg total) by mouth daily as needed. (Patient not taking: Reported on 11/04/2023), Disp: 30 tablet, Rfl: 1   PARoxetine  (PAXIL ) 20 MG tablet, TAKE 1 TABLET (20 MG TOTAL) BY MOUTH DAILY. FOR ANXIETY, Disp: 90 tablet, Rfl: 1   rosuvastatin  (CRESTOR ) 10 MG tablet, TAKE 1 TABLET BY MOUTH EVERY DAY FOR CHOLESTEROL, Disp: 90 tablet, Rfl: 2   SUMAtriptan  (IMITREX ) 50 MG tablet, Take 1 tablet my mouth at migraine onset. May repeat in 2 hours if headache persists or recurs., Disp: 10 tablet, Rfl: 0   tirzepatide  (ZEPBOUND ) 7.5 MG/0.5ML Pen, Inject 7.5 mg into the skin once a week., Disp: 2 mL, Rfl: 0   triamcinolone  (NASACORT  AQ) 55 MCG/ACT nasal inhaler, Place 2 sprays into the nose daily., Disp: 1 Inhaler, Rfl: 0   Allergies  Allergen Reactions   Other Swelling    Bees/Wasps- redness    Past Medical History:  Diagnosis Date   Abnormal craving 09/19/2022   Acute midline low back pain with bilateral sciatica 07/17/2022   Anemia    Anxiety    Asthma    Back pain    Bilateral swelling of feet    Depression    Epistaxis 01/24/2022   Gallbladder problem    GERD (gastroesophageal reflux disease)  High cholesterol    History of swelling of feet    Hypertension    IBS (irritable bowel syndrome)    Joint pain    Obesity    Sinus trouble    Sleep apnea    SOB (shortness of breath)    Vitamin B12 deficiency    Vitamin D  deficiency      Past Surgical History:  Procedure Laterality Date   ABDOMINAL HYSTERECTOMY  2008   supracervical   CESAREAN SECTION  1990   CHOLECYSTECTOMY  1996/97?   TONSILLECTOMY      Family History  Problem Relation Age of Onset   Allergies Mother    High Cholesterol Mother    Obesity Mother    Heart disease Father        multpile bypass   Hypertension Father    High Cholesterol Father    Sudden death Father     Obesity Father    Heart attack Father    Allergies Sister    Diabetes Sister    Asthma Sister    Eczema Sister    Cancer - Cervical Sister    Endometriosis Sister    Heart disease Maternal Grandfather    Emphysema Maternal Grandfather    Asthma Maternal Grandfather    Emphysema Paternal Grandmother    Diabetes Paternal Grandmother    Heart disease Paternal Grandfather    Allergies Son    Asthma Maternal Aunt    Diabetes Paternal Aunt    Diabetes Paternal Uncle    Allergies Other        mothers side   Asthma Other        father side   Breast cancer Neg Hx     Social History   Tobacco Use   Smoking status: Never    Passive exposure: Past   Smokeless tobacco: Never   Tobacco comments:    NEVER SMOKER:  EXPOSED TO 2ND(HUSBAND) X SEVERAL YEARS.   Vaping Use   Vaping status: Never Used  Substance Use Topics   Alcohol use: No   Drug use: No    ROS Refer to HPI for ROS details.  Objective:   Vitals: Pulse 81   Temp 98.2 F (36.8 C) (Oral)   Resp 18   SpO2 95%   Physical Exam Vitals and nursing note reviewed.  Constitutional:      General: She is not in acute distress.    Appearance: She is well-developed. She is not ill-appearing or toxic-appearing.  HENT:     Head: Normocephalic and atraumatic.  Cardiovascular:     Rate and Rhythm: Normal rate.  Pulmonary:     Effort: Pulmonary effort is normal. No respiratory distress.  Skin:    General: Skin is warm and dry.     Findings: Bruising (Moderately sized ecchymosis noted to right forearm approximately 5 cm in diameter, no surrounding erythema or swelling length, 3 cm hardened indurated area to the center bruising signifying hematoma.) present. No erythema, rash or wound.  Neurological:     General: No focal deficit present.     Mental Status: She is alert and oriented to person, place, and time.  Psychiatric:        Mood and Affect: Mood normal.        Behavior: Behavior normal.     Procedures  No  results found for this or any previous visit (from the past 24 hours).  No results found.   Assessment and Plan :     Discharge  Instructions       1. Hematoma of arm, right, initial encounter (Primary) - Comprehensive metabolic panel collected in UC and sent to lab for further testing results should be available in 1 to 2 days - CBC with Differential collected in UC and sent to lab for further testing results should be available in 1 to 2 days. - If laboratory testing is abnormal we will contact you and appropriate guidance will be provided - Follow-up with your primary care provider on Thursday as scheduled for further evaluation and discussion of laboratory testing. -Continue to monitor symptoms for any change in severity if there is any escalation of current symptoms or development of new symptoms follow-up in ER for further evaluation and management.      Melchizedek Espinola B Dontreal Miera   Amanda Dixon, [place], [place] 12/02/23 1956

## 2023-12-02 NOTE — Discharge Instructions (Addendum)
  1. Hematoma of arm, right, initial encounter (Primary) - Comprehensive metabolic panel collected in UC and sent to lab for further testing results should be available in 1 to 2 days - CBC with Differential collected in UC and sent to lab for further testing results should be available in 1 to 2 days. - If laboratory testing is abnormal we will contact you and appropriate guidance will be provided - Follow-up with your primary care provider on Thursday as scheduled for further evaluation and discussion of laboratory testing. -Continue to monitor symptoms for any change in severity if there is any escalation of current symptoms or development of new symptoms follow-up in ER for further evaluation and management.

## 2023-12-03 ENCOUNTER — Emergency Department (HOSPITAL_COMMUNITY): Admission: EM | Admit: 2023-12-03 | Discharge: 2023-12-03 | Disposition: A | Discharge status: Home / Self Care

## 2023-12-03 ENCOUNTER — Other Ambulatory Visit: Payer: Self-pay

## 2023-12-03 ENCOUNTER — Emergency Department (HOSPITAL_COMMUNITY)

## 2023-12-03 ENCOUNTER — Encounter (HOSPITAL_COMMUNITY): Payer: Self-pay

## 2023-12-03 ENCOUNTER — Ambulatory Visit: Payer: Self-pay

## 2023-12-03 DIAGNOSIS — X58XXXA Exposure to other specified factors, initial encounter: Secondary | ICD-10-CM | POA: Insufficient documentation

## 2023-12-03 DIAGNOSIS — Z79899 Other long term (current) drug therapy: Secondary | ICD-10-CM | POA: Diagnosis not present

## 2023-12-03 DIAGNOSIS — S5011XA Contusion of right forearm, initial encounter: Secondary | ICD-10-CM | POA: Diagnosis not present

## 2023-12-03 DIAGNOSIS — R5383 Other fatigue: Secondary | ICD-10-CM | POA: Diagnosis present

## 2023-12-03 DIAGNOSIS — S40021A Contusion of right upper arm, initial encounter: Secondary | ICD-10-CM

## 2023-12-03 DIAGNOSIS — E876 Hypokalemia: Secondary | ICD-10-CM | POA: Diagnosis not present

## 2023-12-03 LAB — BASIC METABOLIC PANEL WITH GFR
Anion gap: 10 (ref 5–15)
BUN: 6 mg/dL (ref 6–20)
CO2: 30 mmol/L (ref 22–32)
Calcium: 9.5 mg/dL (ref 8.9–10.3)
Chloride: 101 mmol/L (ref 98–111)
Creatinine, Ser: 0.81 mg/dL (ref 0.44–1.00)
GFR, Estimated: >60 mL/min (ref >60)
Glucose, Bld: 111 mg/dL — ABNORMAL HIGH (ref 70–99)
Potassium: 3 mmol/L — ABNORMAL LOW (ref 3.5–5.1)
Sodium: 141 mmol/L (ref 135–145)

## 2023-12-03 LAB — CBC
HCT: 43.2 % (ref 36.0–46.0)
Hemoglobin: 14.2 g/dL (ref 12.0–15.0)
MCH: 29.7 pg (ref 26.0–34.0)
MCHC: 32.9 g/dL (ref 30.0–36.0)
MCV: 90.4 fL (ref 80.0–100.0)
Platelets: 301 10*3/uL (ref 150–400)
RBC: 4.78 MIL/uL (ref 3.87–5.11)
RDW: 13.7 % (ref 11.5–15.5)
WBC: 12 10*3/uL — ABNORMAL HIGH (ref 4.0–10.5)
nRBC: 0 % (ref 0.0–0.2)

## 2023-12-03 LAB — TROPONIN I (HIGH SENSITIVITY)
Troponin I (High Sensitivity): 4 ng/L (ref <18)
Troponin I (High Sensitivity): 5 ng/L (ref <18)

## 2023-12-03 LAB — MAGNESIUM: Magnesium: 1.9 mg/dL (ref 1.7–2.4)

## 2023-12-03 LAB — TSH: TSH: 3.718 u[IU]/mL (ref 0.350–4.500)

## 2023-12-03 MED ORDER — POTASSIUM CHLORIDE CRYS ER 20 MEQ PO TBCR
40.0000 meq | EXTENDED_RELEASE_TABLET | Freq: Once | ORAL | Status: AC
  Administered 2023-12-03: 40 meq via ORAL
  Filled 2023-12-03: qty 2

## 2023-12-03 MED ORDER — POTASSIUM CHLORIDE CRYS ER 20 MEQ PO TBCR: 20.0000 meq | EXTENDED_RELEASE_TABLET | Freq: Two times a day (BID) | ORAL | 0 refills | Status: DC

## 2023-12-03 NOTE — Telephone Encounter (Signed)
 Patient called in asking if she does need to go to ED, based off of UC recommendations of low potassium. Patient wasn't seeking triage and has already been triaged at [facility] - this RN explained danger and importance of treatment of low potassium. Patient states she will go to ED today for evaluation/replacement of potassium as directed by UC. No further needs at this time.   Copied from CRM [id number]. Topic: Clinical - Red Word Triage >> Dec 03, 2023 12:34 PM Chuck Crater wrote: Reason for CRM: Patient went to the urgent care because she wasn't feeling well. After test were ran she did have abnormal results. She was advised to got to the ER to get fluid. Patient doesn't want to sit in ER If she can get an oral for it. Patient feels dizzy, her blood pressure, and sugar is low. *over the course of this week

## 2023-12-03 NOTE — Telephone Encounter (Signed)
 Noted. She also has an appointment scheduled with me for 05/08.

## 2023-12-03 NOTE — Discharge Instructions (Signed)
 Your workup today was reassuring.  Please take the potassium supplements as prescribed.  Follow-up with your doctor for reevaluation.  Return to the ER for worsening symptoms.  I have placed an order for an ultrasound to be performed as an outpatient.  Please call [phone] tomorrow morning if you do not hear anything about scheduling this.  Return to the ER for worsening symptoms.

## 2023-12-03 NOTE — ED Provider Notes (Signed)
 [place] EMERGENCY DEPARTMENT AT [facility] Provider Note   CSN: [id number] Arrival date & time: 12/03/23  1410     History  Chief Complaint  Patient presents with   Hypertension   abnormal labs    Amanda Dixon is a 58 y.o. female.  58 year old female with past medical history of hypertension hyperlipidemia presenting to the emergency department today with fatigue and generalized weakness.  The patient states this been going now for the past few weeks.  States that she has been having some episodes of nausea and diaphoresis that come along with this.  She has followed up with cardiology and they are planning some further workup as an outpatient.  The patient went to urgent care yesterday for some bruising on her right arm.  She had labs checked and apparently her potassium was very low.  She states that she was seen a few weeks ago in [place] in the emergency department and was treated with oral potassium.  She never had any issues prior to the last few weeks.  She states that she has occasional lightheadedness as well as some fatigue.  She does report bruising more easily than normal.  Is not on any blood thinners.  She denies any chest pain or shortness of breath currently.   Hypertension       Home Medications Prior to Admission medications   Medication Sig Start Date End Date Taking? Authorizing Provider  potassium chloride  SA (KLOR-CON  M) 20 MEQ tablet Take 1 tablet (20 mEq total) by mouth 2 (two) times daily. 12/03/23  Yes Carin Charleston, MD  acetaminophen  (TYLENOL ) 325 MG tablet Take 650 mg by mouth every 6 (six) hours as needed.    [provider]  albuterol  (VENTOLIN  HFA) 108 (90 Base) MCG/ACT inhaler TAKE 2 PUFFS BY MOUTH EVERY 6 HOURS AS NEEDED FOR WHEEZE OR SHORTNESS OF BREATH 05/28/23   Clark, Katherine K, NP  busPIRone  (BUSPAR ) 5 MG tablet TAKE 1 TABLET (5 MG TOTAL) BY MOUTH 2 (TWO) TIMES DAILY FOR ANXIETY. 07/31/22   Clark, Katherine K, NP   cholecalciferol (VITAMIN D3) 25 MCG (1000 UNIT) tablet Take 1,000 Units by mouth daily.    [provider]  famotidine  (PEPCID ) 20 MG tablet One at bedtime 03/04/18   Wert, Michael B, MD  losartan  (COZAAR ) 25 MG tablet TAKE 1 TABLET BY MOUTH EVERY DAY FOR BLOOD PRESSURE 08/11/23   Clark, Katherine K, NP  meloxicam  (MOBIC ) 15 MG tablet Take 1 tablet (15 mg total) by mouth daily as needed for pain. 08/06/22   Clark, Katherine K, NP  methocarbamol  (ROBAXIN ) 500 MG tablet Take 1 tablet (500 mg total) by mouth every 8 (eight) hours as needed for muscle spasms. 08/06/22   Gabriel John, NP  MIEBO 1.338 GM/ML SOLN  10/26/23   [provider]  mometasone-formoterol  (DULERA) 100-5 MCG/ACT AERO Inhale 2 puffs into the lungs 2 (two) times daily. 01/24/22   Clark, Katherine K, NP  ondansetron  (ZOFRAN ) 4 MG tablet Take 1 tablet (4 mg total) by mouth daily as needed. Patient not taking: Reported on 11/04/2023 04/22/23 04/21/24  Buell Carmin, MD  PARoxetine  (PAXIL ) 20 MG tablet TAKE 1 TABLET (20 MG TOTAL) BY MOUTH DAILY. FOR ANXIETY 07/27/23   Clark, Katherine K, NP  rosuvastatin  (CRESTOR ) 10 MG tablet TAKE 1 TABLET BY MOUTH EVERY DAY FOR CHOLESTEROL 09/03/23   Clark, Katherine K, NP  SUMAtriptan  (IMITREX ) 50 MG tablet Take 1 tablet my mouth at migraine onset. May  repeat in 2 hours if headache persists or recurs. 11/04/23   Clark, Katherine K, NP  tirzepatide  (ZEPBOUND ) 7.5 MG/0.5ML Pen Inject 7.5 mg into the skin once a week. 11/04/23   Clark, Katherine K, NP  triamcinolone  (NASACORT  AQ) 55 MCG/ACT nasal inhaler Place 2 sprays into the nose daily. 02/22/13   Marine Sia, MD      Allergies    Other    Review of Systems   Review of Systems  Constitutional:  Positive for fatigue.  Hematological:  Bruises/bleeds easily.  All other systems reviewed and are negative.   Physical Exam Updated Vital Signs BP 126/70 (BP Location: Right Arm)   Pulse 68   Temp 98.5 F (36.9 C) (Oral)   Resp 18   Ht  5\' 5"  (1.651 m)   Wt 93 kg   SpO2 99%   BMI 34.11 kg/m  Physical Exam Vitals and nursing note reviewed.   Gen: NAD Eyes: PERRL, EOMI HEENT: no oropharyngeal swelling Neck: trachea midline Resp: clear to auscultation bilaterally Card: RRR, no murmurs, rubs, or gallops Abd: nontender, nondistended Extremities: no calf tenderness, no edema Vascular: 2+ radial pulses bilaterally, 2+ DP pulses bilaterally Skin: Bruising noted to the right forearm with soft compartments Psyc: acting appropriately   ED Results / Procedures / Treatments   Labs (all labs ordered are listed, but only abnormal results are displayed) Labs Reviewed  BASIC METABOLIC PANEL WITH GFR - Abnormal; Notable for the following components:      Result Value   Potassium 3.0 (*)    Glucose, Bld 111 (*)    All other components within normal limits  CBC - Abnormal; Notable for the following components:   WBC 12.0 (*)    All other components within normal limits  TSH  MAGNESIUM  URINALYSIS, ROUTINE W REFLEX MICROSCOPIC  TROPONIN I (HIGH SENSITIVITY)  TROPONIN I (HIGH SENSITIVITY)    EKG None  Radiology DG Chest Portable 1 View Result Date: 12/03/2023 CLINICAL DATA:  Chest pain. EXAM: PORTABLE CHEST 1 VIEW COMPARISON:  10/28/2023. FINDINGS: Bilateral lung fields are clear. Bilateral costophrenic angles are clear. Normal cardio-mediastinal silhouette. No acute osseous abnormalities. The soft tissues are within normal limits. IMPRESSION: No active disease. Electronically Signed   By: Beula Brunswick M.D.   On: 12/03/2023 16:37    Procedures Procedures    Medications Ordered in ED Medications  potassium chloride  SA (KLOR-CON  M) CR tablet 40 mEq (40 mEq Oral Given 12/03/23 2042)    ED Course/ Medical Decision Making/ A&P                                 Medical Decision Making 58 year old female with past medical history of hypertension hyperlipidemia presenting to the emergency department today with fatigue  and easy bruising.  I will further evaluate patient here with basic labs as well as a magnesium level given the recurrent hypokalemia.  Will further evaluate her here with an EKG and chest x-ray as well to evaluate for atypical ACS given the nausea and diaphoresis.  She does have some bruising noted to the right upper extremity.  Will obtain an ultrasound to evaluate for DVT.  She does have a follow-up appoint with her primary care provider tomorrow.  Will reevaluate for ultimate disposition.  The patient labs are reassuring.  EKG and troponins are negative.  Ultrasound is not available this evening.  I did perform a limited bedside ultrasound  this does seem more consistent with hematoma.  Will have a formal study done tomorrow but hold off on anticoagulation since the patient is already been having issues with bruising.  She does have follow-up with her primary care tomorrow.  Is given potassium supplementation.  She will be discharged with return precautions.  Amount and/or Complexity of Data Reviewed Labs: ordered. Radiology: ordered.  Risk Prescription drug management.          Final Clinical Impression(s) / ED Diagnoses Final diagnoses:  Hypokalemia  Contusion of right upper extremity, initial encounter    Rx / DC Orders ED Discharge Orders          Ordered    potassium chloride  SA (KLOR-CON  M) 20 MEQ tablet  2 times daily        12/03/23 2106    UE VENOUS DUPLEX        12/03/23 2106              Carin Charleston, MD 12/03/23 2108

## 2023-12-03 NOTE — ED Triage Notes (Signed)
 Pt states she feels unwell and has HTN even with meds. Pt states she is losing weight and brusing easily. C/O CP a couple of weeks today but not today. Pt C/O weakness.

## 2023-12-04 ENCOUNTER — Ambulatory Visit (HOSPITAL_COMMUNITY)
Admission: RE | Admit: 2023-12-04 | Discharge: 2023-12-04 | Disposition: A | Discharge status: Home / Self Care | Source: Ambulatory Visit

## 2023-12-04 ENCOUNTER — Ambulatory Visit (INDEPENDENT_AMBULATORY_CARE_PROVIDER_SITE_OTHER): Admitting: Primary Care

## 2023-12-04 ENCOUNTER — Encounter: Payer: Self-pay | Admitting: Primary Care

## 2023-12-04 VITALS — BP 108/66 | HR 79 | Temp 97.5°F | Ht 65.0 in | Wt 210.0 lb

## 2023-12-04 DIAGNOSIS — I10 Essential (primary) hypertension: Secondary | ICD-10-CM | POA: Diagnosis not present

## 2023-12-04 DIAGNOSIS — E876 Hypokalemia: Secondary | ICD-10-CM | POA: Insufficient documentation

## 2023-12-04 DIAGNOSIS — Z6834 Body mass index (BMI) 34.0-34.9, adult: Secondary | ICD-10-CM

## 2023-12-04 DIAGNOSIS — E66811 Obesity, class 1: Secondary | ICD-10-CM | POA: Diagnosis not present

## 2023-12-04 DIAGNOSIS — R5382 Chronic fatigue, unspecified: Secondary | ICD-10-CM

## 2023-12-04 DIAGNOSIS — F411 Generalized anxiety disorder: Secondary | ICD-10-CM

## 2023-12-04 DIAGNOSIS — E6609 Other obesity due to excess calories: Secondary | ICD-10-CM

## 2023-12-04 DIAGNOSIS — S40021A Contusion of right upper arm, initial encounter: Secondary | ICD-10-CM | POA: Insufficient documentation

## 2023-12-04 NOTE — Assessment & Plan Note (Signed)
 Ongoing, progressing.  We discussed potential causes which include the tremendous amount of stress over the last 1-2 years.  Will hold losartan  25 mg daily for now. She will update in 2 weeks.  Start potassium chloride  20 mEq BID x 7 days. Repeat potassium in 2 weeks and again at 4 weeks.

## 2023-12-04 NOTE — Assessment & Plan Note (Signed)
 With recent UC and ED visits.  Start potassium chloride  20 mgE BID x 7 days. Repeat potassium in 2 weeks and again in 4 weeks.

## 2023-12-04 NOTE — Progress Notes (Signed)
 Subjective:    Patient ID: Amanda Dixon, female    DOB: [birth year], 58 y.o.   MRN: [id number]  HPI  Amanda Dixon is a very pleasant 58 y.o. female with a history of chronic migraines, hypertension, asthma, sleep apnea, GAD, peripheral edema, chronic fatigue, hyperlipidemia who presents today for ED follow-up.  Evaluated at [facility] on 10/28/2023 for intermittent left-sided chest pain without other symptoms.  She was noted to be hypertensive.  Chest x-ray, EKG, serial troponins were negative.  Potassium was mildly low so she was replenished with oral supplementation.  Evaluated urgent care on 12/02/2023 for hematoma with bruising.  She requested lab work which revealed a potassium of 2.9.  She was told to go to the ED.  Evaluated yesterday at [facility] ED for hypertension and hypokalemia.  EKG and serial troponins were negative.  Bedside ultrasound revealed a hematoma to the right upper extremity.  She was treated for hypokalemia with potassium chloride  prescription 20 mEq BID x 7 days.  Today she is compliant to her potassium chloride . She has noticed symptoms of dizziness and sweats when changing positions and with certain movements. She is managed on losartan  25 mg daily.   BP Readings from Last 3 Encounters:  12/04/23 108/66  12/03/23 126/70  11/24/23 (!) 105/55   Wt Readings from Last 3 Encounters:  12/04/23 210 lb (95.3 kg)  12/03/23 205 lb (93 kg)  11/04/23 220 lb (99.8 kg)      Review of Systems  Respiratory:  Negative for shortness of breath.   Cardiovascular:  Negative for chest pain.  Neurological:  Positive for dizziness, light-headedness and headaches.  Hematological:  Bruises/bleeds easily.         Past Medical History:  Diagnosis Date   Abnormal craving 09/19/2022   Acute midline low back pain with bilateral sciatica 07/17/2022   Anemia    Anxiety    Asthma    Back pain    Bilateral swelling of feet    Depression    Epistaxis 01/24/2022   Gallbladder problem     GERD (gastroesophageal reflux disease)    High cholesterol    History of swelling of feet    Hypertension    IBS (irritable bowel syndrome)    Joint pain    Obesity    Sinus trouble    Sleep apnea    SOB (shortness of breath)    Vitamin B12 deficiency    Vitamin D  deficiency     Social History   Socioeconomic History   Marital status: Married    Spouse name: Not on file   Number of children: Not on file   Years of education: Not on file   Highest education level: Not on file  Occupational History   Occupation: Research scientist (medical)  Tobacco Use   Smoking status: Never    Passive exposure: Past   Smokeless tobacco: Never   Tobacco comments:    NEVER SMOKER:  EXPOSED TO 2ND(HUSBAND) X SEVERAL YEARS.   Vaping Use   Vaping status: Never Used  Substance and Sexual Activity   Alcohol use: No   Drug use: No   Sexual activity: Yes    Partners: Male    Birth control/protection: Surgical    Comment: Hysterectomy   Other Topics Concern   Not on file  Social History Narrative   Not on file   Social Drivers of Health   Financial Resource Strain: Not on file  Food Insecurity: Not on file  Transportation Needs:  Not on file  Physical Activity: Not on file  Stress: Not on file  Social Connections: Not on file  Intimate Partner Violence: Not on file    Past Surgical History:  Procedure Laterality Date   ABDOMINAL HYSTERECTOMY  2008   supracervical   CESAREAN SECTION  1990   CHOLECYSTECTOMY  1996/97?   TONSILLECTOMY      Family History  Problem Relation Age of Onset   Allergies Mother    High Cholesterol Mother    Obesity Mother    Heart disease Father        multpile bypass   Hypertension Father    High Cholesterol Father    Sudden death Father    Obesity Father    Heart attack Father    Allergies Sister    Diabetes Sister    Asthma Sister    Eczema Sister    Cancer - Cervical Sister    Endometriosis Sister    Heart disease Maternal Grandfather     Emphysema Maternal Grandfather    Asthma Maternal Grandfather    Emphysema Paternal Grandmother    Diabetes Paternal Grandmother    Heart disease Paternal Grandfather    Allergies Son    Asthma Maternal Aunt    Diabetes Paternal Aunt    Diabetes Paternal Uncle    Allergies Other        mothers side   Asthma Other        father side   Breast cancer Neg Hx     Allergies  Allergen Reactions   Other Swelling    Bees/Wasps- redness    Current Outpatient Medications on File Prior to Visit  Medication Sig Dispense Refill   acetaminophen  (TYLENOL ) 325 MG tablet Take 650 mg by mouth every 6 (six) hours as needed.     albuterol  (VENTOLIN  HFA) 108 (90 Base) MCG/ACT inhaler TAKE 2 PUFFS BY MOUTH EVERY 6 HOURS AS NEEDED FOR WHEEZE OR SHORTNESS OF BREATH 8.5 each 0   busPIRone  (BUSPAR ) 5 MG tablet TAKE 1 TABLET (5 MG TOTAL) BY MOUTH 2 (TWO) TIMES DAILY FOR ANXIETY. 180 tablet 1   famotidine  (PEPCID ) 20 MG tablet One at bedtime 30 tablet 11   losartan  (COZAAR ) 25 MG tablet TAKE 1 TABLET BY MOUTH EVERY DAY FOR BLOOD PRESSURE 90 tablet 3   MIEBO 1.338 GM/ML SOLN      mometasone-formoterol  (DULERA) 100-5 MCG/ACT AERO Inhale 2 puffs into the lungs 2 (two) times daily. 13 g 5   PARoxetine  (PAXIL ) 20 MG tablet TAKE 1 TABLET (20 MG TOTAL) BY MOUTH DAILY. FOR ANXIETY 90 tablet 1   potassium chloride  SA (KLOR-CON  M) 20 MEQ tablet Take 1 tablet (20 mEq total) by mouth 2 (two) times daily. 14 tablet 0   rosuvastatin  (CRESTOR ) 10 MG tablet TAKE 1 TABLET BY MOUTH EVERY DAY FOR CHOLESTEROL 90 tablet 2   SUMAtriptan  (IMITREX ) 50 MG tablet Take 1 tablet my mouth at migraine onset. May repeat in 2 hours if headache persists or recurs. 10 tablet 0   tirzepatide  (ZEPBOUND ) 7.5 MG/0.5ML Pen Inject 7.5 mg into the skin once a week. 2 mL 0   triamcinolone  (NASACORT  AQ) 55 MCG/ACT nasal inhaler Place 2 sprays into the nose daily. 1 Inhaler 0   cholecalciferol (VITAMIN D3) 25 MCG (1000 UNIT) tablet Take 1,000 Units by  mouth daily. (Patient not taking: Reported on 12/04/2023)     No current facility-administered medications on file prior to visit.    BP 108/66   Pulse  79   Temp (!) 97.5 F (36.4 C) (Temporal)   Ht 5\' 5"  (1.651 m)   Wt 210 lb (95.3 kg)   SpO2 98%   BMI 34.95 kg/m  Objective:   Physical Exam Cardiovascular:     Rate and Rhythm: Normal rate and regular rhythm.  Pulmonary:     Effort: Pulmonary effort is normal.     Breath sounds: Normal breath sounds.  Musculoskeletal:     Cervical back: Neck supple.  Skin:    General: Skin is warm and dry.     Findings: Bruising present.     Comments: Dark purple bruising to her right anterior lower forearm   Neurological:     Mental Status: She is alert and oriented to person, place, and time.  Psychiatric:        Mood and Affect: Mood normal.           Assessment & Plan:  Primary hypertension Assessment & Plan: Stop losartan  25 mg due to lower BP readings and symptoms. She will monitor BP at home.    Class 1 obesity due to excess calories with serious comorbidity and body mass index (BMI) of 34.0 to 34.9 in adult Assessment & Plan: Improving!  Continue Zepbound  7.5 mg weekly for now. Consider discontinuation if symptoms continue to persist.    Chronic fatigue Assessment & Plan: Ongoing, progressing.  We discussed potential causes which include the tremendous amount of stress over the last 1-2 years.  Will hold losartan  25 mg daily for now. She will update in 2 weeks.  Start potassium chloride  20 mEq BID x 7 days. Repeat potassium in 2 weeks and again at 4 weeks.   Hypokalemia Assessment & Plan: With recent UC and ED visits.  Start potassium chloride  20 mgE BID x 7 days. Repeat potassium in 2 weeks and again in 4 weeks.    GAD (generalized anxiety disorder) Assessment & Plan: Uncontrolled.  Offered to increase Paxil  she declines. She is taking buspirone  inconsistently, discussed to try more consistent  dosing.   She will try this and update.          Shatyra Becka K Telecia Larocque, NP

## 2023-12-04 NOTE — Assessment & Plan Note (Signed)
 Stop losartan  25 mg due to lower BP readings and symptoms. She will monitor BP at home.

## 2023-12-04 NOTE — Patient Instructions (Signed)
 Schedule a lab only appointment for 2 weeks and 4 weeks from now.  Start the potassium pills now.  Hold the losartan  blood pressure pill for 2 weeks. Watch your blood pressure for me. Notify me if you see increased readings.   It was a pleasure to see you today!

## 2023-12-04 NOTE — Assessment & Plan Note (Signed)
 Improving!  Continue Zepbound  7.5 mg weekly for now. Consider discontinuation if symptoms continue to persist.

## 2023-12-04 NOTE — Assessment & Plan Note (Signed)
 Uncontrolled.  Offered to increase Paxil  she declines. She is taking buspirone  inconsistently, discussed to try more consistent dosing.   She will try this and update.

## 2023-12-05 ENCOUNTER — Other Ambulatory Visit: Payer: Self-pay | Admitting: Primary Care

## 2023-12-05 DIAGNOSIS — E876 Hypokalemia: Secondary | ICD-10-CM

## 2023-12-11 ENCOUNTER — Ambulatory Visit

## 2023-12-17 ENCOUNTER — Telehealth (HOSPITAL_COMMUNITY): Payer: Self-pay | Admitting: *Deleted

## 2023-12-17 ENCOUNTER — Telehealth: Payer: Self-pay | Admitting: Pharmacy Technician

## 2023-12-17 ENCOUNTER — Other Ambulatory Visit (HOSPITAL_COMMUNITY): Payer: Self-pay | Admitting: *Deleted

## 2023-12-17 MED ORDER — IVABRADINE HCL 5 MG PO TABS: ORAL_TABLET | ORAL | 0 refills | Status: DC

## 2023-12-17 MED ORDER — METOPROLOL TARTRATE 100 MG PO TABS: ORAL_TABLET | ORAL | 0 refills | Status: DC

## 2023-12-17 NOTE — Telephone Encounter (Signed)
Patient returning call about her upcoming cardiac imaging study; pt verbalizes understanding of appt date/time, parking situation and where to check in, pre-test NPO status and medications ordered, and verified current allergies; name and call back number provided for further questions should they arise  Adolphus Hanf RN Navigator Cardiac Imaging [facility] [phone] office [phone] cell  Patient to take 100mg metoprolol tartrate and 10mg ivabradine two hours prior to her cardiac CT scan. 

## 2023-12-17 NOTE — Telephone Encounter (Signed)
 Attempted to call patient regarding upcoming cardiac CT appointment. Left message on voicemail with name and callback number  Larey Brick RN Navigator Cardiac Imaging [facility] [phone] Office [phone] Cell

## 2023-12-17 NOTE — Telephone Encounter (Signed)
 Received notification to do prior auth for ivabradine 5mg  for qty 2 tablets. Per her insurance no prior authorization can be done for qty of 2 tablets. I called [facility] and it is filled now on discount card [id number]

## 2023-12-18 ENCOUNTER — Ambulatory Visit
Admission: RE | Admit: 2023-12-18 | Discharge: 2023-12-18 | Disposition: A | Discharge status: Home / Self Care | Source: Ambulatory Visit | Attending: Internal Medicine | Admitting: Internal Medicine

## 2023-12-18 ENCOUNTER — Ambulatory Visit: Payer: Self-pay | Admitting: Primary Care

## 2023-12-18 ENCOUNTER — Other Ambulatory Visit (INDEPENDENT_AMBULATORY_CARE_PROVIDER_SITE_OTHER)

## 2023-12-18 ENCOUNTER — Other Ambulatory Visit: Payer: Self-pay | Admitting: Primary Care

## 2023-12-18 DIAGNOSIS — E876 Hypokalemia: Secondary | ICD-10-CM | POA: Diagnosis not present

## 2023-12-18 DIAGNOSIS — R0789 Other chest pain: Secondary | ICD-10-CM | POA: Insufficient documentation

## 2023-12-18 LAB — POTASSIUM: Potassium: 4.5 meq/L (ref 3.5–5.1)

## 2023-12-18 MED ORDER — METOPROLOL TARTRATE 5 MG/5ML IV SOLN: 10.0000 mg | Freq: Once | INTRAVENOUS | Status: DC | PRN

## 2023-12-18 MED ORDER — IOHEXOL 350 MG/ML SOLN
80.0000 mL | Freq: Once | INTRAVENOUS | Status: AC | PRN
  Administered 2023-12-18: 80 mL via INTRAVENOUS

## 2023-12-18 MED ORDER — DILTIAZEM HCL 25 MG/5ML IV SOLN: 10.0000 mg | INTRAVENOUS | Status: DC | PRN

## 2023-12-18 MED ORDER — NITROGLYCERIN 0.4 MG SL SUBL
0.8000 mg | SUBLINGUAL_TABLET | Freq: Once | SUBLINGUAL | Status: AC
  Administered 2023-12-18: 0.4 mg via SUBLINGUAL

## 2023-12-18 MED ORDER — NITROGLYCERIN 0.4 MG SL SUBL
SUBLINGUAL_TABLET | SUBLINGUAL | Status: AC
Start: 2023-12-18 — End: ?
  Filled 2023-12-18: qty 2

## 2023-12-18 MED ORDER — NITROGLYCERIN 0.4 MG SL SUBL
SUBLINGUAL_TABLET | SUBLINGUAL | Status: AC
  Filled 2023-12-18: qty 1

## 2023-12-18 NOTE — Progress Notes (Signed)
Patient tolerated procedure well. W/C to lobby.  Ambulate w/o difficulty. Denies light headedness or being dizzy. Encouraged to drink extra water today and reasoning explained. Verbalized understanding. All questions answered. ABC intact. No further needs. Discharge from procedure area w/o issues.

## 2023-12-21 DIAGNOSIS — I10 Essential (primary) hypertension: Secondary | ICD-10-CM

## 2023-12-21 DIAGNOSIS — R7303 Prediabetes: Secondary | ICD-10-CM

## 2023-12-21 DIAGNOSIS — E6609 Other obesity due to excess calories: Secondary | ICD-10-CM

## 2023-12-23 MED ORDER — TIRZEPATIDE-WEIGHT MANAGEMENT 10 MG/0.5ML ~~LOC~~ SOAJ: 10.0000 mg | SUBCUTANEOUS | 0 refills | Status: DC

## 2024-01-01 ENCOUNTER — Ambulatory Visit: Payer: Self-pay | Admitting: Primary Care

## 2024-01-01 ENCOUNTER — Other Ambulatory Visit (INDEPENDENT_AMBULATORY_CARE_PROVIDER_SITE_OTHER)

## 2024-01-01 DIAGNOSIS — E876 Hypokalemia: Secondary | ICD-10-CM

## 2024-01-01 LAB — POTASSIUM: Potassium: 4 meq/L (ref 3.5–5.1)

## 2024-01-21 ENCOUNTER — Other Ambulatory Visit: Payer: Self-pay | Admitting: Primary Care

## 2024-01-21 DIAGNOSIS — E6609 Other obesity due to excess calories: Secondary | ICD-10-CM

## 2024-01-21 DIAGNOSIS — R7303 Prediabetes: Secondary | ICD-10-CM

## 2024-01-21 DIAGNOSIS — I10 Essential (primary) hypertension: Secondary | ICD-10-CM

## 2024-02-03 ENCOUNTER — Other Ambulatory Visit: Payer: Self-pay | Admitting: Primary Care

## 2024-02-03 DIAGNOSIS — F411 Generalized anxiety disorder: Secondary | ICD-10-CM

## 2024-03-15 ENCOUNTER — Emergency Department
Admission: EM | Admit: 2024-03-15 | Discharge: 2024-03-15 | Disposition: A | Discharge status: Home / Self Care | Attending: Emergency Medicine | Admitting: Emergency Medicine

## 2024-03-15 ENCOUNTER — Emergency Department

## 2024-03-15 ENCOUNTER — Ambulatory Visit: Payer: Self-pay | Admitting: *Deleted

## 2024-03-15 ENCOUNTER — Other Ambulatory Visit: Payer: Self-pay

## 2024-03-15 DIAGNOSIS — J0130 Acute sphenoidal sinusitis, unspecified: Secondary | ICD-10-CM | POA: Diagnosis not present

## 2024-03-15 DIAGNOSIS — S0083XA Contusion of other part of head, initial encounter: Secondary | ICD-10-CM | POA: Insufficient documentation

## 2024-03-15 DIAGNOSIS — S80211A Abrasion, right knee, initial encounter: Secondary | ICD-10-CM | POA: Insufficient documentation

## 2024-03-15 DIAGNOSIS — J45909 Unspecified asthma, uncomplicated: Secondary | ICD-10-CM | POA: Insufficient documentation

## 2024-03-15 DIAGNOSIS — G43009 Migraine without aura, not intractable, without status migrainosus: Secondary | ICD-10-CM | POA: Insufficient documentation

## 2024-03-15 DIAGNOSIS — S1083XA Contusion of other specified part of neck, initial encounter: Secondary | ICD-10-CM | POA: Insufficient documentation

## 2024-03-15 DIAGNOSIS — Y990 Civilian activity done for income or pay: Secondary | ICD-10-CM | POA: Insufficient documentation

## 2024-03-15 DIAGNOSIS — I10 Essential (primary) hypertension: Secondary | ICD-10-CM | POA: Insufficient documentation

## 2024-03-15 DIAGNOSIS — W19XXXA Unspecified fall, initial encounter: Secondary | ICD-10-CM | POA: Diagnosis not present

## 2024-03-15 DIAGNOSIS — S0093XA Contusion of unspecified part of head, initial encounter: Secondary | ICD-10-CM

## 2024-03-15 DIAGNOSIS — G43001 Migraine without aura, not intractable, with status migrainosus: Secondary | ICD-10-CM

## 2024-03-15 DIAGNOSIS — S0990XA Unspecified injury of head, initial encounter: Secondary | ICD-10-CM | POA: Diagnosis present

## 2024-03-15 LAB — CBC
HCT: 40.1 % (ref 36.0–46.0)
Hemoglobin: 13.4 g/dL (ref 12.0–15.0)
MCH: 30.4 pg (ref 26.0–34.0)
MCHC: 33.4 g/dL (ref 30.0–36.0)
MCV: 90.9 fL (ref 80.0–100.0)
Platelets: 338 K/uL (ref 150–400)
RBC: 4.41 MIL/uL (ref 3.87–5.11)
RDW: 14.4 % (ref 11.5–15.5)
WBC: 10.3 K/uL (ref 4.0–10.5)
nRBC: 0 % (ref 0.0–0.2)

## 2024-03-15 LAB — BASIC METABOLIC PANEL WITH GFR
Anion gap: 10 (ref 5–15)
BUN: 17 mg/dL (ref 6–20)
CO2: 24 mmol/L (ref 22–32)
Calcium: 9.5 mg/dL (ref 8.9–10.3)
Chloride: 104 mmol/L (ref 98–111)
Creatinine, Ser: 0.66 mg/dL (ref 0.44–1.00)
GFR, Estimated: >60 mL/min (ref >60)
Glucose, Bld: 96 mg/dL (ref 70–99)
Potassium: 3.9 mmol/L (ref 3.5–5.1)
Sodium: 138 mmol/L (ref 135–145)

## 2024-03-15 LAB — TROPONIN I (HIGH SENSITIVITY): Troponin I (High Sensitivity): 3 ng/L (ref <18)

## 2024-03-15 MED ORDER — FLUCONAZOLE 150 MG PO TABS: 150.0000 mg | ORAL_TABLET | Freq: Every day | ORAL | 0 refills | Status: DC

## 2024-03-15 MED ORDER — PREDNISONE 10 MG PO TABS: ORAL_TABLET | ORAL | 0 refills | Status: DC

## 2024-03-15 MED ORDER — AMOXICILLIN-POT CLAVULANATE 875-125 MG PO TABS: 1.0000 | ORAL_TABLET | Freq: Two times a day (BID) | ORAL | 0 refills | Status: DC

## 2024-03-15 NOTE — ED Triage Notes (Signed)
 Pt sts that she has been having a migraine since early this AM. Pt sts that she was able to go to work, however pt sts that while she was at work she got really dizzy and fell over. Pt sts that she has tension in the back left of her head. Pt sts that she has been taken off her BP meds due to losing 50 lbs. Pt ambulatory in triage with no assistance needed.

## 2024-03-15 NOTE — ED Provider Notes (Signed)
 [facility] Provider Note    Event Date/Time   First MD Initiated Contact with Patient 03/15/24 1328     (approximate)   History   Migraine   HPI  Amanda Dixon is a 58 y.o. female   presents to the ED with complaint of left-sided headache that began last evening.  Patient states that she woke during the night and continued to have her headache.  Normally she takes Imitrex  but was unable to get her medication.  She was able to get return to sleep but again woke with continued headache.  She states that she got ready for work and after being at work for some time decided that she would go home because of her headache.  It was during this time that she either had a syncopal episode or got up and tripped.  She is not certain but remembers that she did hit her head and also has an abrasion to her right knee.  She states that she has had dizziness with this headache which is not her normal headache.  She believes this is either tension in the back of her neck or because of her blood pressure.  Currently patient does not take blood pressure medication as she lost 50 pounds.  In addition to her history of migraines she also has a history of hypertension, prediabetes, asthma, anxiety disorder, chronic fatigue, depression, chronic back pain, vitamin D  deficiency and hypokalemia.      Physical Exam   Triage Vital Signs: ED Triage Vitals  Encounter Vitals Group     BP 03/15/24 1300 (!) 161/90     Girls Systolic BP Percentile --      Girls Diastolic BP Percentile --      Boys Systolic BP Percentile --      Boys Diastolic BP Percentile --      Pulse Rate 03/15/24 1300 65     Resp 03/15/24 1300 17     Temp 03/15/24 1300 97.9 F (36.6 C)     Temp Source 03/15/24 1300 Oral     SpO2 03/15/24 1300 98 %     Weight 03/15/24 1301 207 lb (93.9 kg)     Height 03/15/24 1301 5' 5 (1.651 m)     Head Circumference --      Peak Flow --      Pain Score 03/15/24 1300 8      Pain Loc --      Pain Education --      Exclude from Growth Chart --     Most recent vital signs: Vitals:   03/15/24 1300 03/15/24 1645  BP: (!) 161/90 (!) 148/88  Pulse: 65 68  Resp: 17 18  Temp: 97.9 F (36.6 C) 98 F (36.7 C)  SpO2: 98% 98%     General: Awake, no distress.  Alert, talkative, answers questions appropriately. CV:  Good peripheral perfusion.  Heart regular rate rhythm. Resp:  Normal effort.  Lungs are clear bilaterally. Abd:  No distention.  Other:  PERRLA, EOMI's, speech is normal, cranial nerves II through XII grossly intact.  Patient is able to stand and ambulate without any assistance.  Good muscle strength bilaterally.  Patient is able move upper and lower extremities without any difficulty.  There is very superficial abrasion over the right patella but no effusion noted.   ED Results / Procedures / Treatments   Labs (all labs ordered are listed, but only abnormal results are displayed) Labs Reviewed  BASIC METABOLIC  PANEL WITH GFR  CBC  TROPONIN I (HIGH SENSITIVITY)  TROPONIN I (HIGH SENSITIVITY)     EKG  Vent. rate 64 BPM PR interval 152 ms QRS duration 78 ms QT/QTcB 398/410 ms P-R-T axes 49 59 50 Normal sinus rhythm Normal ECG When compared with ECG of 03-Dec-2023 14:44, Nonspecific T wave abnormality no longer evident in Lateral leads   RADIOLOGY CT head and cervical spine per radiology  DEGENERATIVE CHANGES:  Arthrosis at multiple levels most pronounced on the left at C2-3. There are  additional small disc osteophyte complexes at multiple levels.  Disc arthrosis and uncovertebral hypertrophy at multiple levels.  Foraminal stenosis most pronounced on the left at C2-3 and C3-4.    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    IMPRESSION:  1. No acute  intracranial abnormality.  2. No acute fracture or traumatic malalignment of the cervical spine.  3. Air fluid level in the left sphenoid sinus. Recommend correlation for acute  sinusitis.    PROCEDURES:  Critical Care performed:   Procedures   MEDICATIONS ORDERED IN ED: Medications - No data to display   IMPRESSION / MDM / ASSESSMENT AND PLAN / ED COURSE  I reviewed the triage vital signs and the nursing notes.   Differential diagnosis includes, but is not limited to, migraine, left-sided headache, syncopal episode, muscle contraction headache, sinusitis considered, contusion head, cervical strain, abrasion right knee, contusion right knee.  58 year old female presents to the ED with complaint of left-sided headache that started this morning.  Patient did not take her Imitrex  as she states it made her sleepy and she had planned on going to work.  While at work she decided that she would leave and work from home when she either fell or had a syncopal episode.  Lab work was reassuring including a troponin x 2.  CT head and cervical spine as noted above.  In the event that part of her headache could be related to a sinusitis we discussed treating with an antibiotic and steroids as her headache initially and continues to be more left-sided.  Patient was ambulating while in the ED without any assistance.  Patient reported that she has Imitrex  at home which she can take once she is there.  A prescription for Augmentin  and prednisone  was sent to the pharmacy along with a Diflucan  if needed.  She has to follow-up with her PCP if any continued problems.  She also is aware that she should return to the emergency department if any severe worsening of her symptoms or urgent concerns.      Patient's presentation is most consistent with acute illness / injury with system symptoms.  FINAL CLINICAL IMPRESSION(S) / ED DIAGNOSES   Final diagnoses:  Acute non-recurrent sphenoidal sinusitis  Migraine  without aura and with status migrainosus, not intractable  Contusion of head and neck region  Fall, initial encounter     Rx / DC Orders   ED Discharge Orders          Ordered    amoxicillin -clavulanate (AUGMENTIN ) 875-125 MG tablet  2 times daily        03/15/24 1635    predniSONE  (DELTASONE ) 10 MG tablet        03/15/24 1635    fluconazole  (DIFLUCAN ) 150 MG tablet  Daily  03/15/24 1635             Note:  This document was prepared using Dragon voice recognition software and may include unintentional dictation errors.   Saunders Shona CROME, PA-C 03/15/24 1746    Arlander Charleston, MD 03/17/24 1537

## 2024-03-15 NOTE — Telephone Encounter (Signed)
 Please have patient scheduled with any [place] provider.

## 2024-03-15 NOTE — ED Notes (Signed)
 States she is just sore from the fall  States her headache is easing off some

## 2024-03-15 NOTE — Telephone Encounter (Signed)
 FYI Only or Action Required?: Action required by provider: request for appointment.  Patient was last seen in primary care on 12/04/2023 by Amanda Comer POUR, NP.  Called Nurse Triage reporting Fall.  Symptoms began today.  Interventions attempted: Nothing.  Symptoms are: gradually worsening.  Triage Disposition: See HCP Within 4 Hours (Or PCP Triage)  Patient/caregiver understands and will follow disposition?: No, wishes to speak with PCP               Copied from CRM #[id number]. Topic: Clinical - Red Word Triage >> Mar 15, 2024  9:55 AM Amanda Dixon wrote: Red Word that prompted transfer to Nurse Triage:  She fell 10-15 minutes ago at work. She hurt her neck. She did have a migraine last and this morning. She did not take her migraine medications. She does not know if she passed out. Reason for Disposition  [1] MODERATE weakness (e.g., interferes with work, school, normal activities) AND [2] new-onset or getting worse  Answer Assessment - Initial Assessment Questions No available appt with PCP today . Recommended patient go to ED for evaluation due to fall at work. Patient declined only wants to see PCP. Reports she had sx of migraine last night but did not take imitrex  due to how late it was and she needed to go to work today. Med knocks me out. Attempted to contact [facility] to inform declined ED and requesting appt with PCP, put on hold for extended time and unable to wait. Patient requesting call back today .  Wants to go to any [facility] in her location.          1. MECHANISM: How did the fall happen?     Stood up and fell at work not sure if from dizziness 2. DOMESTIC VIOLENCE AND ELDER ABUSE SCREENING: Did you fall because someone pushed you or tried to hurt you? If Yes, ask: Are you safe now?     Na  3. ONSET: When did the fall happen? (e.g., minutes, hours, or days ago)     Today 10-15 minutes 4. LOCATION: What part of the body hit the ground? (e.g., back,  buttocks, head, hips, knees, hands, head, stomach)     Right knee carpet burn and bruise 5. INJURY: Did you hurt (injure) yourself when you fell? If Yes, ask: What did you injure? Tell me more about this? (e.g., body area; type of injury; pain severity)     Not sure neck pain possible from headache/ migraine right knee pain 6. PAIN: Is there any pain? If Yes, ask: How bad is the pain? (e.g., Scale 0-10; or none, mild,      Headache 6-7 /10, knee 3/10  7. SIZE: For cuts, bruises, or swelling, ask: How large is it? (e.g., inches or centimeters)      Right knee bruising carpet burn  8. PREGNANCY: Is there any chance you are pregnant? When was your last menstrual period?     Na  9. OTHER SYMPTOMS: Do you have any other symptoms? (e.g., dizziness, fever, weakness; new-onset or worsening).      Headache/migraine since last night , neck pain now, right knee and ankle pain from fall 10. CAUSE: What do you think caused the fall (or falling)? (e.g., dizzy spell, tripped)       Dizziness , not sure why fell due to standing and noted was on the floor  Protocols used: Falls and [facility]

## 2024-03-15 NOTE — Telephone Encounter (Signed)
 Called  pt and schedule a appt

## 2024-03-15 NOTE — ED Notes (Signed)
 See triage note  Presents with headache  States she has had this headache for a couple of days  States she went to work today  and became dizzy  Clemens from chair Unsure if she passed out or just fell   Abrasion noted to right knee   States headache is on the left side of head  Feels like tension

## 2024-03-15 NOTE — Discharge Instructions (Signed)
 Follow-up with your primary care provider if any continued problems or concerns.  You may also take your Imitrex  when you get home.  A prescription for Augmentin , prednisone  and Diflucan  was sent to the pharmacy for you to begin taking for your sinusitis.  Increase fluids to stay hydrated.  Ice to your knee as needed for discomfort.

## 2024-03-16 ENCOUNTER — Ambulatory Visit (INDEPENDENT_AMBULATORY_CARE_PROVIDER_SITE_OTHER): Admitting: Primary Care

## 2024-03-16 ENCOUNTER — Encounter: Payer: Self-pay | Admitting: Primary Care

## 2024-03-16 VITALS — BP 104/60 | HR 78 | Temp 98.0°F | Ht 65.0 in | Wt 207.0 lb

## 2024-03-16 DIAGNOSIS — E66811 Obesity, class 1: Secondary | ICD-10-CM | POA: Diagnosis not present

## 2024-03-16 DIAGNOSIS — J0130 Acute sphenoidal sinusitis, unspecified: Secondary | ICD-10-CM

## 2024-03-16 DIAGNOSIS — Z6834 Body mass index (BMI) 34.0-34.9, adult: Secondary | ICD-10-CM | POA: Diagnosis not present

## 2024-03-16 DIAGNOSIS — E6609 Other obesity due to excess calories: Secondary | ICD-10-CM

## 2024-03-16 DIAGNOSIS — J323 Chronic sphenoidal sinusitis: Secondary | ICD-10-CM | POA: Insufficient documentation

## 2024-03-16 NOTE — Assessment & Plan Note (Signed)
 Weight gain since discontinuation of Zepbound .  Discussed other options such as Weight watchers.  She will think about resuming Zepbound . She will continue to work with her physical trainer.

## 2024-03-16 NOTE — Patient Instructions (Addendum)
 Continue taking Augmentin  antibiotics twice daily until complete.   Continue prednisone  taper. Take the prednisone  in the morning.  It was a pleasure to see you today!

## 2024-03-16 NOTE — Assessment & Plan Note (Signed)
 With recent ED visit.  ED notes, labs, imaging reviewed.   Continued Augmentin  875-125 mg BID x 10 days. Continue prednisone  taper. Discussed to take in AM.

## 2024-03-16 NOTE — Progress Notes (Signed)
 Subjective:    Patient ID: Amanda Dixon, female    DOB: [birth year], 58 y.o.   MRN: [id number]  History of Present Illness    Amanda Dixon is a very pleasant 58 y.o. female with a history of migraines, hypertension, sleep apnea, seasonal allergies who presents today for ED follow-up. She would also ike to resume Zepbound .  1) Sinusitis: She presented to [facility] ED yesterday for left-sided headaches that began the evening prior.  The following day her headache continued, had to leave work, came home and either had a syncopal episode or got tripped and fell.  She did hit her head, also with right knee abrasion..    During her stay in the ED she underwent CT head and cervical spine which were negative for bleed or fracture, but CT head with an air-fluid level in the left sphenoid sinus.  She underwent lab work which was grossly unremarkable including troponin.  She was suspected to have acute sinusitis so she was provided with Augmentin  and prednisone  prescriptions discharged home later that day.  Since her ED visit she's feeling better. She's compliant to Augmentin  and prednisone  prescriptions. She has experienced insomnia with prednisone , she took her first dose last night. She continues to notice a left occipital headache, took Tylenol /Ibuprofen  combination.    2) Class 1 Obesity: Previously managed on Zepbound , was at 10 mg max at one point but it caused her to feel nauseated and fatigued. She stopped Zepbound  in May 2025 due to these effects.  Since then she's struggled with emotional eating, little motivation to eat a healthier food. She is following with a physical trainer who also provides nutrition advice. She's not sure if she would like to resume.   Wt Readings from Last 3 Encounters:  03/16/24 207 lb (93.9 kg)  03/15/24 207 lb (93.9 kg)  12/04/23 210 lb (95.3 kg)   Body mass index is 34.45 kg/m.   Review of Systems  Constitutional:  Negative for fever.  HENT:  Negative for  congestion, postnasal drip and rhinorrhea.   Neurological:  Positive for headaches. Negative for dizziness.         Past Medical History:  Diagnosis Date   Abnormal craving 09/19/2022   Acute midline low back pain with bilateral sciatica 07/17/2022   Anemia    Anxiety    Asthma    Back pain    Bilateral swelling of feet    Depression    Epistaxis 01/24/2022   Gallbladder problem    GERD (gastroesophageal reflux disease)    High cholesterol    History of swelling of feet    Hypertension    IBS (irritable bowel syndrome)    Joint pain    Obesity    Sinus trouble    Sleep apnea    SOB (shortness of breath)    Vitamin B12 deficiency    Vitamin D  deficiency     Social History   Socioeconomic History   Marital status: Married    Spouse name: Not on file   Number of children: Not on file   Years of education: Not on file   Highest education level: Not on file  Occupational History   Occupation: Research scientist (medical)  Tobacco Use   Smoking status: Never    Passive exposure: Past   Smokeless tobacco: Never   Tobacco comments:    NEVER SMOKER:  EXPOSED TO 2ND(HUSBAND) X SEVERAL YEARS.   Vaping Use   Vaping status: Never Used  Substance and  Sexual Activity   Alcohol use: No   Drug use: No   Sexual activity: Yes    Partners: Male    Birth control/protection: Surgical    Comment: Hysterectomy   Other Topics Concern   Not on file  Social History Narrative   Not on file   Social Drivers of Health   Financial Resource Strain: Not on file  Food Insecurity: Not on file  Transportation Needs: Not on file  Physical Activity: Not on file  Stress: Not on file  Social Connections: Not on file  Intimate Partner Violence: Not on file    Past Surgical History:  Procedure Laterality Date   ABDOMINAL HYSTERECTOMY  2008   supracervical   CESAREAN SECTION  1990   CHOLECYSTECTOMY  1996/97?   TONSILLECTOMY      Family History  Problem Relation Age of Onset    Allergies Mother    High Cholesterol Mother    Obesity Mother    Heart disease Father        multpile bypass   Hypertension Father    High Cholesterol Father    Sudden death Father    Obesity Father    Heart attack Father    Allergies Sister    Diabetes Sister    Asthma Sister    Eczema Sister    Cancer - Cervical Sister    Endometriosis Sister    Heart disease Maternal Grandfather    Emphysema Maternal Grandfather    Asthma Maternal Grandfather    Emphysema Paternal Grandmother    Diabetes Paternal Grandmother    Heart disease Paternal Grandfather    Allergies Son    Asthma Maternal Aunt    Diabetes Paternal Aunt    Diabetes Paternal Uncle    Allergies Other        mothers side   Asthma Other        father side   Breast cancer Neg Hx     Allergies  Allergen Reactions   Other Swelling    Bees/Wasps- redness    Current Outpatient Medications on File Prior to Visit  Medication Sig Dispense Refill   albuterol  (VENTOLIN  HFA) 108 (90 Base) MCG/ACT inhaler TAKE 2 PUFFS BY MOUTH EVERY 6 HOURS AS NEEDED FOR WHEEZE OR SHORTNESS OF BREATH 8.5 each 0   amoxicillin -clavulanate (AUGMENTIN ) 875-125 MG tablet Take 1 tablet by mouth 2 (two) times daily. 20 tablet 0   busPIRone  (BUSPAR ) 5 MG tablet TAKE 1 TABLET (5 MG TOTAL) BY MOUTH 2 (TWO) TIMES DAILY FOR ANXIETY. 180 tablet 1   famotidine  (PEPCID ) 20 MG tablet One at bedtime 30 tablet 11   fluconazole  (DIFLUCAN ) 150 MG tablet Take 1 tablet (150 mg total) by mouth daily. 1 tablet 0   MIEBO 1.338 GM/ML SOLN      mometasone-formoterol  (DULERA) 100-5 MCG/ACT AERO Inhale 2 puffs into the lungs 2 (two) times daily. 13 g 5   PARoxetine  (PAXIL ) 20 MG tablet TAKE 1 TABLET (20 MG TOTAL) BY MOUTH DAILY. FOR ANXIETY 90 tablet 1   predniSONE  (DELTASONE ) 10 MG tablet Take 6 tablets  today, on day 2 take 5 tablets, day 3 take 4 tablets, day 4 take 3 tablets, day 5 take  2 tablets and 1 tablet the last day 21 tablet 0   rosuvastatin  (CRESTOR ) 10  MG tablet TAKE 1 TABLET BY MOUTH EVERY DAY FOR CHOLESTEROL 90 tablet 2   SUMAtriptan  (IMITREX ) 50 MG tablet Take 1 tablet my mouth at migraine onset. May repeat in  2 hours if headache persists or recurs. 10 tablet 0   triamcinolone  (NASACORT  AQ) 55 MCG/ACT nasal inhaler Place 2 sprays into the nose daily. 1 Inhaler 0   acetaminophen  (TYLENOL ) 325 MG tablet Take 650 mg by mouth every 6 (six) hours as needed. (Patient not taking: Reported on 03/16/2024)     cholecalciferol (VITAMIN D3) 25 MCG (1000 UNIT) tablet Take 1,000 Units by mouth daily. (Patient not taking: Reported on 03/16/2024)     ivabradine  (CORLANOR) 5 MG TABS tablet Take tablets (10mg ) TWO hours prior to your cardiac CT scan. (Patient not taking: Reported on 03/16/2024) 2 tablet 0   losartan  (COZAAR ) 25 MG tablet TAKE 1 TABLET BY MOUTH EVERY DAY FOR BLOOD PRESSURE (Patient not taking: Reported on 03/16/2024) 90 tablet 3   metoprolol  tartrate (LOPRESSOR ) 100 MG tablet Take tablet (100mg ) TWO hours prior to your cardiac CT scan. (Patient not taking: Reported on 03/16/2024) 1 tablet 0   potassium chloride  SA (KLOR-CON  M) 20 MEQ tablet Take 1 tablet (20 mEq total) by mouth 2 (two) times daily. (Patient not taking: Reported on 03/16/2024) 14 tablet 0   tirzepatide  (ZEPBOUND ) 10 MG/0.5ML Pen INJECT 10 MG INTO THE SKIN ONE TIME PER WEEK (Patient not taking: Reported on 03/16/2024) 2 mL 0   No current facility-administered medications on file prior to visit.    BP 104/60   Pulse 78   Temp 98 F (36.7 C) (Temporal)   Ht 5' 5 (1.651 m)   Wt 207 lb (93.9 kg)   SpO2 98%   BMI 34.45 kg/m  Objective:   Physical Exam Cardiovascular:     Rate and Rhythm: Normal rate and regular rhythm.  Pulmonary:     Effort: Pulmonary effort is normal.     Breath sounds: Normal breath sounds.  Musculoskeletal:     Cervical back: Neck supple.  Skin:    General: Skin is warm and dry.  Neurological:     Mental Status: She is alert and oriented to person,  place, and time.  Psychiatric:        Mood and Affect: Mood normal.     Physical Exam        Assessment & Plan:  Acute non-recurrent sphenoidal sinusitis Assessment & Plan: With recent ED visit.  ED notes, labs, imaging reviewed.   Continued Augmentin  875-125 mg BID x 10 days. Continue prednisone  taper. Discussed to take in AM.     Class 1 obesity due to excess calories with serious comorbidity and body mass index (BMI) of 34.0 to 34.9 in adult Assessment & Plan: Weight gain since discontinuation of Zepbound .  Discussed other options such as Weight watchers.  She will think about resuming Zepbound . She will continue to work with her physical trainer.      Assessment and Plan Assessment & Plan         Comer MARLA Gaskins, NP

## 2024-03-24 DIAGNOSIS — G4730 Sleep apnea, unspecified: Secondary | ICD-10-CM

## 2024-03-24 DIAGNOSIS — R7303 Prediabetes: Secondary | ICD-10-CM

## 2024-03-24 DIAGNOSIS — I10 Essential (primary) hypertension: Secondary | ICD-10-CM

## 2024-03-24 DIAGNOSIS — E6609 Other obesity due to excess calories: Secondary | ICD-10-CM

## 2024-03-24 DIAGNOSIS — R5382 Chronic fatigue, unspecified: Secondary | ICD-10-CM

## 2024-03-30 MED ORDER — ZEPBOUND 2.5 MG/0.5ML ~~LOC~~ SOAJ: 2.5000 mg | SUBCUTANEOUS | 0 refills | Status: DC

## 2024-04-01 ENCOUNTER — Other Ambulatory Visit (HOSPITAL_COMMUNITY): Payer: Self-pay

## 2024-04-01 ENCOUNTER — Ambulatory Visit: Payer: Self-pay

## 2024-04-01 NOTE — Telephone Encounter (Signed)
 Will see patient then Agree with ER and UC precautions

## 2024-04-01 NOTE — Telephone Encounter (Signed)
 FYI Only or Action Required?: Action required by provider: request for appointment.  Patient was last seen in primary care on 03/16/2024 by Gretta Comer POUR, NP.  Called Nurse Triage reporting Sinusitis.  Symptoms began several weeks ago.  Interventions attempted: Rest, hydration, or home remedies.  Symptoms are: unchanged.  Triage Disposition: See PCP When Office is Open (Within 3 Days)  Patient/caregiver understands and will follow disposition?: YesCopied from CRM #[id number]. Topic: Clinical - Red Word Triage >> Apr 01, 2024  1:58 PM Jayma L wrote: Red Word that prompted transfer to Nurse Triage: follow up for sinus infection , finished round of antibiotics and has sinus drainage and headaches with coughing up green phlegm and has green mucous .SABRA Not getting any better since the medicine Reason for Disposition  [1] Sinus congestion (pressure, fullness) AND [2] present > 10 days  Answer Assessment - Initial Assessment Questions Pt took last dose of abx Tuesday and still has symptoms.     1. LOCATION: Where does it hurt?      Face/ 2. ONSET: When did the sinus pain start?  (e.g., hours, days)      8/19 3. SEVERITY: How bad is the pain?   (Scale 0-10; or none, mild, moderate or severe)     3 4. RECURRENT SYMPTOM: Have you ever had sinus problems before? If Yes, ask: When was the last time? and What happened that time?      yes 5. NASAL CONGESTION: Is the nose blocked? If Yes, ask: Can you open it or must you breathe through your mouth?     At times 6. NASAL DISCHARGE: Do you have discharge from your nose? If so ask, What color?     green 7. FEVER: Do you have a fever? If Yes, ask: What is it, how was it measured, and when did it start?      Not sure 8. OTHER SYMPTOMS: Do you have any other symptoms? (e.g., sore throat, cough, earache, difficulty breathing)     Cough-green mucus, headache,  Protocols used: Sinus Pain or Congestion-A-AH

## 2024-04-01 NOTE — Telephone Encounter (Signed)
Noted and appreciate Dr Tower's evaluation.  

## 2024-04-02 ENCOUNTER — Ambulatory Visit: Admitting: Family Medicine

## 2024-04-06 ENCOUNTER — Other Ambulatory Visit (HOSPITAL_COMMUNITY): Payer: Self-pay

## 2024-04-29 ENCOUNTER — Other Ambulatory Visit: Payer: Self-pay | Admitting: Otolaryngology

## 2024-04-29 DIAGNOSIS — R55 Syncope and collapse: Secondary | ICD-10-CM

## 2024-04-29 DIAGNOSIS — R42 Dizziness and giddiness: Secondary | ICD-10-CM

## 2024-05-04 ENCOUNTER — Encounter: Payer: Self-pay | Admitting: Otolaryngology

## 2024-05-04 DIAGNOSIS — R7303 Prediabetes: Secondary | ICD-10-CM

## 2024-05-04 DIAGNOSIS — G4730 Sleep apnea, unspecified: Secondary | ICD-10-CM

## 2024-05-04 DIAGNOSIS — I10 Essential (primary) hypertension: Secondary | ICD-10-CM

## 2024-05-04 DIAGNOSIS — E66811 Obesity, class 1: Secondary | ICD-10-CM

## 2024-05-05 ENCOUNTER — Other Ambulatory Visit: Payer: Self-pay

## 2024-05-05 ENCOUNTER — Other Ambulatory Visit (HOSPITAL_COMMUNITY): Payer: Self-pay

## 2024-05-05 ENCOUNTER — Telehealth: Payer: Self-pay

## 2024-05-05 DIAGNOSIS — R7303 Prediabetes: Secondary | ICD-10-CM

## 2024-05-05 DIAGNOSIS — E6609 Other obesity due to excess calories: Secondary | ICD-10-CM

## 2024-05-05 DIAGNOSIS — R5382 Chronic fatigue, unspecified: Secondary | ICD-10-CM

## 2024-05-05 DIAGNOSIS — I10 Essential (primary) hypertension: Secondary | ICD-10-CM

## 2024-05-05 DIAGNOSIS — G4730 Sleep apnea, unspecified: Secondary | ICD-10-CM

## 2024-05-05 MED ORDER — ZEPBOUND 2.5 MG/0.5ML ~~LOC~~ SOAJ
2.5000 mg | SUBCUTANEOUS | 0 refills | Status: DC
Start: 2024-05-05 — End: 2024-05-26
  Filled 2024-05-05: qty 2, 28d supply, fill #0

## 2024-05-05 NOTE — Telephone Encounter (Signed)
 Can we check on the PA for her Zepbound ? Rx was sent on 03/30/24

## 2024-05-05 NOTE — Telephone Encounter (Signed)
 Pt notified through mychart in separate encounter.

## 2024-05-05 NOTE — Telephone Encounter (Signed)
 Pharmacy Patient Advocate Encounter   Received notification from Pt Calls Messages that prior authorization for Zepbound  2.5 is required/requested.   Insurance verification completed.   The patient is insured through [employer].   Per test claim: PA required; PA submitted to above mentioned insurance via Latent Key/confirmation #/EOC [id number] Status is pending

## 2024-05-05 NOTE — Telephone Encounter (Signed)
 Pharmacy Patient Advocate Encounter  Received notification from [facility] that Prior Authorization for Zepbound  2.5 has been APPROVED from 04/05/24 to 12/31/24. Ran test claim, Copay is $0.00. This test claim was processed through [facility]- copay amounts may vary at other pharmacies due to pharmacy/plan contracts, or as the patient moves through the different stages of their insurance plan.   PA #/Case ID/Reference #: # [id number]

## 2024-05-14 ENCOUNTER — Ambulatory Visit
Admission: RE | Admit: 2024-05-14 | Discharge: 2024-05-14 | Disposition: A | Source: Ambulatory Visit | Attending: Otolaryngology | Admitting: Otolaryngology

## 2024-05-14 DIAGNOSIS — R42 Dizziness and giddiness: Secondary | ICD-10-CM

## 2024-05-14 DIAGNOSIS — R55 Syncope and collapse: Secondary | ICD-10-CM

## 2024-05-14 MED ORDER — GADOPICLENOL 0.5 MMOL/ML IV SOLN
10.0000 mL | Freq: Once | INTRAVENOUS | Status: AC | PRN
  Administered 2024-05-14: 10 mL via INTRAVENOUS

## 2024-05-26 ENCOUNTER — Other Ambulatory Visit: Payer: Self-pay

## 2024-05-26 MED ORDER — ZEPBOUND 5 MG/0.5ML ~~LOC~~ SOAJ
5.0000 mg | SUBCUTANEOUS | 0 refills | Status: DC
  Filled 2024-05-26: qty 2, 28d supply, fill #0

## 2024-06-18 DIAGNOSIS — R7303 Prediabetes: Secondary | ICD-10-CM

## 2024-06-18 DIAGNOSIS — E6609 Other obesity due to excess calories: Secondary | ICD-10-CM

## 2024-06-18 DIAGNOSIS — G4730 Sleep apnea, unspecified: Secondary | ICD-10-CM

## 2024-06-18 DIAGNOSIS — R5382 Chronic fatigue, unspecified: Secondary | ICD-10-CM

## 2024-06-18 DIAGNOSIS — I10 Essential (primary) hypertension: Secondary | ICD-10-CM

## 2024-06-30 ENCOUNTER — Other Ambulatory Visit: Payer: Self-pay

## 2024-06-30 MED ORDER — ZEPBOUND 7.5 MG/0.5ML ~~LOC~~ SOAJ
7.5000 mg | SUBCUTANEOUS | 0 refills | Status: AC
  Filled 2024-06-30: qty 2, 28d supply, fill #0

## 2024-07-05 ENCOUNTER — Other Ambulatory Visit: Payer: Self-pay

## 2024-07-08 ENCOUNTER — Other Ambulatory Visit: Payer: Self-pay

## 2024-07-08 MED ORDER — ZEPBOUND 7.5 MG/0.5ML ~~LOC~~ SOAJ
7.5000 mg | SUBCUTANEOUS | 0 refills | Status: DC
  Filled 2024-07-08 – 2024-07-26 (×3): qty 2, 28d supply, fill #0

## 2024-07-18 ENCOUNTER — Other Ambulatory Visit: Payer: Self-pay

## 2024-07-20 DIAGNOSIS — Z20828 Contact with and (suspected) exposure to other viral communicable diseases: Secondary | ICD-10-CM

## 2024-07-21 MED ORDER — OSELTAMIVIR PHOSPHATE 75 MG PO CAPS: 75.0000 mg | ORAL_CAPSULE | Freq: Every day | ORAL | 0 refills | Status: AC

## 2024-07-21 NOTE — Addendum Note (Signed)
 Addended by: Damya Comley K on: 07/21/2024 01:20 PM   Modules accepted: Orders

## 2024-07-27 ENCOUNTER — Other Ambulatory Visit: Payer: Self-pay

## 2024-08-05 ENCOUNTER — Encounter: Payer: Self-pay | Admitting: Primary Care

## 2024-08-05 ENCOUNTER — Ambulatory Visit: Payer: Self-pay | Admitting: Primary Care

## 2024-08-05 ENCOUNTER — Ambulatory Visit (INDEPENDENT_AMBULATORY_CARE_PROVIDER_SITE_OTHER): Admitting: Primary Care

## 2024-08-05 VITALS — BP 124/84 | HR 82 | Temp 98.1°F | Ht 65.0 in | Wt 174.2 lb

## 2024-08-05 DIAGNOSIS — Z1231 Encounter for screening mammogram for malignant neoplasm of breast: Secondary | ICD-10-CM

## 2024-08-05 DIAGNOSIS — Z6829 Body mass index (BMI) 29.0-29.9, adult: Secondary | ICD-10-CM

## 2024-08-05 DIAGNOSIS — G4730 Sleep apnea, unspecified: Secondary | ICD-10-CM | POA: Diagnosis not present

## 2024-08-05 DIAGNOSIS — F3289 Other specified depressive episodes: Secondary | ICD-10-CM

## 2024-08-05 DIAGNOSIS — R7303 Prediabetes: Secondary | ICD-10-CM | POA: Diagnosis not present

## 2024-08-05 DIAGNOSIS — Z0001 Encounter for general adult medical examination with abnormal findings: Secondary | ICD-10-CM | POA: Insufficient documentation

## 2024-08-05 DIAGNOSIS — F411 Generalized anxiety disorder: Secondary | ICD-10-CM

## 2024-08-05 DIAGNOSIS — E785 Hyperlipidemia, unspecified: Secondary | ICD-10-CM | POA: Diagnosis not present

## 2024-08-05 DIAGNOSIS — E663 Overweight: Secondary | ICD-10-CM

## 2024-08-05 DIAGNOSIS — J4530 Mild persistent asthma, uncomplicated: Secondary | ICD-10-CM | POA: Diagnosis not present

## 2024-08-05 DIAGNOSIS — I10 Essential (primary) hypertension: Secondary | ICD-10-CM

## 2024-08-05 DIAGNOSIS — G43709 Chronic migraine without aura, not intractable, without status migrainosus: Secondary | ICD-10-CM | POA: Diagnosis not present

## 2024-08-05 LAB — COMPREHENSIVE METABOLIC PANEL WITH GFR
ALT: 18 U/L (ref 3–35)
AST: 19 U/L (ref 5–37)
Albumin: 4.2 g/dL (ref 3.5–5.2)
Alkaline Phosphatase: 53 U/L (ref 39–117)
BUN: 10 mg/dL (ref 6–23)
CO2: 33 meq/L — ABNORMAL HIGH (ref 19–32)
Calcium: 9.2 mg/dL (ref 8.4–10.5)
Chloride: 104 meq/L (ref 96–112)
Creatinine, Ser: 0.7 mg/dL (ref 0.40–1.20)
GFR: 95.17 mL/min
Glucose, Bld: 97 mg/dL (ref 70–99)
Potassium: 4.2 meq/L (ref 3.5–5.1)
Sodium: 142 meq/L (ref 135–145)
Total Bilirubin: 0.5 mg/dL (ref 0.2–1.2)
Total Protein: 6.6 g/dL (ref 6.0–8.3)

## 2024-08-05 LAB — LIPID PANEL
Cholesterol: 203 mg/dL — ABNORMAL HIGH (ref 28–200)
HDL: 39.9 mg/dL
LDL Cholesterol: 135 mg/dL — ABNORMAL HIGH (ref 10–99)
NonHDL: 163.55
Total CHOL/HDL Ratio: 5
Triglycerides: 143 mg/dL (ref 10.0–149.0)
VLDL: 28.6 mg/dL (ref 0.0–40.0)

## 2024-08-05 LAB — HEMOGLOBIN A1C: Hgb A1c MFr Bld: 5.4 % (ref 4.6–6.5)

## 2024-08-05 NOTE — Assessment & Plan Note (Signed)
 Inconsistent use.   Discussed importance of nightly compliance.

## 2024-08-05 NOTE — Assessment & Plan Note (Signed)
 Declines Shingrix and influenza vaccines.  Mammogram due, orders placed. Colonoscopy UTD, due May 2027?  Discussed the importance of a healthy diet and regular exercise in order for weight loss, and to reduce the risk of further co-morbidity.  Exam stable. Labs pending.  Follow up in 1 year for repeat physical.

## 2024-08-05 NOTE — Assessment & Plan Note (Signed)
 Situational due to her responsibilities with her mother.  Discussed to increase Paxil  dose to 40 mg for which she kindly declines. Discussed addition of Wellbutrin XL 150 mg for which she kindly declines.  She will resume her BuSpar  5 mg twice daily. Continue with therapy.  She will update if no improvement.

## 2024-08-05 NOTE — Assessment & Plan Note (Signed)
 Controlled.  Continue off treatment for now.  Reviewed cardiology notes from May 2025.

## 2024-08-05 NOTE — Assessment & Plan Note (Signed)
 Stress induced due to her responsibilities with her elderly parents.  She will resume Buspar  5 mg BID. Continue Paxil  20 mg daily.  Continue with therapy.

## 2024-08-05 NOTE — Assessment & Plan Note (Signed)
 Commended her on weight loss!  Continue Zepbound  7.5 mg weekly

## 2024-08-05 NOTE — Assessment & Plan Note (Signed)
Commended her on weight loss!  Repeat A1C pending. 

## 2024-08-05 NOTE — Assessment & Plan Note (Addendum)
 Repeat lipid panel pending.  Work on a healthy diet and regular exercise in order for weight loss, and to reduce the risk of further co-morbidity. No longer on rosuvastatin , unsure why.

## 2024-08-05 NOTE — Assessment & Plan Note (Signed)
Controlled.  Continue sumatriptan 50 mg PRN.  

## 2024-08-05 NOTE — Progress Notes (Signed)
 "  Subjective:    Patient ID: Amanda Dixon, female    DOB: 1966/06/28, 59 y.o.   MRN: [id number]  Amanda Dixon is a very pleasant 59 y.o. female who presents today for complete physical and follow up of chronic conditions.  She would also like to discuss depression. Her family believes she has uncontrolled depression for symptoms of procrastination, little motivation to do anything, fatigue, wanting to sleep all the time. She is managed on Paxil  20 mg daily and Buspar  5 mg BID. She meets with therapy who thinks she is ADHD. She has not been taking her Buspar  recently.  Immunizations: -Tetanus: Completed in 2020 -Influenza: Declines -Shingles: Never completed, declines    Diet: Fair diet.  Exercise: Regular exercise.  Eye exam: Completes annually  Dental exam: Completes semi-annually    Pap Smear: Hysterectomy  Mammogram: Completed in 2024  Colonoscopy: Completed around the age of 41 per patient, she was told to repeat in 10 years (age 24). We have no records.   BP Readings from Last 3 Encounters:  08/05/24 124/84  03/16/24 104/60  03/15/24 (!) 148/88    Wt Readings from Last 3 Encounters:  08/05/24 174 lb 4 oz (79 kg)  03/16/24 207 lb (93.9 kg)  03/15/24 207 lb (93.9 kg)       Review of Systems  Constitutional:  Negative for unexpected weight change.  HENT:  Negative for rhinorrhea.   Respiratory:  Negative for cough and shortness of breath.   Cardiovascular:  Negative for chest pain.  Gastrointestinal:  Negative for constipation and diarrhea.  Genitourinary:  Negative for difficulty urinating.  Musculoskeletal:  Negative for arthralgias and myalgias.  Skin:  Negative for rash.  Allergic/Immunologic: Negative for environmental allergies.  Neurological:  Negative for dizziness and headaches.  Psychiatric/Behavioral:         See HPI         Past Medical History:  Diagnosis Date   Abnormal craving 09/19/2022   Acute midline low back pain with bilateral  sciatica 07/17/2022   Anemia    Anxiety    Asthma    Back pain    Bilateral swelling of feet    Chronic cough 02/22/2013   Allergy  profile 12/19/16  >  Eos 0.2 /  IgE  143 RAST neg   - allergy  skin testing ([facility]) 02/18/2017  Neg   - cyclical cough rx 03/04/2018 : unable to do tyl #3 knocked me out   - Sinus CT 03/12/2018  No significant findings   - FENO 04/01/2018  =  14   - Spirometry 04/01/2018  FEV1 3.0 (105%)  Ratio 96 p nothing prior   - 04/01/2018  rec  Gabapentin  100 mg four times a day and refer to Dr Tommi wfu      Depression    Epistaxis 01/24/2022   Gallbladder problem    GERD (gastroesophageal reflux disease)    High cholesterol    History of swelling of feet    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   Hypertension    IBS (irritable bowel syndrome)    Joint pain    Obesity    Sinus trouble    Sleep apnea    SOB (shortness of breath)    Tinea cruris 11/21/2017   Vitamin B12 deficiency    Vitamin D  deficiency     Social History   Socioeconomic History   Marital status: Married    Spouse name: Not on file   Number of children: Not on file  Years of education: Not on file   Highest education level: Not on file  Occupational History   Occupation: research scientist (medical)  Tobacco Use   Smoking status: Never    Passive exposure: Past   Smokeless tobacco: Never   Tobacco comments:    NEVER SMOKER:  EXPOSED TO 2ND(HUSBAND) X SEVERAL YEARS.   Vaping Use   Vaping status: Never Used  Substance and Sexual Activity   Alcohol use: No   Drug use: No   Sexual activity: Yes    Partners: Male    Birth control/protection: Surgical    Comment: Hysterectomy   Other Topics Concern   Not on file  Social History Narrative   Not on file   Social Drivers of Health   Tobacco Use: Low Risk (08/05/2024)   Patient History    Smoking Tobacco Use: Never    Smokeless Tobacco Use: Never    Passive Exposure: Past  Financial Resource Strain: Not on file  Food Insecurity: Not on file   Transportation Needs: Not on file  Physical Activity: Not on file  Stress: Not on file  Social Connections: Not on file  Intimate Partner Violence: Not on file  Depression (PHQ2-9): Medium Risk (08/05/2024)   Depression (PHQ2-9)    PHQ-2 Score: 7  Alcohol Screen: Not on file  Housing: Unknown (11/05/2023)   Received from [facility]   Epic    Unable to Pay for Housing in the Last Year: Not on file    Number of Times Moved in the Last Year: Not on file    At any time in the past 12 months, were you homeless or living in a shelter (including now)?: No  Utilities: Not on file  Health Literacy: Not on file    Past Surgical History:  Procedure Laterality Date   ABDOMINAL HYSTERECTOMY  2008   supracervical   CESAREAN SECTION  1990   CHOLECYSTECTOMY  1996/97?   TONSILLECTOMY      Family History  Problem Relation Age of Onset   Allergies Mother    High Cholesterol Mother    Obesity Mother    Heart disease Father        multpile bypass   Hypertension Father    High Cholesterol Father    Sudden death Father    Obesity Father    Heart attack Father    Allergies Sister    Diabetes Sister    Asthma Sister    Eczema Sister    Cancer - Cervical Sister    Endometriosis Sister    Heart disease Maternal Grandfather    Emphysema Maternal Grandfather    Asthma Maternal Grandfather    Emphysema Paternal Grandmother    Diabetes Paternal Grandmother    Heart disease Paternal Grandfather    Allergies Son    Asthma Maternal Aunt    Diabetes Paternal Aunt    Diabetes Paternal Uncle    Allergies Other        mothers side   Asthma Other        father side   Breast cancer Neg Hx     Allergies[1]  Medications Ordered Prior to Encounter[2]  BP 124/84   Pulse 82   Temp 98.1 F (36.7 C) (Oral)   Ht 5' 5 (1.651 m)   Wt 174 lb 4 oz (79 kg)   SpO2 97%   BMI 29.00 kg/m  Objective:   Physical Exam HENT:     Right Ear: Tympanic membrane and ear canal  normal.      Left Ear: Tympanic membrane and ear canal normal.  Eyes:     Pupils: Pupils are equal, round, and reactive to light.  Cardiovascular:     Rate and Rhythm: Normal rate and regular rhythm.  Pulmonary:     Effort: Pulmonary effort is normal.     Breath sounds: Normal breath sounds.  Abdominal:     General: Bowel sounds are normal.     Palpations: Abdomen is soft.     Tenderness: There is no abdominal tenderness.  Musculoskeletal:        General: Normal range of motion.     Cervical back: Neck supple.  Skin:    General: Skin is warm and dry.  Neurological:     Mental Status: She is alert and oriented to person, place, and time.     Cranial Nerves: No cranial nerve deficit.     Deep Tendon Reflexes:     Reflex Scores:      Patellar reflexes are 2+ on the right side and 2+ on the left side. Psychiatric:        Mood and Affect: Mood normal.     Physical Exam        Assessment & Plan:  Encounter for annual general medical examination with abnormal findings in adult Assessment & Plan: Declines Shingrix and influenza vaccines.  Mammogram due, orders placed. Colonoscopy UTD, due May 2027?  Discussed the importance of a healthy diet and regular exercise in order for weight loss, and to reduce the risk of further co-morbidity.  Exam stable. Labs pending.  Follow up in 1 year for repeat physical.    Screening mammogram for breast cancer -     3D Screening Mammogram, Left and Right; Future  Primary hypertension Assessment & Plan: Controlled.  Continue off treatment for now.  Reviewed cardiology notes from May 2025.   Chronic migraine without aura without status migrainosus, not intractable Assessment & Plan: Controlled.  Continue sumatriptan  50 mg PRN.   Severe sleep apnea Assessment & Plan: Inconsistent use.   Discussed importance of nightly compliance.    Mild persistent asthma without complication Assessment & Plan: Stable.  Continue Dulera  100-5 mcg, 2 puffs BID Continue albuterol  inhaler PRN.   Prediabetes Assessment & Plan: Commended her on weight loss!  Repeat A1C pending.  Orders: -     Hemoglobin A1c -     Comprehensive metabolic panel with GFR  Hyperlipidemia, unspecified hyperlipidemia type Assessment & Plan: Repeat lipid panel pending.  Work on a healthy diet and regular exercise in order for weight loss, and to reduce the risk of further co-morbidity. No longer on rosuvastatin , unsure why.     Orders: -     Lipid panel -     Comprehensive metabolic panel with GFR  Overweight with body mass index (BMI) of 29 to 29.9 in adult Assessment & Plan: Commended her on weight loss!  Continue Zepbound  7.5 mg weekly   GAD (generalized anxiety disorder) Assessment & Plan: Stress induced due to her responsibilities with her elderly parents.  She will resume Buspar  5 mg BID. Continue Paxil  20 mg daily.  Continue with therapy.   Other depression Assessment & Plan: Situational due to her responsibilities with her mother.  Discussed to increase Paxil  dose to 40 mg for which she kindly declines. Discussed addition of Wellbutrin XL 150 mg for which she kindly declines.  She will resume her BuSpar  5 mg twice daily. Continue with therapy.  She  will update if no improvement.     Assessment and Plan Assessment & Plan         Comer MARLA Gaskins, NP       [1]  Allergies Allergen Reactions   Other Swelling    Bees/Wasps- redness  [2]  Current Outpatient Medications on File Prior to Visit  Medication Sig Dispense Refill   albuterol  (VENTOLIN  HFA) 108 (90 Base) MCG/ACT inhaler TAKE 2 PUFFS BY MOUTH EVERY 6 HOURS AS NEEDED FOR WHEEZE OR SHORTNESS OF BREATH 8.5 each 0   busPIRone  (BUSPAR ) 5 MG tablet TAKE 1 TABLET (5 MG TOTAL) BY MOUTH 2 (TWO) TIMES DAILY FOR ANXIETY. 180 tablet 1   famotidine  (PEPCID ) 20 MG tablet One at bedtime 30 tablet 11   MIEBO 1.338 GM/ML SOLN       mometasone-formoterol  (DULERA) 100-5 MCG/ACT AERO Inhale 2 puffs into the lungs 2 (two) times daily. 13 g 5   PARoxetine  (PAXIL ) 20 MG tablet TAKE 1 TABLET (20 MG TOTAL) BY MOUTH DAILY. FOR ANXIETY 90 tablet 1   rosuvastatin  (CRESTOR ) 10 MG tablet TAKE 1 TABLET BY MOUTH EVERY DAY FOR CHOLESTEROL 90 tablet 2   SUMAtriptan  (IMITREX ) 50 MG tablet Take 1 tablet my mouth at migraine onset. May repeat in 2 hours if headache persists or recurs. 10 tablet 0   tirzepatide  (ZEPBOUND ) 7.5 MG/0.5ML Pen Inject 7.5 mg into the skin once a week. 2 mL 0   triamcinolone  (NASACORT  AQ) 55 MCG/ACT nasal inhaler Place 2 sprays into the nose daily. 1 Inhaler 0   No current facility-administered medications on file prior to visit.   "

## 2024-08-05 NOTE — Assessment & Plan Note (Signed)
 Stable.  Continue Dulera 100-5 mcg, 2 puffs BID Continue albuterol  inhaler PRN.

## 2024-08-05 NOTE — Patient Instructions (Signed)
 Stop by the lab prior to leaving today. I will notify you of your results once received.   Start taking buspirone  5 mg twice daily for anxiety.  Please update me if no improvement.  [facility].   It was a pleasure to see you today!

## 2024-08-06 DIAGNOSIS — F411 Generalized anxiety disorder: Secondary | ICD-10-CM

## 2024-08-06 MED ORDER — BUSPIRONE HCL 5 MG PO TABS: 5.0000 mg | ORAL_TABLET | Freq: Two times a day (BID) | ORAL | 1 refills | Status: AC

## 2024-08-21 ENCOUNTER — Other Ambulatory Visit: Payer: Self-pay | Admitting: Primary Care

## 2024-08-21 DIAGNOSIS — F411 Generalized anxiety disorder: Secondary | ICD-10-CM

## 2024-08-26 ENCOUNTER — Other Ambulatory Visit: Payer: Self-pay | Admitting: Primary Care

## 2024-08-26 DIAGNOSIS — R7303 Prediabetes: Secondary | ICD-10-CM

## 2024-08-26 DIAGNOSIS — I10 Essential (primary) hypertension: Secondary | ICD-10-CM

## 2024-08-26 DIAGNOSIS — R5382 Chronic fatigue, unspecified: Secondary | ICD-10-CM

## 2024-08-26 DIAGNOSIS — E66811 Obesity, class 1: Secondary | ICD-10-CM

## 2024-08-26 DIAGNOSIS — G4730 Sleep apnea, unspecified: Secondary | ICD-10-CM

## 2024-08-27 ENCOUNTER — Other Ambulatory Visit: Payer: Self-pay

## 2024-08-27 MED ORDER — ZEPBOUND 7.5 MG/0.5ML ~~LOC~~ SOAJ
7.5000 mg | SUBCUTANEOUS | 0 refills | Status: AC
  Filled 2024-08-27: qty 2, 28d supply, fill #0

## 2024-09-02 ENCOUNTER — Ambulatory Visit
Admission: RE | Admit: 2024-09-02 | Discharge: 2024-09-02 | Disposition: A | Source: Ambulatory Visit | Attending: Primary Care

## 2024-09-02 DIAGNOSIS — Z1231 Encounter for screening mammogram for malignant neoplasm of breast: Secondary | ICD-10-CM
